# Patient Record
Sex: Female | Born: 1945 | Race: White | Hispanic: No | Marital: Married | Smoking: Never smoker
Health system: Southern US, Community
[De-identification: ages and names within clinical notes are randomized; demographics above are authoritative.]

## PROBLEM LIST (undated history)

## (undated) DIAGNOSIS — I509 Heart failure, unspecified: Secondary | ICD-10-CM

## (undated) DIAGNOSIS — F329 Major depressive disorder, single episode, unspecified: Secondary | ICD-10-CM

## (undated) DIAGNOSIS — E119 Type 2 diabetes mellitus without complications: Secondary | ICD-10-CM

## (undated) DIAGNOSIS — G4730 Sleep apnea, unspecified: Secondary | ICD-10-CM

## (undated) DIAGNOSIS — J449 Chronic obstructive pulmonary disease, unspecified: Secondary | ICD-10-CM

## (undated) DIAGNOSIS — I10 Essential (primary) hypertension: Secondary | ICD-10-CM

## (undated) DIAGNOSIS — F32A Depression, unspecified: Secondary | ICD-10-CM

## (undated) DIAGNOSIS — M1990 Unspecified osteoarthritis, unspecified site: Secondary | ICD-10-CM

## (undated) HISTORY — PX: LAMINECTOMY: SHX219

## (undated) HISTORY — PX: BACK SURGERY: SHX140

---

## 2005-06-09 ENCOUNTER — Inpatient Hospital Stay: Payer: Self-pay | Admitting: Internal Medicine

## 2005-06-15 ENCOUNTER — Other Ambulatory Visit: Payer: Self-pay

## 2005-07-07 ENCOUNTER — Ambulatory Visit: Payer: Self-pay | Admitting: Anesthesiology

## 2011-08-03 ENCOUNTER — Inpatient Hospital Stay: Payer: Self-pay | Admitting: Internal Medicine

## 2011-08-03 LAB — SEDIMENTATION RATE: Erythrocyte Sed Rate: 47 mm/hr — ABNORMAL HIGH (ref 0–30)

## 2011-08-03 LAB — CBC WITH DIFFERENTIAL/PLATELET
Basophil #: 0 10*3/uL (ref 0.0–0.1)
Eosinophil %: 0.5 %
HGB: 11.3 g/dL — ABNORMAL LOW (ref 12.0–16.0)
MCHC: 31.7 g/dL — ABNORMAL LOW (ref 32.0–36.0)
MCV: 89 fL (ref 80–100)
Monocyte #: 0.9 x10 3/mm (ref 0.2–0.9)
Neutrophil #: 15.9 10*3/uL — ABNORMAL HIGH (ref 1.4–6.5)
Neutrophil %: 90.8 %

## 2011-08-03 LAB — COMPREHENSIVE METABOLIC PANEL
Albumin: 2.4 g/dL — ABNORMAL LOW (ref 3.4–5.0)
Alkaline Phosphatase: 123 U/L (ref 50–136)
Anion Gap: 10 (ref 7–16)
BUN: 68 mg/dL — ABNORMAL HIGH (ref 7–18)
Bilirubin,Total: 0.7 mg/dL (ref 0.2–1.0)
Chloride: 104 mmol/L (ref 98–107)
EGFR (Non-African Amer.): 10 — ABNORMAL LOW
Glucose: 138 mg/dL — ABNORMAL HIGH (ref 65–99)
Osmolality: 299 (ref 275–301)
Potassium: 5 mmol/L (ref 3.5–5.1)
Sodium: 139 mmol/L (ref 136–145)

## 2011-08-04 LAB — CBC WITH DIFFERENTIAL/PLATELET
Eosinophil #: 0.2 10*3/uL (ref 0.0–0.7)
Eosinophil %: 1.4 %
HGB: 9.6 g/dL — ABNORMAL LOW (ref 12.0–16.0)
Lymphocyte #: 0.9 10*3/uL — ABNORMAL LOW (ref 1.0–3.6)
MCH: 28 pg (ref 26.0–34.0)
MCV: 88 fL (ref 80–100)
Neutrophil #: 12.1 10*3/uL — ABNORMAL HIGH (ref 1.4–6.5)
Neutrophil %: 86.7 %
Platelet: 103 10*3/uL — ABNORMAL LOW (ref 150–440)
RBC: 3.43 10*6/uL — ABNORMAL LOW (ref 3.80–5.20)
RDW: 16 % — ABNORMAL HIGH (ref 11.5–14.5)

## 2011-08-04 LAB — URINALYSIS, COMPLETE
Bilirubin,UR: NEGATIVE
Glucose,UR: NEGATIVE mg/dL (ref 0–75)
Nitrite: NEGATIVE
Ph: 5 (ref 4.5–8.0)
Squamous Epithelial: 7

## 2011-08-04 LAB — LIPID PANEL
Cholesterol: 69 mg/dL (ref 0–200)
HDL Cholesterol: 12 mg/dL — ABNORMAL LOW (ref 40–60)
VLDL Cholesterol, Calc: 20 mg/dL (ref 5–40)

## 2011-08-04 LAB — BASIC METABOLIC PANEL
BUN: 80 mg/dL — ABNORMAL HIGH (ref 7–18)
Calcium, Total: 8 mg/dL — ABNORMAL LOW (ref 8.5–10.1)
Co2: 26 mmol/L (ref 21–32)
EGFR (African American): 10 — ABNORMAL LOW
Osmolality: 302 (ref 275–301)
Potassium: 5 mmol/L (ref 3.5–5.1)

## 2011-08-05 LAB — CBC WITH DIFFERENTIAL/PLATELET
Basophil #: 0 10*3/uL (ref 0.0–0.1)
Basophil %: 0.1 %
HCT: 30.5 % — ABNORMAL LOW (ref 35.0–47.0)
Lymphocyte %: 6.7 %
MCHC: 32.1 g/dL (ref 32.0–36.0)
Monocyte %: 7.4 %
Neutrophil #: 12.6 10*3/uL — ABNORMAL HIGH (ref 1.4–6.5)
Platelet: 123 10*3/uL — ABNORMAL LOW (ref 150–440)
RBC: 3.51 10*6/uL — ABNORMAL LOW (ref 3.80–5.20)
RDW: 16.1 % — ABNORMAL HIGH (ref 11.5–14.5)
WBC: 14.8 10*3/uL — ABNORMAL HIGH (ref 3.6–11.0)

## 2011-08-05 LAB — BASIC METABOLIC PANEL
EGFR (Non-African Amer.): 11 — ABNORMAL LOW
Glucose: 109 mg/dL — ABNORMAL HIGH (ref 65–99)
Osmolality: 301 (ref 275–301)
Potassium: 5.1 mmol/L (ref 3.5–5.1)

## 2011-08-05 LAB — PROTEIN ELECTROPHORESIS(ARMC)

## 2011-08-06 LAB — BASIC METABOLIC PANEL
Anion Gap: 9 (ref 7–16)
BUN: 61 mg/dL — ABNORMAL HIGH (ref 7–18)
Creatinine: 2.32 mg/dL — ABNORMAL HIGH (ref 0.60–1.30)
EGFR (African American): 24 — ABNORMAL LOW
EGFR (Non-African Amer.): 21 — ABNORMAL LOW
Osmolality: 301 (ref 275–301)

## 2011-08-07 LAB — URINALYSIS, COMPLETE
Ketone: NEGATIVE
Nitrite: NEGATIVE
Ph: 5 (ref 4.5–8.0)
Protein: 30
Specific Gravity: 1.014 (ref 1.003–1.030)
Squamous Epithelial: 3

## 2011-08-07 LAB — CBC WITH DIFFERENTIAL/PLATELET
Basophil #: 0 10*3/uL (ref 0.0–0.1)
Basophil %: 0.2 %
Eosinophil #: 0.1 10*3/uL (ref 0.0–0.7)
Eosinophil %: 1.3 %
HCT: 31.4 % — ABNORMAL LOW (ref 35.0–47.0)
Lymphocyte %: 9.2 %
MCH: 28.1 pg (ref 26.0–34.0)
MCHC: 32.5 g/dL (ref 32.0–36.0)
Monocyte %: 9.1 %
Neutrophil #: 8.5 10*3/uL — ABNORMAL HIGH (ref 1.4–6.5)
Neutrophil %: 80.2 %
Platelet: 164 10*3/uL (ref 150–440)
RDW: 16.2 % — ABNORMAL HIGH (ref 11.5–14.5)

## 2011-08-07 LAB — BASIC METABOLIC PANEL
BUN: 40 mg/dL — ABNORMAL HIGH (ref 7–18)
Chloride: 109 mmol/L — ABNORMAL HIGH (ref 98–107)
Creatinine: 1.55 mg/dL — ABNORMAL HIGH (ref 0.60–1.30)
EGFR (African American): 40 — ABNORMAL LOW
EGFR (Non-African Amer.): 34 — ABNORMAL LOW
Sodium: 143 mmol/L (ref 136–145)

## 2011-08-08 LAB — BASIC METABOLIC PANEL
Anion Gap: 7 (ref 7–16)
BUN: 27 mg/dL — ABNORMAL HIGH (ref 7–18)
Chloride: 108 mmol/L — ABNORMAL HIGH (ref 98–107)
EGFR (Non-African Amer.): 42 — ABNORMAL LOW
Glucose: 122 mg/dL — ABNORMAL HIGH (ref 65–99)
Osmolality: 293 (ref 275–301)
Potassium: 4.9 mmol/L (ref 3.5–5.1)

## 2011-08-09 LAB — URINE CULTURE

## 2011-08-10 LAB — BASIC METABOLIC PANEL
Anion Gap: 5 — ABNORMAL LOW (ref 7–16)
Calcium, Total: 7.9 mg/dL — ABNORMAL LOW (ref 8.5–10.1)
Chloride: 104 mmol/L (ref 98–107)
Creatinine: 1.02 mg/dL (ref 0.60–1.30)
EGFR (African American): >60
EGFR (Non-African Amer.): 57 — ABNORMAL LOW
Potassium: 4.6 mmol/L (ref 3.5–5.1)
Sodium: 141 mmol/L (ref 136–145)

## 2011-08-11 LAB — CBC WITH DIFFERENTIAL/PLATELET
Basophil #: 0.1 10*3/uL (ref 0.0–0.1)
Eosinophil %: 1.5 %
HCT: 30.9 % — ABNORMAL LOW (ref 35.0–47.0)
HGB: 10 g/dL — ABNORMAL LOW (ref 12.0–16.0)
Lymphocyte #: 1.2 10*3/uL (ref 1.0–3.6)
Lymphocyte %: 13.9 %
MCHC: 32.3 g/dL (ref 32.0–36.0)
MCV: 87 fL (ref 80–100)
Monocyte #: 0.6 x10 3/mm (ref 0.2–0.9)
Monocyte %: 6.8 %
Platelet: 143 10*3/uL — ABNORMAL LOW (ref 150–440)
RDW: 15.4 % — ABNORMAL HIGH (ref 11.5–14.5)
WBC: 8.5 10*3/uL (ref 3.6–11.0)

## 2011-08-28 ENCOUNTER — Encounter: Payer: Self-pay | Admitting: Cardiothoracic Surgery

## 2011-09-07 ENCOUNTER — Encounter: Payer: Self-pay | Admitting: Nurse Practitioner

## 2011-09-07 ENCOUNTER — Encounter: Payer: Self-pay | Admitting: Cardiothoracic Surgery

## 2013-10-05 ENCOUNTER — Inpatient Hospital Stay: Payer: Self-pay | Admitting: Internal Medicine

## 2013-10-05 LAB — CBC WITH DIFFERENTIAL/PLATELET
Basophil #: 0 10*3/uL (ref 0.0–0.1)
Basophil %: 0.2 %
Eosinophil #: 0.2 10*3/uL (ref 0.0–0.7)
Eosinophil %: 0.9 %
HCT: 32.9 % — ABNORMAL LOW (ref 35.0–47.0)
HGB: 10.4 g/dL — ABNORMAL LOW (ref 12.0–16.0)
LYMPHS ABS: 0.7 10*3/uL — AB (ref 1.0–3.6)
Lymphocyte %: 3.9 %
MCH: 28 pg (ref 26.0–34.0)
MCHC: 31.8 g/dL — ABNORMAL LOW (ref 32.0–36.0)
MCV: 88 fL (ref 80–100)
MONO ABS: 0.7 x10 3/mm (ref 0.2–0.9)
Monocyte %: 3.8 %
NEUTROS PCT: 91.2 %
Neutrophil #: 16.4 10*3/uL — ABNORMAL HIGH (ref 1.4–6.5)
PLATELETS: 109 10*3/uL — AB (ref 150–440)
RBC: 3.73 10*6/uL — ABNORMAL LOW (ref 3.80–5.20)
RDW: 17.2 % — AB (ref 11.5–14.5)
WBC: 18 10*3/uL — ABNORMAL HIGH (ref 3.6–11.0)

## 2013-10-05 LAB — URINALYSIS, COMPLETE
Bilirubin,UR: NEGATIVE
Glucose,UR: NEGATIVE mg/dL (ref 0–75)
Ketone: NEGATIVE
NITRITE: NEGATIVE
PH: 5 (ref 4.5–8.0)
Protein: 30
Specific Gravity: 1.023 (ref 1.003–1.030)
Squamous Epithelial: 59
WBC UR: 24 /HPF (ref 0–5)

## 2013-10-05 LAB — PROTIME-INR
INR: 1
Prothrombin Time: 13.4 secs (ref 11.5–14.7)

## 2013-10-05 LAB — COMPREHENSIVE METABOLIC PANEL
ALBUMIN: 1.8 g/dL — AB (ref 3.4–5.0)
ANION GAP: 9 (ref 7–16)
AST: 57 U/L — AB (ref 15–37)
Alkaline Phosphatase: 86 U/L
BUN: 63 mg/dL — ABNORMAL HIGH (ref 7–18)
Bilirubin,Total: 0.3 mg/dL (ref 0.2–1.0)
CALCIUM: 7.6 mg/dL — AB (ref 8.5–10.1)
Chloride: 103 mmol/L (ref 98–107)
Co2: 26 mmol/L (ref 21–32)
Creatinine: 5.65 mg/dL — ABNORMAL HIGH (ref 0.60–1.30)
EGFR (African American): 8 — ABNORMAL LOW
GFR CALC NON AF AMER: 7 — AB
GLUCOSE: 137 mg/dL — AB (ref 65–99)
Osmolality: 296 (ref 275–301)
Potassium: 5 mmol/L (ref 3.5–5.1)
SGPT (ALT): 31 U/L
Sodium: 138 mmol/L (ref 136–145)
Total Protein: 6.2 g/dL — ABNORMAL LOW (ref 6.4–8.2)

## 2013-10-05 LAB — TROPONIN I

## 2013-10-06 LAB — COMPREHENSIVE METABOLIC PANEL
ANION GAP: 8 (ref 7–16)
Albumin: 1.7 g/dL — ABNORMAL LOW (ref 3.4–5.0)
Alkaline Phosphatase: 92 U/L
BUN: 66 mg/dL — ABNORMAL HIGH (ref 7–18)
Bilirubin,Total: 0.3 mg/dL (ref 0.2–1.0)
Calcium, Total: 7.3 mg/dL — ABNORMAL LOW (ref 8.5–10.1)
Chloride: 101 mmol/L (ref 98–107)
Co2: 25 mmol/L (ref 21–32)
Creatinine: 5.36 mg/dL — ABNORMAL HIGH (ref 0.60–1.30)
EGFR (African American): 9 — ABNORMAL LOW
GFR CALC NON AF AMER: 7 — AB
GLUCOSE: 179 mg/dL — AB (ref 65–99)
OSMOLALITY: 292 (ref 275–301)
POTASSIUM: 4.6 mmol/L (ref 3.5–5.1)
SGOT(AST): 45 U/L — ABNORMAL HIGH (ref 15–37)
SGPT (ALT): 30 U/L
Sodium: 134 mmol/L — ABNORMAL LOW (ref 136–145)
TOTAL PROTEIN: 6.1 g/dL — AB (ref 6.4–8.2)

## 2013-10-06 LAB — CBC WITH DIFFERENTIAL/PLATELET
Basophil #: 0.1 10*3/uL (ref 0.0–0.1)
Basophil %: 0.3 %
EOS ABS: 0.4 10*3/uL (ref 0.0–0.7)
Eosinophil %: 1.8 %
HCT: 32.3 % — AB (ref 35.0–47.0)
HGB: 10.3 g/dL — AB (ref 12.0–16.0)
Lymphocyte #: 1.1 10*3/uL (ref 1.0–3.6)
Lymphocyte %: 5.7 %
MCH: 28.1 pg (ref 26.0–34.0)
MCHC: 31.8 g/dL — ABNORMAL LOW (ref 32.0–36.0)
MCV: 88 fL (ref 80–100)
MONO ABS: 0.8 x10 3/mm (ref 0.2–0.9)
Monocyte %: 3.9 %
Neutrophil #: 17.7 10*3/uL — ABNORMAL HIGH (ref 1.4–6.5)
Neutrophil %: 88.3 %
PLATELETS: 119 10*3/uL — AB (ref 150–440)
RBC: 3.67 10*6/uL — ABNORMAL LOW (ref 3.80–5.20)
RDW: 17.4 % — ABNORMAL HIGH (ref 11.5–14.5)
WBC: 20 10*3/uL — ABNORMAL HIGH (ref 3.6–11.0)

## 2013-10-06 LAB — HEMOGLOBIN A1C: HEMOGLOBIN A1C: 5.9 % (ref 4.2–6.3)

## 2013-10-07 ENCOUNTER — Ambulatory Visit: Payer: Self-pay | Admitting: Internal Medicine

## 2013-10-07 ENCOUNTER — Ambulatory Visit
Admit: 2013-10-07 | Disposition: A | Discharge status: Home / Self Care | Payer: Self-pay | Attending: Nurse Practitioner | Admitting: Nurse Practitioner

## 2013-10-07 LAB — CBC WITH DIFFERENTIAL/PLATELET
Basophil #: 0.1 10*3/uL (ref 0.0–0.1)
Basophil %: 0.3 %
EOS ABS: 0.1 10*3/uL (ref 0.0–0.7)
EOS PCT: 0.4 %
HCT: 35.8 % (ref 35.0–47.0)
HGB: 11 g/dL — ABNORMAL LOW (ref 12.0–16.0)
LYMPHS PCT: 5.2 %
Lymphocyte #: 0.8 10*3/uL — ABNORMAL LOW (ref 1.0–3.6)
MCH: 27.6 pg (ref 26.0–34.0)
MCHC: 30.8 g/dL — ABNORMAL LOW (ref 32.0–36.0)
MCV: 90 fL (ref 80–100)
MONOS PCT: 4.4 %
Monocyte #: 0.7 x10 3/mm (ref 0.2–0.9)
Neutrophil #: 14.6 10*3/uL — ABNORMAL HIGH (ref 1.4–6.5)
Neutrophil %: 89.7 %
PLATELETS: 108 10*3/uL — AB (ref 150–440)
RBC: 3.99 10*6/uL (ref 3.80–5.20)
RDW: 17.2 % — ABNORMAL HIGH (ref 11.5–14.5)
WBC: 16.3 10*3/uL — AB (ref 3.6–11.0)

## 2013-10-07 LAB — BASIC METABOLIC PANEL
Anion Gap: 8 (ref 7–16)
BUN: 69 mg/dL — AB (ref 7–18)
CHLORIDE: 100 mmol/L (ref 98–107)
CO2: 24 mmol/L (ref 21–32)
Calcium, Total: 7.2 mg/dL — ABNORMAL LOW (ref 8.5–10.1)
Creatinine: 5.46 mg/dL — ABNORMAL HIGH (ref 0.60–1.30)
EGFR (Non-African Amer.): 7 — ABNORMAL LOW
GFR CALC AF AMER: 8 — AB
Glucose: 145 mg/dL — ABNORMAL HIGH (ref 65–99)
Osmolality: 287 (ref 275–301)
Potassium: 4.9 mmol/L (ref 3.5–5.1)
Sodium: 132 mmol/L — ABNORMAL LOW (ref 136–145)

## 2013-10-07 LAB — APTT: ACTIVATED PTT: 28.1 s (ref 23.6–35.9)

## 2013-10-07 LAB — PROTIME-INR
INR: 1
PROTHROMBIN TIME: 13.3 s (ref 11.5–14.7)

## 2013-10-07 LAB — URINE CULTURE

## 2013-10-07 LAB — HEPARIN LEVEL (UNFRACTIONATED): Anti-Xa(Unfractionated): 0.22 IU/mL — ABNORMAL LOW (ref 0.30–0.70)

## 2013-10-07 LAB — TSH: THYROID STIMULATING HORM: 1.93 u[IU]/mL

## 2013-10-08 LAB — BASIC METABOLIC PANEL
ANION GAP: 12 (ref 7–16)
BUN: 70 mg/dL — ABNORMAL HIGH (ref 7–18)
CALCIUM: 6.9 mg/dL — AB (ref 8.5–10.1)
CHLORIDE: 99 mmol/L (ref 98–107)
CO2: 19 mmol/L — AB (ref 21–32)
CREATININE: 5.35 mg/dL — AB (ref 0.60–1.30)
EGFR (Non-African Amer.): 8 — ABNORMAL LOW
GFR CALC AF AMER: 9 — AB
GLUCOSE: 176 mg/dL — AB (ref 65–99)
OSMOLALITY: 286 (ref 275–301)
Potassium: 5 mmol/L (ref 3.5–5.1)
Sodium: 130 mmol/L — ABNORMAL LOW (ref 136–145)

## 2013-10-08 LAB — CBC WITH DIFFERENTIAL/PLATELET
BASOS ABS: 0 10*3/uL (ref 0.0–0.1)
Basophil %: 0.5 %
EOS ABS: 0.1 10*3/uL (ref 0.0–0.7)
Eosinophil %: 0.8 %
HCT: 31.8 % — ABNORMAL LOW (ref 35.0–47.0)
HGB: 10.4 g/dL — ABNORMAL LOW (ref 12.0–16.0)
LYMPHS ABS: 0.8 10*3/uL — AB (ref 1.0–3.6)
Lymphocyte %: 7.8 %
MCH: 28.5 pg (ref 26.0–34.0)
MCHC: 32.9 g/dL (ref 32.0–36.0)
MCV: 87 fL (ref 80–100)
MONO ABS: 0.4 x10 3/mm (ref 0.2–0.9)
MONOS PCT: 4.4 %
NEUTROS ABS: 8.6 10*3/uL — AB (ref 1.4–6.5)
Neutrophil %: 86.5 %
PLATELETS: 110 10*3/uL — AB (ref 150–440)
RBC: 3.66 10*6/uL — ABNORMAL LOW (ref 3.80–5.20)
RDW: 16.9 % — AB (ref 11.5–14.5)
WBC: 9.9 10*3/uL (ref 3.6–11.0)

## 2013-10-08 LAB — MAGNESIUM
MAGNESIUM: 1.6 mg/dL — AB
Magnesium: 2 mg/dL

## 2013-10-08 LAB — HEPARIN LEVEL (UNFRACTIONATED)
Anti-Xa(Unfractionated): <0.1 IU/mL — ABNORMAL LOW (ref 0.30–0.70)
Anti-Xa(Unfractionated): 0.16 IU/mL — ABNORMAL LOW (ref 0.30–0.70)

## 2013-10-09 LAB — CBC WITH DIFFERENTIAL/PLATELET
BASOS PCT: 0.3 %
Basophil #: 0 10*3/uL (ref 0.0–0.1)
Eosinophil #: 0.1 10*3/uL (ref 0.0–0.7)
Eosinophil %: 1.4 %
HCT: 30.5 % — AB (ref 35.0–47.0)
HGB: 10.2 g/dL — ABNORMAL LOW (ref 12.0–16.0)
LYMPHS ABS: 0.8 10*3/uL — AB (ref 1.0–3.6)
LYMPHS PCT: 10.3 %
MCH: 28.4 pg (ref 26.0–34.0)
MCHC: 33.3 g/dL (ref 32.0–36.0)
MCV: 85 fL (ref 80–100)
MONO ABS: 0.3 x10 3/mm (ref 0.2–0.9)
Monocyte %: 3.8 %
Neutrophil #: 6.8 10*3/uL — ABNORMAL HIGH (ref 1.4–6.5)
Neutrophil %: 84.2 %
Platelet: 118 10*3/uL — ABNORMAL LOW (ref 150–440)
RBC: 3.58 10*6/uL — AB (ref 3.80–5.20)
RDW: 16.6 % — ABNORMAL HIGH (ref 11.5–14.5)
WBC: 8 10*3/uL (ref 3.6–11.0)

## 2013-10-09 LAB — BASIC METABOLIC PANEL
ANION GAP: 12 (ref 7–16)
BUN: 68 mg/dL — AB (ref 7–18)
CREATININE: 4.01 mg/dL — AB (ref 0.60–1.30)
Calcium, Total: 7 mg/dL — CL (ref 8.5–10.1)
Chloride: 99 mmol/L (ref 98–107)
Co2: 17 mmol/L — ABNORMAL LOW (ref 21–32)
GFR CALC AF AMER: 12 — AB
GFR CALC NON AF AMER: 11 — AB
Glucose: 154 mg/dL — ABNORMAL HIGH (ref 65–99)
Osmolality: 280 (ref 275–301)
POTASSIUM: 4.4 mmol/L (ref 3.5–5.1)
Sodium: 128 mmol/L — ABNORMAL LOW (ref 136–145)

## 2013-10-09 LAB — PHOSPHORUS: Phosphorus: 4.2 mg/dL (ref 2.5–4.9)

## 2013-10-09 LAB — HEPARIN LEVEL (UNFRACTIONATED)
Anti-Xa(Unfractionated): 2.08 IU/mL — ABNORMAL HIGH (ref 0.30–0.70)
Anti-Xa(Unfractionated): <0.1 IU/mL — ABNORMAL LOW (ref 0.30–0.70)

## 2013-10-10 LAB — CBC WITH DIFFERENTIAL/PLATELET
BASOS PCT: 0.3 %
Basophil #: 0 10*3/uL (ref 0.0–0.1)
EOS PCT: 1 %
Eosinophil #: 0.1 10*3/uL (ref 0.0–0.7)
HCT: 29.1 % — AB (ref 35.0–47.0)
HGB: 9.6 g/dL — AB (ref 12.0–16.0)
LYMPHS PCT: 6.4 %
Lymphocyte #: 0.6 10*3/uL — ABNORMAL LOW (ref 1.0–3.6)
MCH: 28 pg (ref 26.0–34.0)
MCHC: 32.9 g/dL (ref 32.0–36.0)
MCV: 85 fL (ref 80–100)
MONO ABS: 0.4 x10 3/mm (ref 0.2–0.9)
MONOS PCT: 4.3 %
Neutrophil #: 7.6 10*3/uL — ABNORMAL HIGH (ref 1.4–6.5)
Neutrophil %: 88 %
Platelet: 126 10*3/uL — ABNORMAL LOW (ref 150–440)
RBC: 3.41 10*6/uL — ABNORMAL LOW (ref 3.80–5.20)
RDW: 16.3 % — AB (ref 11.5–14.5)
WBC: 8.7 10*3/uL (ref 3.6–11.0)

## 2013-10-10 LAB — BASIC METABOLIC PANEL
Anion Gap: 11 (ref 7–16)
BUN: 59 mg/dL — ABNORMAL HIGH (ref 7–18)
CHLORIDE: 97 mmol/L — AB (ref 98–107)
CREATININE: 2.24 mg/dL — AB (ref 0.60–1.30)
Calcium, Total: 6.7 mg/dL — CL (ref 8.5–10.1)
Co2: 20 mmol/L — ABNORMAL LOW (ref 21–32)
EGFR (African American): 25 — ABNORMAL LOW
EGFR (Non-African Amer.): 22 — ABNORMAL LOW
Glucose: 223 mg/dL — ABNORMAL HIGH (ref 65–99)
Osmolality: 281 (ref 275–301)
POTASSIUM: 4.8 mmol/L (ref 3.5–5.1)
SODIUM: 128 mmol/L — AB (ref 136–145)

## 2013-10-10 LAB — CULTURE, BLOOD (SINGLE)

## 2013-10-10 LAB — PHOSPHORUS: Phosphorus: 3.6 mg/dL (ref 2.5–4.9)

## 2013-10-10 LAB — HEPARIN LEVEL (UNFRACTIONATED)
ANTI-XA(UNFRACTIONATED): 0.27 [IU]/mL — AB (ref 0.30–0.70)
Anti-Xa(Unfractionated): 0.18 IU/mL — ABNORMAL LOW (ref 0.30–0.70)
Anti-Xa(Unfractionated): >3.6 IU/mL — ABNORMAL HIGH (ref 0.30–0.70)

## 2013-10-10 LAB — MAGNESIUM: Magnesium: 1.5 mg/dL — ABNORMAL LOW

## 2013-10-11 LAB — COMPREHENSIVE METABOLIC PANEL
ANION GAP: 7 (ref 7–16)
Albumin: 1.5 g/dL — ABNORMAL LOW (ref 3.4–5.0)
Alkaline Phosphatase: 72 U/L
BUN: 44 mg/dL — AB (ref 7–18)
Bilirubin,Total: 0.5 mg/dL (ref 0.2–1.0)
CALCIUM: 7.3 mg/dL — AB (ref 8.5–10.1)
CO2: 28 mmol/L (ref 21–32)
Chloride: 105 mmol/L (ref 98–107)
Creatinine: 1.73 mg/dL — ABNORMAL HIGH (ref 0.60–1.30)
EGFR (African American): 34 — ABNORMAL LOW
EGFR (Non-African Amer.): 29 — ABNORMAL LOW
GLUCOSE: 95 mg/dL (ref 65–99)
OSMOLALITY: 290 (ref 275–301)
Potassium: 4.3 mmol/L (ref 3.5–5.1)
SGOT(AST): 18 U/L (ref 15–37)
SGPT (ALT): 15 U/L
Sodium: 140 mmol/L (ref 136–145)
Total Protein: 6 g/dL — ABNORMAL LOW (ref 6.4–8.2)

## 2013-10-11 LAB — CBC WITH DIFFERENTIAL/PLATELET
BASOS PCT: 0.3 %
Basophil #: 0 10*3/uL (ref 0.0–0.1)
Eosinophil #: 0.1 10*3/uL (ref 0.0–0.7)
Eosinophil %: 1.4 %
HCT: 30.8 % — ABNORMAL LOW (ref 35.0–47.0)
HGB: 10 g/dL — ABNORMAL LOW (ref 12.0–16.0)
LYMPHS ABS: 0.9 10*3/uL — AB (ref 1.0–3.6)
Lymphocyte %: 9.8 %
MCH: 28 pg (ref 26.0–34.0)
MCHC: 32.5 g/dL (ref 32.0–36.0)
MCV: 86 fL (ref 80–100)
Monocyte #: 0.7 x10 3/mm (ref 0.2–0.9)
Monocyte %: 7.9 %
NEUTROS PCT: 80.6 %
Neutrophil #: 7.2 10*3/uL — ABNORMAL HIGH (ref 1.4–6.5)
PLATELETS: 140 10*3/uL — AB (ref 150–440)
RBC: 3.56 10*6/uL — AB (ref 3.80–5.20)
RDW: 16.4 % — AB (ref 11.5–14.5)
WBC: 8.6 10*3/uL (ref 3.6–11.0)

## 2013-10-11 LAB — HEPARIN LEVEL (UNFRACTIONATED): Anti-Xa(Unfractionated): 0.43 IU/mL (ref 0.30–0.70)

## 2013-10-12 LAB — BASIC METABOLIC PANEL
Anion Gap: 6 — ABNORMAL LOW (ref 7–16)
BUN: 36 mg/dL — ABNORMAL HIGH (ref 7–18)
Calcium, Total: 7.9 mg/dL — ABNORMAL LOW (ref 8.5–10.1)
Chloride: 107 mmol/L (ref 98–107)
Co2: 29 mmol/L (ref 21–32)
Creatinine: 1.26 mg/dL (ref 0.60–1.30)
EGFR (African American): 50 — ABNORMAL LOW
EGFR (Non-African Amer.): 43 — ABNORMAL LOW
Glucose: 109 mg/dL — ABNORMAL HIGH (ref 65–99)
Osmolality: 292 (ref 275–301)
Potassium: 4.8 mmol/L (ref 3.5–5.1)
Sodium: 142 mmol/L (ref 136–145)

## 2013-10-12 LAB — HEPARIN LEVEL (UNFRACTIONATED): Anti-Xa(Unfractionated): 0.64 IU/mL (ref 0.30–0.70)

## 2013-10-13 LAB — CBC WITH DIFFERENTIAL/PLATELET
Basophil #: 0 10*3/uL (ref 0.0–0.1)
Basophil %: 0.5 %
EOS PCT: 1.4 %
Eosinophil #: 0.1 10*3/uL (ref 0.0–0.7)
HCT: 32 % — ABNORMAL LOW (ref 35.0–47.0)
HGB: 10.3 g/dL — AB (ref 12.0–16.0)
LYMPHS PCT: 9.7 %
Lymphocyte #: 0.8 10*3/uL — ABNORMAL LOW (ref 1.0–3.6)
MCH: 28.3 pg (ref 26.0–34.0)
MCHC: 32.3 g/dL (ref 32.0–36.0)
MCV: 88 fL (ref 80–100)
Monocyte #: 0.7 x10 3/mm (ref 0.2–0.9)
Monocyte %: 8.6 %
NEUTROS PCT: 79.8 %
Neutrophil #: 6.9 10*3/uL — ABNORMAL HIGH (ref 1.4–6.5)
Platelet: 145 10*3/uL — ABNORMAL LOW (ref 150–440)
RBC: 3.66 10*6/uL — AB (ref 3.80–5.20)
RDW: 16.6 % — ABNORMAL HIGH (ref 11.5–14.5)
WBC: 8.7 10*3/uL (ref 3.6–11.0)

## 2013-10-13 LAB — CLOSTRIDIUM DIFFICILE(ARMC)

## 2013-10-15 LAB — BASIC METABOLIC PANEL
ANION GAP: 7 (ref 7–16)
BUN: 35 mg/dL — ABNORMAL HIGH (ref 7–18)
CHLORIDE: 103 mmol/L (ref 98–107)
Calcium, Total: 8.1 mg/dL — ABNORMAL LOW (ref 8.5–10.1)
Co2: 34 mmol/L — ABNORMAL HIGH (ref 21–32)
Creatinine: 1.11 mg/dL (ref 0.60–1.30)
GFR CALC AF AMER: 58 — AB
GFR CALC NON AF AMER: 50 — AB
Glucose: 185 mg/dL — ABNORMAL HIGH (ref 65–99)
OSMOLALITY: 300 (ref 275–301)
Potassium: 5.8 mmol/L — ABNORMAL HIGH (ref 3.5–5.1)
Sodium: 144 mmol/L (ref 136–145)

## 2013-10-15 LAB — CBC WITH DIFFERENTIAL/PLATELET
BASOS PCT: 0.3 %
Basophil #: 0 10*3/uL (ref 0.0–0.1)
Eosinophil #: 0 10*3/uL (ref 0.0–0.7)
Eosinophil %: 0 %
HCT: 32 % — ABNORMAL LOW (ref 35.0–47.0)
HGB: 10.3 g/dL — ABNORMAL LOW (ref 12.0–16.0)
Lymphocyte #: 0.5 10*3/uL — ABNORMAL LOW (ref 1.0–3.6)
Lymphocyte %: 6.2 %
MCH: 28.1 pg (ref 26.0–34.0)
MCHC: 32 g/dL (ref 32.0–36.0)
MCV: 88 fL (ref 80–100)
MONO ABS: 0.1 x10 3/mm — AB (ref 0.2–0.9)
MONOS PCT: 1.8 %
NEUTROS ABS: 7.2 10*3/uL — AB (ref 1.4–6.5)
Neutrophil %: 91.7 %
Platelet: 92 10*3/uL — ABNORMAL LOW (ref 150–440)
RBC: 3.65 10*6/uL — AB (ref 3.80–5.20)
RDW: 16.3 % — ABNORMAL HIGH (ref 11.5–14.5)
WBC: 7.8 10*3/uL (ref 3.6–11.0)

## 2013-10-16 LAB — BASIC METABOLIC PANEL
Anion Gap: 6 — ABNORMAL LOW (ref 7–16)
BUN: 42 mg/dL — AB (ref 7–18)
CO2: 36 mmol/L — AB (ref 21–32)
CREATININE: 1.11 mg/dL (ref 0.60–1.30)
Calcium, Total: 8.1 mg/dL — ABNORMAL LOW (ref 8.5–10.1)
Chloride: 104 mmol/L (ref 98–107)
GFR CALC AF AMER: 58 — AB
GFR CALC NON AF AMER: 50 — AB
Glucose: 98 mg/dL (ref 65–99)
OSMOLALITY: 301 (ref 275–301)
POTASSIUM: 4.7 mmol/L (ref 3.5–5.1)
Sodium: 146 mmol/L — ABNORMAL HIGH (ref 136–145)

## 2013-10-18 LAB — BASIC METABOLIC PANEL
ANION GAP: 3 — AB (ref 7–16)
BUN: 34 mg/dL — ABNORMAL HIGH (ref 7–18)
CALCIUM: 7.8 mg/dL — AB (ref 8.5–10.1)
CHLORIDE: 101 mmol/L (ref 98–107)
Co2: 37 mmol/L — ABNORMAL HIGH (ref 21–32)
Creatinine: 1.13 mg/dL (ref 0.60–1.30)
EGFR (African American): 57 — ABNORMAL LOW
GFR CALC NON AF AMER: 49 — AB
Glucose: 96 mg/dL (ref 65–99)
Osmolality: 289 (ref 275–301)
Potassium: 5.4 mmol/L — ABNORMAL HIGH (ref 3.5–5.1)
Sodium: 141 mmol/L (ref 136–145)

## 2013-10-18 LAB — HEMOGLOBIN: HGB: 9.9 g/dL — AB (ref 12.0–16.0)

## 2013-11-07 ENCOUNTER — Ambulatory Visit: Payer: Self-pay | Admitting: Internal Medicine

## 2013-11-07 ENCOUNTER — Ambulatory Visit
Admit: 2013-11-07 | Disposition: A | Discharge status: Home / Self Care | Payer: Self-pay | Attending: Nurse Practitioner | Admitting: Nurse Practitioner

## 2014-06-30 NOTE — H&P (Signed)
PATIENT NAME:  Eileen [name], Eileen [name] MR#:  [id number] DATE OF BIRTH:  02/16/1946  DATE OF ADMISSION:  10/05/2013  PRIMARY CARE PHYSICIAN:  Dr. Jeananne Rama.   CHIEF COMPLAINT: Accidental fall and  "I thought I broke my ribs."   This is a 69 year old morbidly obese female with a history of OSA, not compliant with CPAP; chronic lower extremity edema, congestive heart failure, chronic kidney disease, who presents with the above complaint. The patient had 2 falls since yesterday. Both times, she accidentally tripped over her walker and she fell. EMS was called, as she was unable to get up. She thought she broke her ribs, so she came here for further evaluation. In the ER, she actually was noted to be significantly hypotensive with systolic blood pressures in the 70s. She is alert, oriented, but her CO2 was 60, so the ER physician had put her on a BiPAP machine as well.  She was started on Levaquin by the ER physician for right lower extremity cellulitis. She is also complaining of 2 weeks of fever, chills and weakness. She has had decreased urine output over the past 2 days.   REVIEW OF SYSTEMS: CONSTITUTIONAL: Positive fever, fatigue and weakness.  EYES:  There is no blurred  or double vision.  Positive cataracts. EAR, NOSE, THROAT: No ear pain. Positive snoring. No postnasal drip or allergies.  RESPIRATORY: No cough, wheezing, hemoptysis. No shortness of breath. Positive history of OSA.    CARDIOVASCULAR:  No chest pain, orthopnea. Positive lower extremity edema, which is chronic. No new changes. No palpitations, syncope.  GASTROINTESTINAL: Some nausea this morning. No vomiting, diarrhea, abdominal pain, melena or ulcers.  GENITOURINARY:  She has had decreased urine output. No dysuria or hematuria.  ENDOCRINE:  No polyuria or polydipsia.  HEMATOLOGIC AND LYMPHATICS:  Positive easy bleeding.  SKIN:  She had got a right lower extremity cellulitis. She has got some redness underneath her breasts as well and  scaly skin on her lower extremities.  NEUROLOGIC:  No history of CVA, TIA or seizures.  PSYCHIATRIC: No history of anxiety or depression.   PAST MEDICAL HISTORY: 1.  Morbid obesity.  2.  Hypertension  3.  GERD. 4.  History of COPD.  5.  History of CHF.  We are still obtaining the medication list.    ALLERGIES: PENICILLIN, DARVON, BETADINE AND ULTRAM.   FAMILY HISTORY: Positive for CAD. Her father died at age 70, mother of CAD at 57.   SOCIAL HISTORY: No tobacco, alcohol or drug use.   PAST SURGICAL HISTORY:  1.  Back surgery.  2.  Cataract surgery.    PHYSICAL EXAMINATION: VITAL SIGNS: Temperature is 99.8, pulse 73, respirations 20, blood pressure is 80/47 after 2 liters of normal saline, 100% on BiPAP.  GENERAL: The patient is alert, oriented, wearing a BiPAP machine, but does not appear to be in any acute distress, speaking in full sentences.  HEENT:  Head is atraumatic. Pupils are round and reactive. Sclerae anicteric. Mucous membranes were not inspected as the patient has a BiPAP machine on.  NECK: Very short. Hard to appreciate any kind of enlarged thyroid or JVD.   CARDIOVASCULAR:  Very distant heart sounds. No audible murmurs, gallops or rubs. Regular rate and rhythm.  ABDOMEN: Morbidly obese. Hard to palpate hepatosplenomegaly due to body habitus. No rebound or guarding. Hypoactive bowel sounds.  LUNGS: I only was able to hear anteriorly. They were clear to auscultation without crackles, rales, rhonchi or wheezing. Normal chest expansion.  EXTREMITIES: She  has 5+ edema bilaterally. She does not have any clubbing or cyanosis.  SKIN:  She has got a right lower extremity cellulitis, which starts at the toes and goes all the way up to her groin. It is very tender and hot. Other skin changes: She has got some redness under her breasts as well, and her left thigh is thick and scaly.     NEUROLOGIC: Cranial nerves II through XII are grossly intact. No focal deficits.    LABORATORIES: White blood cells 18, hemoglobin 10.4, hematocrit 33, platelets are 109. Sodium 138, potassium 5.0, chloride 102, bicarb 26, BUN 63, creatinine 5.65. Glucose is 137. Alk phos 86, ALT 31, AST 57, total protein 6.2. Albumin is 1.8. Troponin less than 0.02. INR is 1.0. Urinalysis shows 3+ bacteria with trace LCE and 24 white blood cells. PH is 7.27. PCO2 is 60, pO2 of 59. Lactic acid is 1.40.   Chest x-ray shows low lung volumes, nothing acute.   EKG is sinus rhythm without any ST elevations or depressions.   ASSESSMENT AND PLAN: This is a 69 year old morbidly obese female with a history of obstructive sleep apnea, not compliant with CPAP; chronic lower extremity edema, history of congestive heart failure, likely diastolic in nature, who presents after a mechanical fall, subsequently noted to have septic shock in the ER.  1.  Septic shock; likely source is her right lower extremity. She also has mild urinary tract infection. She has had similar hospitalization in 2013. Due to her renal failure, I am going to avoid vancomycin, but I will use linezolid to cover for MRSA and Invanz. SHE DOES HAVE A PENICILLIN ALLERGY. Blood cultures and urine culture have been ordered in the ER. She remains hypotensive. I have spoken with the ER physician about the need for a central line and to start Levophed. The ER physician has spoken with surgery, who will likely place the central line, which is needed for her septic shock. Continue aggressive management as indicated above.  2.  Acute renal failure, likely acute tubular necrosis from septic shock. We are obtaining her medication list, as she may be also on nephrotoxic agents. She was saying that she was on naproxen 1500 mg, but apparently this was reduced recently. In any case, I suspect that her creatinine may increase tomorrow and it will subsequently peak and then go down. We are going to monitor her I's and O's, daily weights. I will consult nephrology,  who has seen the patient in the past, and she is a patient of Dr. Juleen China. Hold any nephrotoxic agents, provide IV fluids,  monitor creatinine closely. No kidney  ultrasound yet, but if the creatinine continues to increase, or if she has signs of decreasing urine output, we will obtain a kidney  ultrasound.   3.  Leukocytosis secondary to sepsis from her right lower extremity edema. We will monitor.  4. Right lower extremity cellulitis. The patient has a raging cellulitis of her right lower extremity. She has chronic changes underneath her cellulitis, but it is quite obvious this is cellulitis. She is on broad-spectrum antibiotics as mentioned in problem #1. We will follow up closely. I also ordered Doppler to make sure she does not have a DVT. 5.  Obstructive sleep apnea.  The patient and is not compliant with her CPAP. We will try CPAP while she is here. She was placed on BiPAP here for a pCO2 of 60. I suspect she chronically runs high pCO2. She is alert, oriented, not complaining any  respiratory issues at this time. I do not feel the need to use a CPAP machine.  6.  Congestive heart failure, likely diastolic. I do not find any echocardiograms in the computer, but in any case, her congestive heart failure seems to be stable.   7.  History of chronic obstructive pulmonary disease; this also seems to be stable at this time.   The patient is FULL CODE STATUS.   The patient is critically ill. This was discussed with the patient due to her multiorgan failure.   CRITICAL CARE TIME SPENT: Approximately 50 minutes    ____________________________ Bradlee Heitman P. Benjie Karvonen, MD spm:dmm D: 10/05/2013 18:33:00 ET T: 10/05/2013 19:28:04 ET JOB#: [id number]  cc: Guadalupe Maple, MD Dalyla Chui P. Benjie Karvonen, MD, <Dictator> Donell Beers Emojean Gertz MD ELECTRONICALLY SIGNED 10/06/2013 10:29

## 2014-06-30 NOTE — Discharge Summary (Signed)
Dates of Admission and Diagnosis:  Date of Admission 05-Oct-2013   Date of Discharge 19-Oct-2013   Admitting Diagnosis cellulitis, sepsis   Final Diagnosis Ac on ch respi failure due to Diastolic CHF Ac on Ch diastolic CHF Sepsis due to Lower Extrimity cellulitis and Uti Morbid obasity Ac renal failure Hyponatremia new onset A fib.    Chief Complaint/History of Present Illness a 69 year old morbidly obese female with a history of OSA, not compliant with CPAP; chronic lower extremity edema, congestive heart failure, chronic kidney disease, who presents with the above complaint. The patient had 2 falls since yesterday. Both times, she accidentally tripped over her walker and she fell. EMS was called, as she was unable to get up. She thought she broke her ribs, so she came here for further evaluation. In the ER, she actually was noted to be significantly hypotensive with systolic blood pressures in the 70s. She is alert, oriented, but her CO2 was 60, so the ER physician had put her on a BiPAP machine as well.  She was started on Levaquin by the ER physician for right lower extremity cellulitis. She is also complaining of 2 weeks of fever, chills and weakness. She has had decreased urine output over the past 2 days.   Allergies:  Ultram: Other  Penicillin: Unknown  Darvon: Unknown  Betadine: Unknown  Hepatic:  30-Jul-15 17:11   Bilirubin, Total 0.3  Alkaline Phosphatase 86 (46-116 NOTE: New Reference Range 09/26/13)  SGPT (ALT) 31 (14-63 NOTE: New Reference Range 09/26/13)  SGOT (AST)  57  Total Protein, Serum  6.2  Albumin, Serum  1.8  05-Aug-15 02:52   Bilirubin, Total 0.5  Alkaline Phosphatase 72 (46-116 NOTE: New Reference Range 09/26/13)  SGPT (ALT) 15 (14-63 NOTE: New Reference Range 09/26/13)  SGOT (AST) 18  Total Protein, Serum  6.0  Albumin, Serum  1.5  Routine Micro:  30-Jul-15 16:40   Micro Text Report BLOOD CULTURE   COMMENT                   NO GROWTH  AEROBICALLY/ANAEROBICALLY IN 5 DAYS   ANTIBIOTIC                       Culture Comment NO GROWTH AEROBICALLY/ANAEROBICALLY IN 5 DAYS  Result(s) reported on 10 Oct 2013 at 05:00PM.    17:10   Micro Text Report URINE CULTURE   ORGANISM 1                >100,000 CFU/ML ESCHERICHIA COLI   COMMENT                   WITH MIXED-BACTERIAL-ORGANISMS   ANTIBIOTIC                    ORG#1     AMPICILLIN                    R         CEFAZOLIN S         CEFOXITIN                     S         CEFTRIAXONE                   S         CIPROFLOXACIN  R         ERTAPENEM                     S         GENTAMICIN                    S         IMIPENEM                      S      LEVOFLOXACIN                  R         NITROFURANTOIN                S         TRIMETHOPRIM/SULFAMETHOXAZOLE S  Culture Comment WITH MIXED-BACTERIAL-ORGANISMS  Result(s) reported on 07 Oct 2013 at 07:56AM.  Organism Name ESCHERICHIA COLI  Organism Quantity >100,000 CFU/ML  Nitrofurantoin Sensitivity S  Cefazolin Sensitivity S  Ampicillin Sensitivity R  Ceftriaxone Sensitivity S  Ciprofloxacin Sensitivity R  Gentamicin Sensitivity S  Imipenem Sensitivity S  Levofloxacin Sensitivity R  Trimethoprim/Sulfamethoxazole Sensitivty S  Ertapenem Sensitivity S  Cefoxitin Sensitivity S  Specimen Source INDWELLING CATHETER  Organism 1 >100,000 CFU/ML ESCHERICHIA COLI    17:11   Micro Text Report BLOOD CULTURE   COMMENT                   NO GROWTH AEROBICALLY/ANAEROBICALLY IN 5 DAYS   ANTIBIOTIC                       Culture Comment NO GROWTH AEROBICALLY/ANAEROBICALLY IN 5 DAYS  Result(s) reported on 10 Oct 2013 at 05:00PM.  Lab:  30-Jul-15 17:10   pH (ABG)  7.27 (7.35-7.45 NOTE: New Reference Range 09/30/13)  PCO2  60 (32-48 NOTE: New Reference Range 09/30/13)  PO2  59 (83-108 NOTE: New Reference Range 09/30/13)  FiO2 24  Base Excess -0.6 (-2.0-3.0 NOTE: New Reference Range 09/30/13)  HCO3 27.6  (21-28 NOTE: New Reference Range 09/30/13)  Specimen Site (ABG) LT RADIAL  Specimen Type (ABG) ARTERIAL (Result(s) reported on 05 Oct 2013 at 05:21PM.)  Lactic Acid, Cardiopulmonary  1.40 (0.30-0.80 NOTE: New Reference Range 09/30/13)  01-Aug-15 15:50   pH (ABG)  7.09 (7.35-7.45 NOTE: New Reference Range 09/30/13)  PCO2  69 (32-48 NOTE: New Reference Range 09/30/13)  PO2  66 (83-108 NOTE: New Reference Range 09/30/13)  FiO2 36  Base Excess  -10.0 (-2.0-3.0 NOTE: New Reference Range 09/30/13)  HCO3  20.9 (21-28 NOTE: New Reference Range 09/30/13)  O2 Saturation 91.7  Specimen Site (ABG) LT RADIAL  O2 Device CANNULA  Specimen Type (ABG) ARTERIAL  Patient Temp (ABG) 37.0  Cardiology:  01-Aug-15 09:35   Echo Doppler REASON FOR EXAM:     COMMENTS:     PROCEDURE: [facility] - ECHO DOPPLER COMPLETE(TRANSTHOR)  - Oct 07 2013  9:35AM   RESULT: Echocardiogram Report  Patient Name:   Eileen [name] Date of Exam: 10/07/2013 Medical Rec #:  [id number]            Custom1: Date of Birth:  Dec 04, 1945         Height:       59.0 in Patient Age:    67 years          Weight:       396.0 lb  Patient Gender: F                 BSA:          2.47 m??  Indications: CVA Sonographer:    LTM Referring Phys: MODY, SITAL, P  Sonographer Comments: Patient is morbidly obese. Poor acoustic  parasternal and subcostal windows. An apical window could not be obtained.  Summary:  1. Left ventricular ejection fraction, by visual estimation, is 50 to  55%.  2. Normal global left ventricular systolic function.  3. Mildly elevated pulmonary artery systolic pressure. 2D AND M-MODE MEASUREMENTS (normal ranges within parentheses): Left Ventricle:          Normal   AoV Cusp Separation: 2.00 cm (1.5-2.6) IVSd (2D):      1.15 cm (0.7-1.1)                                   Aorta/LA:                  Normal                                   Aortic Root (2D): 3.40 cm (2.4-3.7)                                   AoV Cusp Exc:      2.00 cm (1.5-2.6)     Left Atrium (2D): 4.00 cm (1.9-4.0)                                    Right Ventricle:                                   RVd (2D): SPECTRAL DOPPLER ANALYSIS (where applicable): LVOT Vmax:  LVOT VTI:  LVOT Diameter: 2.10 cm Tricuspid Valve and PA/RV Systolic Pressure: TR Max Velocity: 3.08 m/s RA  Pressure: 10 mmHg RVSP/PASP: 47.9 mmHg Pulmonic Valve: PV Max Velocity: 1.15 m/s PV Max PG: 5.3 mmHg PV Mean PG:  PHYSICIAN INTERPRETATION: Left Ventricle: Global LV systolic function was normal. Left ventricular  ejection fraction, by visual estimation, is 50 to 55%. Pericardium: There is no evidence of pericardial effusion. Tricuspid Valve: The tricuspid regurgitant velocity is 3.08 m/s, and with  an assumed right atrial pressure of 10 mmHg, the estimated right  ventricular systolic pressure is mildly elevated at 47.9 mmHg.  [id number] Serafina Royals MD Electronically signed by [id number] Serafina Royals MD Signature Date/Time: 10/08/2013/8:34:20 AM  *** Final ***  IMPRESSION: .    Verified By: Corey Skains  ([facility]), M.D., MD  Routine Chem:  30-Jul-15 17:11   Glucose, Serum  137  BUN  63  Creatinine (comp)  5.65  Sodium, Serum 138  Potassium, Serum 5.0  Chloride, Serum 103  CO2, Serum 26  Calcium (Total), Serum  7.6  Anion Gap 9  Osmolality (calc) 296  eGFR (African American)  8  eGFR (Non-African American)  7 (eGFR values <21mL/min/1.73 m2 may be an indication of chronic kidney disease (CKD). Calculated eGFR is useful in patients with stable renal function. The eGFR calculation will not be reliable in acutely  ill patients when serum creatinine is changing rapidly. It is not useful in  patients on dialysis. The eGFR calculation may not be applicable to patients at the low and high extremes of body sizes, pregnant women, and vegetarians.)  31-Jul-15 05:40   Creatinine (comp)  5.36  01-Aug-15 05:09   Creatinine (comp)  5.46    15:50   Result  Comment - NOTIFIED OF CRITICAL VALUE  - READ-BACK PROCESS PERFORMED.  - Dr.Chen at 1556 on 10/07/13  - by gkm  Result(s) reported on 07 Oct 2013 at 04:00PM.  02-Aug-15 03:40   Glucose, Serum  176  BUN  70  Creatinine (comp)  5.35  Sodium, Serum  130  Potassium, Serum 5.0  Chloride, Serum 99  CO2, Serum  19  Calcium (Total), Serum  6.9  Anion Gap 12  Osmolality (calc) 286  eGFR (African American)  9  eGFR (Non-African American)  8 (eGFR values <2mL/min/1.73 m2 may be an indication of chronic kidney disease (CKD). Calculated eGFR is useful in patients with stable renal function. The eGFR calculation will not be reliable in acutely ill patients when serum creatinine is changing rapidly. It is not useful in  patients on dialysis. The eGFR calculation may not be applicable to patients at the low and high extremes of body sizes, pregnant women, and vegetarians.)  Result Comment LABS - This specimen was collected through an   - indwelling catheter or arterial line.  - A minimum of 40mls of blood was wasted prior    - to collecting the sample.  Interpret  - results with caution. CBC - SMEAR SCANNED  Result(s) reported on02 Aug 2015 at 04:26AM.  Magnesium, Serum  1.6 (1.8-2.4 THERAPEUTIC RANGE: 4-7 mg/dL TOXIC: > 10 mg/dL  -----------------------)  03-Aug-15 03:57   Creatinine (comp)  4.01  04-Aug-15 05:42   Creatinine (comp)  2.24  05-Aug-15 02:52   Glucose, Serum 95  BUN  44  Creatinine (comp)  1.73  Sodium, Serum 140  Potassium, Serum 4.3  Chloride, Serum 105  CO2, Serum 28  Calcium (Total), Serum  7.3  Anion Gap 7  Osmolality (calc) 290  eGFR (African American)  34  eGFR (Non-African American)  29 (eGFR values <34mL/min/1.73 m2 may be an indication of chronic kidney disease (CKD). Calculated eGFR is useful in patients with stable renal function. The eGFR calculation will not be reliable in acutely ill patients when serum creatinine is changing rapidly. It is not  useful in  patients on dialysis. The eGFR calculation may not be applicable to patients at the low and high extremes of body sizes, pregnant women, and vegetarians.)  06-Aug-15 04:16   Creatinine (comp) 1.26  09-Aug-15 03:48   Creatinine (comp) 1.11  10-Aug-15 03:59   Creatinine (comp) 1.11  12-Aug-15 04:25   Glucose, Serum 96  BUN  34  Creatinine (comp) 1.13  Sodium, Serum 141  Potassium, Serum  5.4  Chloride, Serum 101  CO2, Serum  37  Calcium (Total), Serum  7.8  Anion Gap  3  Osmolality (calc) 289  eGFR (African American)  57  eGFR (Non-African American)  49 (eGFR values <32mL/min/1.73 m2 may be an indication of chronic kidney disease (CKD). Calculated eGFR is useful in patients with stable renal function. The eGFR calculation will not be reliable in acutely ill patients when serum creatinine is changing rapidly. It is not useful in  patients on dialysis. The eGFR calculation may not be applicable to patients at the low and high extremes  of body sizes, pregnant women, and vegetarians.)  Cardiac:  30-Jul-15 17:11   Troponin I < 0.02 (0.00-0.05 0.05 ng/mL or less: NEGATIVE  Repeat testing in 3-6 hrs  if clinically indicated. >0.05 ng/mL: POTENTIAL  MYOCARDIAL INJURY. Repeat  testing in 3-6 hrs if  clinically indicated. NOTE: An increase or decrease  of 30% or more on serial  testing suggests a  clinically important change)  Routine UA:  30-Jul-15 17:10   Color (UA) Yellow  Clarity (UA) Turbid  Glucose (UA) Negative  Bilirubin (UA) Negative  Ketones (UA) Negative  Specific Gravity (UA) 1.023  Blood (UA) 1+  pH (UA) 5.0  Protein (UA) 30 mg/dL  Nitrite (UA) Negative  Leukocyte Esterase (UA) Trace (Result(s) reported on 05 Oct 2013 at 06:17PM.)  RBC (UA) 13 /HPF  WBC (UA) 24 /HPF  Bacteria (UA) 3+  Epithelial Cells (UA) 59 /HPF  Amorphous Crystal (UA) PRESENT (Result(s) reported on 05 Oct 2013 at 06:17PM.)  Routine Coag:  30-Jul-15 17:11   Prothrombin  13.4  INR 1.0 (INR reference interval applies to patients on anticoagulant therapy. A single INR therapeutic range for coumarins is not optimal for all indications; however, the suggested range for most indications is 2.0 - 3.0. Exceptions to the INR Reference Range may include: Prosthetic heart valves, acute myocardial infarction, prevention of myocardial infarction, and combinations of aspirin and anticoagulant. The need for a higher or lower target INR must be assessed individually. Reference: The Pharmacology and Management of the Vitamin K  antagonists: the seventh ACCP Conference on Antithrombotic and Thrombolytic Therapy. FEOFH.2197 Sept:126 (3suppl): [id number]. A HCT value >55% may artifactually increase the PT.  In one study,  the increase was an average of 25%. Reference:  "Effect on Routine and Special Coagulation Testing Values of Citrate Anticoagulant Adjustment in Patients with High HCT Values." American Journal of Clinical Pathology 2006;126:400-405.)  Routine Hem:  30-Jul-15 17:11   Hemoglobin (CBC)  10.4  WBC (CBC)  18.0  RBC (CBC)  3.73  Hematocrit (CBC)  32.9  Platelet Count (CBC)  109  MCV 88  MCH 28.0  MCHC  31.8  RDW  17.2  Neutrophil % 91.2  Lymphocyte % 3.9  Monocyte % 3.8  Eosinophil % 0.9  Basophil % 0.2  Neutrophil #  16.4  Lymphocyte #  0.7  Monocyte # 0.7  Eosinophil # 0.2  Basophil # 0.0 (Result(s) reported on 05 Oct 2013 at 05:27PM.)  02-Aug-15 03:40   Hemoglobin (CBC)  10.4  WBC (CBC) 9.9  RBC (CBC)  3.66  Hematocrit (CBC)  31.8  Platelet Count (CBC)  110  MCV 87  MCH 28.5  MCHC 32.9  RDW  16.9  Neutrophil % 86.5  Lymphocyte % 7.8  Monocyte % 4.4  Eosinophil % 0.8  Basophil % 0.5  Neutrophil #  8.6  Lymphocyte #  0.8  Monocyte # 0.4  Eosinophil # 0.1  Basophil # 0.0  05-Aug-15 02:52   Hemoglobin (CBC)  10.0  WBC (CBC) 8.6  RBC (CBC)  3.56  Hematocrit (CBC)  30.8  Platelet Count (CBC)  140  MCV 86  MCH 28.0  MCHC 32.5   RDW  16.4  Neutrophil % 80.6  Lymphocyte % 9.8  Monocyte % 7.9  Eosinophil % 1.4  Basophil % 0.3  Neutrophil #  7.2  Lymphocyte #  0.9  Monocyte # 0.7  Eosinophil # 0.1  Basophil # 0.0 (Result(s) reported on 11 Oct 2013 at 04:19AM.)  12-Aug-15 04:25   Hemoglobin (  CBC)  9.9 (Result(s) reported on 18 Oct 2013 at 05:14AM.)   PERTINENT RADIOLOGY STUDIES: XRay:    01-Aug-15 18:36, Chest Portable Single View  Chest Portable Single View   REASON FOR EXAM:    intubation  COMMENTS:       PROCEDURE: DXR - DXR PORTABLE CHEST SINGLE VIEW  - Oct 07 2013  6:36PM     CLINICAL DATA:  Intubation.    EXAM:  PORTABLE CHEST - 1 VIEW 1828 hr:    COMPARISON:  Portable chest x-rays earlier same date 1700 hr.    FINDINGS:  Endotracheal tube tip somewhat low, projecting approximately 1-2 cm  above the carina. It could be withdrawn approximately 3-4 cm.  Left jugular central venous catheter tip projects over the  innominate vein. Nasogastric tube courses below the diaphragm into  the stomach. External pacing pads.    Interval development of interstitial and airspace pulmonary edema  since the earlier examination. Suboptimal inspiration due to body  habitus accounts for atelectasis in the bases.     IMPRESSION:  1. Endotracheal tube tip somewhat low, projecting approximately 1-2  cm above the carina. This could be withdrawn approximately 3-4 cm.  2. Remaining support apparatus satisfactory.  3. Interval development of interstitial and airspace pulmonary edema  since the examination earlier today.  I called these results by telephone at the time of interpretation on  10/07/2013 at 7:09 pm to [facility], the nurse caring for the patient in the  CCU, who verbally acknowledged these results.      Electronically Signed    By: Evangeline Dakin M.D.    On: 10/07/2013 19:11         Verified By: Deniece Portela, M.D.,    12-Aug-15 06:27, Chest Portable Single View  Chest Portable Single View    REASON FOR EXAM:    pneumonia/ chf- compare.  COMMENTS:       PROCEDURE: DXR - DXR PORTABLE CHEST SINGLE VIEW  - Oct 18 2013  6:27AM     CLINICAL DATA:  Pneumonia and CHF    EXAM:  PORTABLE CHEST - 1 VIEW    COMPARISON:  10/12/2013    FINDINGS:  There is elevation of the right hemidiaphragm. There is prominence  of the central pulmonary vasculature. There is no focal parenchymal  opacity, pleural effusion, or pneumothorax. The heart and  mediastinum are stable.    The osseous structures are unremarkable.     IMPRESSION:  No active disease.      Electronically Signed    By: Kathreen Devoid    On: 10/18/2013 08:13         Verified By: Jennette Banker, M.D., MD  LabUnknown:    30-Jul-15 17:57, Chest Portable Single View  PACS Image     30-Jul-15 19:09, [place] Color Flow Doppler Lower Extrem Right (Leg)  PACS Image     30-Jul-15 23:21, Chest Portable Single View  PACS Image    Pertinent Past History:  Pertinent Past History 1.  Morbid obesity.  2.  Hypertension  3.  GERD. 4.  History of COPD.  5.  History of CHF.   Hospital Course:  Hospital Course 69 year old morbidly obese female with a history of obstructive sleep apnea, not compliant with CPAP; chronic lower extremity edema, history of congestive heart failure, likely diastolic in nature, who presents after a mechanical fall, subsequently noted to have septic shock in the ER.   * Acute on chronic respiratory failure due to pulmonary  edema and acute on chronic diastolic CHF Extubated. was on HFNC for few days- weaned to nasal canula 5 ltr/min on 10/18/13. Maintain sats to 88-92%. Refuses CPAP for sleep apnea. Counselled  feels better- try to taper oxygen today.   will d/c to rehab today due to generalized weakness..  * Aucte on  Chronic diastolic Congestive heart failure- Improving Decompensated likely from sepsis. On lasix $Remove'40mg'cEoaZUc$  IV daily. Switched to PO daily Good UOP, renal function stable. lost > 20 Lb in  hospital so far.  * New AFib: rate controlled.  echo: EF 50-55%. Dr. Nehemiah Massed following pt. on eliquis.  * Septic shock due to LE cellulitis and E coli uti  was On linezolid and Invanz. switched to Bactrim/ Levaquin Bcx Negative. WBC normal now. Afebrile. stopped oral ABx. continue local applications.  * Acute renal failure, due to acute tubular necrosis from septic shock. Resolved. better UOP. Stopped bicarb drip  * Hyponatremia: Resolved with hydration.  * Obstructive sleep apnea: Likley making thisgs worse for her. refused to use c pap.  * Hyperglycemia: improved. on Sliding Scale Insulin, A1C: 5.9.  * Anemia of Chronic Disease : stable.  * Mild thrombocytopenia due to sepsis   Condition on Discharge Stable   Code Status:  Code Status No Code/Do Not Resuscitate   DISCHARGE INSTRUCTIONS HOME MEDS:  Medication Reconciliation: Patient's Home Medications at Discharge:     Medication Instructions  aspirin 81 mg oral tablet  1 tab(s) orally once a day   lasix 40 mg oral tablet  1 tab(s) orally once a day   ramipril 10 mg oral capsule  0.5 cap(s) orally once a day   apixaban 5 mg oral tablet  1 tab(s) orally 2 times a day   metoprolol tartrate 25 mg oral tablet  1 tab(s) orally 2 times a day   albuterol-ipratropium 2.5 mg-0.5 mg/3 ml inhalation solution  3 milliliter(s) inhaled 3 times a day   bacitracin topical  Apply topically to affected area ( legs) 2 times a day x 7 days    nystatin 100,000 units/g topical ointment  Apply topically to affected area every 12 hours ( skin creases)    famotidine 20 mg oral tablet  1 tab(s) orally every 24 hours   senna  1 tab(s) orally 2 times a day, As Needed, constipation , As needed, constipation   acetaminophen-oxycodone 325 mg-5 mg oral tablet  1 tab(s) orally every 6 hours, As Needed, moderate pain (4-6/10) , As needed, moderate pain (4-6/10)    STOP TAKING THE FOLLOWING MEDICATION(S):    coreg 6.25 mg oral tablet: 1 tab(s)  orally once a day  Physician's Instructions:  Treatments Foley to leg bag  routine foley care, remove foley in next 2-3 days- once pt is able to move around more.   Home Oxygen? Yes   Oxygen delivery at home: 4L  Nasal Cannula   Diet Low Sodium  Low Fat, Low Cholesterol  Carbohydrate Controlled (ADA) Diet   Activity Limitations As tolerated   Return to Work Not Applicable   Time frame for Follow Up Appointment 1-2 weeks  pmd     Golden Pop A(Family Physician): [facility], [address] [place], [place] [address], [place] [phone]  Electronic Signatures: Vaughan Basta (MD)  (Signed 13-Aug-15 13:37)  Authored: ADMISSION DATE AND DIAGNOSIS, CHIEF COMPLAINT/HPI, Allergies, PERTINENT LABS, PERTINENT RADIOLOGY STUDIES, PERTINENT PAST HISTORY, HOSPITAL COURSE, DISCHARGE INSTRUCTIONS HOME MEDS, PATIENT INSTRUCTIONS, Follow Up Physician   Last Updated: 13-Aug-15 13:37 by Anselm Jungling,  Rosalio Macadamia (MD)

## 2014-06-30 NOTE — Consult Note (Signed)
   Comments   Follow up visit made. Pt now on 5L 02 [place] and tolerating. I met first with pt's husband and then with pt and husband. Explained that pt will be ready for d/c tomorrow if she continues to do well. Discussed the importance of pt getting to SNF to start rehab. Both seem hesitant for pt to leave hospital but accept that she will likely d/c tomorrow.     Electronic Signatures: Aleana Fifita, Izora Gala (MD)  (Signed 12-Aug-15 17:09)  Authored: Palliative Care   Last Updated: 12-Aug-15 17:09 by Edith Groleau, Izora Gala (MD)

## 2014-06-30 NOTE — Consult Note (Signed)
PATIENT NAME:  Eileen MorrowHOPKINS, Eileen Eileen [name] MR#:  [id number] DATE OF BIRTH:  10/24/1945  DATE OF CONSULTATION:  10/10/2013  REFERRING PHYSICIAN:   CONSULTING PHYSICIAN:  Lamar BlinksBruce J. Kowalski, MD   The patient is still intubated, with appropriate oxygenation, with atrial fibrillation  controlled.   Physical examination is unchanged from before .   ASSESSMENT: The patient is Eileen [name] 69 year old female with diastolic dysfunction and heart failure with atrial fibrillation, sepsis, and hypotension now resolving at this time without evidence of myocardial infarction. Would continue at this time to have heparin anticoagulation, and consider extubation.    ____________________________ Lamar BlinksBruce J. Kowalski, MD bjk:ms D: 10/11/2013 08:23:14 ET T: 10/11/2013 09:21:11 ET JOB#: [id number]  cc: Lamar BlinksBruce J. Kowalski, MD, <Dictator> Lamar BlinksBRUCE J KOWALSKI MD ELECTRONICALLY SIGNED 10/18/2013 10:16

## 2014-06-30 NOTE — Consult Note (Signed)
Brief Consult Note: Diagnosis: lymphedema with cellulitis, rt LE.   Patient was seen by consultant.   Recommend further assessment or treatment.   Comments: No acute surgical needs or possibilities. Rx mild cellulits with IV abx and elevation. Her massive morbid obesity (BMI 77) has caused severe lymphedema. There is no surgical management for this and any surgical procedure would result in a nonhealing wound with terrible risk for life threatoning infection/ will sign off.  Electronic Signatures: Lattie Hawooper, Birgit Nowling E (MD)  (Signed 06-Aug-15 15:35)  Authored: Brief Consult Note   Last Updated: 06-Aug-15 15:35 by Lattie Hawooper, Nahomy Limburg E (MD)

## 2014-06-30 NOTE — Consult Note (Signed)
PATIENT NAME:  Eileen Eileen [name], Eileen Eileen [name] MR#:  [id number] DATE OF BIRTH:  [birth year]  DATE OF CONSULTATION:  10/08/2013  REFERRING PHYSICIAN:  Cletis Athensavid K. Hower, MD. CONSULTING PHYSICIAN:  Lamar BlinksBruce J. Cordero Surette, MD.  REASON FOR CONSULTATION: Atrial fibrillation with rapid ventricular rate.   CHIEF COMPLAINT: The patient is intubated.   HISTORY OF PRESENT ILLNESS: This is Eileen [name] 69 year old female with significant sepsis and other infection with hypoxia, now having acute atrial fibrillation with rapid ventricular rate, multifactorial in nature, including sepsis and infection. The patient does have also COPD on sleep apnea treatment, now intubated, with chronic lower extremity edema, history of congestive heart failure, diastolic in nature, who presented to the hospital several days prior with Eileen [name] fall and septic shock. The patient has had better blood pressure control but does have also acute renal failure with tubular necrosis likely contributing to the atrial fibrillation. She had not received any treatment, although further adjustments of other treatment for hypoxia have improved her atrial fibrillation. She was placed on Eileen [name] heparin drip for further risk reduction and stroke risk, which is appropriate at this time without any bleeding complications.  Also her vasopressin drip has been decreased. The patient now has atrial fibrillation with controlled ventricular rate. The remainder of her review of systems cannot be assessed due to intubation and obtundation.   PAST MEDICAL HISTORY:  Gastroesophageal reflux, diastolic dysfunction, congestive heart failure, chronic obstructive pulmonary disease and hypertension.   FAMILY HISTORY: Positive for coronary artery disease in her father and mother.   SOCIAL HISTORY: The patient has previously not had any alcohol or tobacco use.   ALLERGIES: AS LISTED.   MEDICATIONS: As listed.   PHYSICAL EXAMINATION:  VITAL SIGNS: Blood pressure is 80/40, heart rate is 70 and irregular.   GENERAL: She is large female, intubated and not very responsive.  HEENT: No icterus, thyromegaly, ulcers, hemorrhage, or xanthelasma.  CARDIOVASCULAR: Irregularly irregular with distant heart sounds. No apparent murmur, gallop, or rub. PMI is diffuse. Diffuse carotid upstrokes, normal without bruit. Jugular venous pressure is not seen.  LUNGS: Have diffuse expiratory wheezes.  ABDOMEN: Soft. I cannot assess hepatosplenomegaly or masses due to increased abdominal girth.  EXTREMITIES: 2+ radial, no apparent dorsal pedal pulses. There are significant infectious lesions on her right leg and 2+ edema.  NEUROLOGICAL: The patient is obtunded and intubated.   ASSESSMENT: This is Eileen [name] 69 year old female with hypotension due to sepsis and cellulitis with lower extremity edema and diastolic-dysfunction congestive heart failure, now with atrial fibrillation with rapid ventricular rate, more controlled now, likely secondary to vasopressin use,  hypoxia and sepsis now more stable.   RECOMMENDATIONS:  1.  No additional medications for heart rate control, which is stable and concerns with the possibility of hypotension.  2.  Continue treatment of sepsis.  3.  Decrease vasopressin use if able as blood pressure improves.  4.  Continue heparin for further risk reduction of deep venous thrombosis as well as stroke with atrial fibrillation.  5.  No further cardiac diagnosis testing at this time.    ____________________________ Lamar BlinksBruce J. Carlyon Nolasco, MD bjk:lt D: 10/08/2013 08:08:38 ET T: 10/08/2013 09:14:48 ET JOB#: [id number]  cc: Lamar BlinksBruce J. Praveen Coia, MD, <Dictator> Lamar BlinksBRUCE J Saydie Gerdts MD ELECTRONICALLY SIGNED 10/18/2013 10:16

## 2014-07-01 NOTE — Discharge Summary (Signed)
PATIENT NAME:  Eileen [name], Eileen [name] MR#:  [id number] DATE OF BIRTH:  1945-11-03  DATE OF ADMISSION:  08/03/2011 DATE OF DISCHARGE:  08/11/2011  PRIMARY CARE PHYSICIAN: Vonita MossMark Crissman, MD  FINAL DIAGNOSES:  1. Systemic inflammatory response syndrome with right lower extremity cellulitis and possible urinary tract infection.  2. Acute renal failure, improved.  3. Hypotension.  4. Obesity and sleep apnea, on chronic oxygen.  5. Constipation.  6. Chronic joint pain.  7. Fungal infection of the groin.  8. History of congestive heart failure. No signs of congestive heart failure on this hospital stay.   DISCHARGE MEDICATIONS: 1. Doxycycline 100 mg p.o. twice [name] day for seven days then stop.  2. Heparin subcutaneous injection 5000 units twice [name] day until more ambulatory.  3. Aspirin 81 mg p.o. daily.  4. Protonix 40 mg p.o. daily.  5. Norco 5/325 mg 1/2 tablet every six hours as needed for pain. 6. Tylenol 650 mg every six hours as needed for pain. 7. Colace 100 mg p.o. twice [name] day. 8. Lactulose 30 mL twice [name] day as needed for constipation.  9. Nystatin powder apply under skin folds twice [name] day. Do not apply to open wounds on the right lower extremity.  10. Prednisone 2.5 mg p.o. daily, this is for joint pain. Once the kidney function continues to be stable can switch back to her Naprosyn that she was taking at home. 11. Ramipril 2.5 mg p.o. daily.  12. Coreg 3.125 mg p.o. twice [name] day. 13. Lasix 20 mg p.o. daily.  14. Oxygen 4 liters.   DIET: Low sodium diet.   ACTIVITY: As tolerated with physical therapy.  DISCHARGE FOLLOWUP: Followup [facility] in one week and nephrology, Dr. Lamont DowdySarath Kolluru, in 1 to 2 days, and doctor at rehab. Recommend checking [name] CBC and BMP on Monday.   REASON FOR ADMISSION: The patient was admitted 08/03/2011 for right leg pain and erythema for one month worsening over the last five days. The patient was admitted with systemic inflammatory response  syndrome, right leg cellulitis, leukocytosis, and acute renal failure. She was given IV fluid hydration. Medications that affected kidney function were held. She was started on IV clindamycin.   LABS/RADIOLOGICAL DATA: Sedimentation rate 47, glucose 138, BUN 68, creatinine 4.4, sodium 139, potassium 5.0, chloride 104, CO2 25, and calcium 8.5. Liver function tests normal range. White blood cell count 17.5, hemoglobin and hematocrit 11.3 and 35.6, and platelet count 110.   Ultrasound of the left lower extremity: No evidence of deep vein thrombosis.   Blood cultures negative.   Ultrasound of the kidney: No hydronephrosis.   Hepatitis [name], B, and C panel: Hepatitis B surface antibody 0.26. Other hepatitis profiles negative. ANCA negative. ASO titer slightly high at 203. C4 slightly high at 39. C3 normal range, 133. ANA negative. Protein electrophoresis: Alpha I globulin high at 0.5. Albumin low at 2.4. Total protein low at 5.3. No M spike.   Urine culture was negative even though urinalysis was positive.  Hemoglobin A1c is 5.9. LDL 37, HDL 12, and triglycerides [id number].   Echocardiogram showed left ventricular function normal size, ejection fraction greater than 55%.   Urine protein electrophoresis, total protein urine high at 47.8. Eosinophils in urine negative. Creatinine trended down during the hospital course. Creatinine upon discharge 1.02. GFR 57. Platelet count upon discharge 143 and white count upon discharge 8.5.   HOSPITAL COURSE PER PROBLEM LIST:  1. For the systemic inflammatory response syndrome with right lower  extremity cellulitis and possible urinary tract infection, the patient was initially started on IV clindamycin and then was switched over to IV Levaquin and IV Zyvox. Wounds continued to drain, erythema did improve. Consult by Dr. Leavy Cella was done on 08/10/2011 after the patient had been here [name] week. He switched the patient over to oral doxycycline for another week. Probably  underlying venous stasis also. He recommended continuing doxycycline for another week and following up at the [facility]. We will have the wound care nurse to evaluate for dressing prior to discharge.  2. For the patient's acute renal failure, this had improved with IV fluid hydration and holding all medications that can affect the kidney function including Naprosyn, Lasix, and Ramipril. Ramipril was restarted and low dose Lasix restarted upon discharge since creatinine normalized, Naprosyn still held.  3. For the patient's hypotension, this had improved. Blood pressure upon discharge was 135/66. Medications for blood pressure was restarted upon discharge.  4. Obesity and sleep apnea, on chronic oxygen 4 liters. Respiratory status has been stable during the hospital stay. The patient's BMI is 61.8. Weight loss is needed.  5. Constipation. Lactulose and Colace were given.  6. For her chronic joint pain, I did put the patient on [name] low-dose prednisone since I cannot give Naprosyn at this time with the acute renal failure. Once the kidney function stays stabilized can consider switching back to the Naprosyn.  7. Fungal infection of the groin. We will continue nystatin powder.  8. History of congestive heart failure. The patient states that she does not have congestive heart failure just edema. There were no signs of congestive heart failure on this hospital stay. The patient actually received IV fluids with the hypotension and acute renal failure. I will restart the patient's cardiac medications upon discharge, low dose ramipril 2.5 mg p.o. daily, Coreg 3.125 mg p.o. twice [name] day, and low dose Lasix.  TIME SPENT ON DISCHARGE: 40 minutes.  ____________________________ Herschell Dimes. Renae Gloss, MD rjw:slb D: 08/11/2011 09:04:22 ET     T: 08/11/2011 09:35:24 ET        JOB#: [id number] cc: Herschell Dimes. Renae Gloss, MD, <Dictator> Steele Sizer, MD Salley Scarlet MD ELECTRONICALLY SIGNED 08/13/2011 17:26

## 2014-07-01 NOTE — Consult Note (Signed)
PATIENT NAME:  Eileen [name], Eileen [name] MR#:  [id number] DATE OF BIRTH:  1946-01-15  DATE OF CONSULTATION:  08/10/2011  REFERRING PHYSICIAN:  Alford Highland, MD   CONSULTING PHYSICIAN:  Rosalyn Gess. Toua Stites, MD  REASON FOR CONSULTATION: Right lower extremity cellulitis.   HISTORY OF PRESENT ILLNESS: The patient is a 69 year old white female with a past history significant for congestive heart failure who was admitted on 08/03/2011 with a several-day history of increasing right leg pain and swelling and blistering with a one-day history of rigors. The patient states that she had been having some swelling and redness in the right lower extremity. She developed clear blisters that became larger and caused her more pain. The History and Physical indicates that she had been having symptoms for a month, but she states that she has only been having the symptoms for the last several days. She presented to the Emergency Room and was subsequently admitted. She was initially started on clindamycin, and she was also given a dose of vancomycin initially. Her antibiotics were then changed to Zyvox. Levofloxacin was added for unclear reasons at the beginning of the weekend. Her white count was elevated on admission at 17.5 and had come down to normal by May 31st. Blood cultures were obtained on admission that have been negative. She continues to have redness and ulceration over the right lower extremity. The left lower extremity is unaffected. She denies any further fevers, chills, or sweats while in house.   ALLERGIES: Betadine, Darvon, Ultram and penicillin, the latter of which causes severe oral ulcers.   PAST MEDICAL HISTORY:  1. Congestive heart failure.  2. Obesity.  3. Osteoarthritis.  4. Childhood injury to her left leg with a subsequent severe infection requiring surgery at [age] weeks old. 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   5. Herniated disk at age 63.   SOCIAL HISTORY: The patient lives at home. She does not smoke. She does not drink. No injecting drug use.  FAMILY HISTORY: Positive for hypertension and diabetes.    REVIEW OF SYSTEMS: GENERAL: Positive for fevers, rigors, and malaise on admission. HEENT: No headaches or sinus congestion. No sore throat. NECK: No stiffness. No swollen glands. RESPIRATORY: No cough, no shortness of breath. No sputum production. CARDIAC: No chest pains or palpitations. GI: No nausea. No vomiting, no abdominal pain. No change in her bowels. This morning, however, she did have some nausea for the first time. GENITOURINARY: No change in her urine. She was noted to have acute renal failure on admission and this has improved. MUSCULOSKELETAL: She has pain and swelling in the right lower extremity but no focal joint complaints. SKIN: She has the blisters and the ulcerations in the right lower extremity but no other rashes. NEUROLOGIC: No focal weakness. PSYCHIATRIC: No complaints. All other systems are negative.   PHYSICAL EXAMINATION:  VITAL SIGNS: T-max 99.3, T-current 98.4, pulse 82, blood pressure 119/66, 95% on 4 liters.   GENERAL: A 69 year old obese white female in no acute distress.   HEENT: Normocephalic, atraumatic. Pupils are equal and reactive to light. Extraocular motion is intact. Sclerae, conjunctivae, and lids are without evidence for emboli or petechiae. Oropharynx shows no erythema or exudate. Gums are in fair condition.   NECK: Supple. Full range of motion. Midline trachea.  No lymphadenopathy. No thyromegaly.   CHEST: Clear to auscultation bilaterally with good air movement. No focal consolidation.   HEART: Regular rate and rhythm without murmur, rub, or gallop.   ABDOMEN: Soft, nontender, and nondistended. No hepatosplenomegaly. No hernia is noted.    EXTREMITIES: Both legs are somewhat shortened compared to the expected torso size. The left lower extremity had a compression stocking on and did not appear to have significant edema. The right lower extremity was edematous. There were shallow ulcerations on the medial and lateral aspects with some whitish slough-like material. There was some surrounding erythema but not significant. There was not significant warmth, and there was mild tenderness. There was edema and on the right side compared to the left.   NEUROLOGIC: The patient was awake and interactive, moving all four extremities.   PSYCHIATRIC: Mood and affect appeared normal.   LABORATORY, DIAGNOSTIC AND RADIOLOGICAL DATA:  BUN 20, creatinine 1.02. Creatinine on admission was 4.40.  LFTs on admission were unremarkable except for an AST of 46.  White count from May 31st  was 10.6, with a hemoglobin of 10.2, and a platelet count of 164, ANC of 8.5. White count on admission was 17.5.  Sedimentation rate on admission was 47.  Blood cultures from admission show no growth.  A urinalysis from admission had 3+ blood, negative nitrites, 2+ leukocyte esterase, 309 red cells, 72 white cells.  Urine culture had no growth.  A repeat urinalysis from May 31st had negative nitrites, 2+ leukocyte esterase, 384 red cells, 45 white cells, and mixed bacterial flora on culture.  A venous Doppler of the right showed no evidence for deep vein thrombosis.  A renal ultrasound showed no hydronephrosis or other acute changes.   IMPRESSION: A 69 year old white female with a past history significant for congestive heart failure who was admitted with rigors and possible cellulitis.   RECOMMENDATIONS:  1. It is difficult to assess whether she had cellulitis or venostasis with inflammation as she is eight days into her hospitalization. The History and Physical indicates that she had symptoms for a month prior to admission, but she states it was only a few days. She  does describe rigors, and she had leukocytosis which would be more consistent with cellulitis.  2. Her white count has come down over the past week.  3. I suspect that she has some venostasis, and it may take quite a while for her leg to heal. I would recommend a Wound Center evaluation.  4. She has a penicillin allergy which causes severe mouth sores. She does not know if she has ever taken Keflex. We will change her to oral doxycycline. I would treat for another week.  5. She would benefit from compression but states that she cannot tolerate compression stockings on the right. We will see if Wound Care can wrap her successfully.   This is a moderately complex Infectious Disease case. Thank you very much for involving me in Ms. Umstead' care.  ____________________________ Rosalyn GessMichael E. Yann Biehn, MD meb:cbb D: 08/10/2011 16:51:18 ET T: 08/11/2011 09:34:40 ET JOB#: [id number] Rosalva Neary E Makenize Messman MD ELECTRONICALLY SIGNED 08/18/2011 8:43

## 2014-07-01 NOTE — H&P (Signed)
PATIENT NAME:  Eileen [name], Eileen [name] MR#:  [id number] DATE OF BIRTH:  [birth year]  DATE OF ADMISSION:  08/03/2011  PRIMARY CARE PHYSICIAN: Dr. Golden Pop  REFERRING PHYSICIAN: Dr. Renee Ramus  CHIEF COMPLAINT: Right leg pain and erythema for one month, worsening for five days.   HISTORY OF PRESENT ILLNESS: 69 year old Caucasian female with a history of congestive heart failure, arthritis, chronic right leg infection, and obesity who presented to the ED with the above chief complaint. The patient is alert, awake, oriented, in no acute distress. She said she has had a right leg infection for about one month but worsening for the past five days. She has fever, chills, leg pain, and erythema, but she denies any headache or dizziness. No chest pain, palpitations, orthopnea, or nocturnal dyspnea. She said she has had congestive heart failure for more than 10 years and has been on Lasix. The Lasix dose was increased from 40 mg to 60 mg two weeks ago. She said she never had a kidney problem before and never had diabetes before. She was found to have right leg cellulitis and was treated with Levaquin in the ED. In addition, her creatinine is 4.4 so she will be admitted for leg cellulitis and acute renal failure.   PAST MEDICAL HISTORY:  1. Congestive heart failure.  2. Arthritis.  3. Chronic leg infection.  4. Obesity.   SOCIAL HISTORY: Denies any smoking or drinking or illicit drugs.   SURGICAL HISTORY:  Cholecystectomy.   FAMILY HISTORY: Hypertension, diabetes.   ALLERGIES: Betadine, Darvon, penicillin, Ultram.   HOME MEDICATIONS:  1. Arthrotec 75 mg twice daily.  2. Lasix 40 mg daily, now is 60 mg p.o. daily.  3. Lotensin 25/25 daily.  4. Nexium 40 mg p.o. daily.   REVIEW OF SYSTEMS: CONSTITUTIONAL: The patient has fever, chills, weakness, but no weight loss or weight gain. EYES: No double vision or blurred vision. ENT: No epistaxis, postnasal drip, or hearing loss. No dysphagia or slurred speech.  RESPIRATORY: No cough, sputum, shortness of breath, or hemoptysis.  CARDIOVASCULAR: No chest pain, palpitations, orthopnea, or nocturnal dyspnea. GI: No abdominal pain, nausea, vomiting, or diarrhea. No melena or bloody stool. GU: No dysuria, hematuria, or incontinence. ENDOCRINE: No polyuria, polydipsia, heat or cold intolerance. HEMATOLOGY: No easy bruising or bleeding.  NEUROLOGY: No syncope, loss of consciousness, or seizure. EXTREMITIES: Positive for right leg pain and redness and the buttocks sore, also mild swelling of the right leg.     PHYSICAL EXAMINATION:  VITALS: Temperature 99.3. Blood pressure 120/62, pulse 71, oxygen saturation 99% on room air, respirations 22.   GENERAL: The patient is alert, awake, and oriented, in no acute distress. Morbid obesity.   HEENT: Pupils are round, equal, reactive to light and accommodation. Moist oral mucosa. Clear oropharynx.   NECK: Supple. No JVD or carotid bruit. No lymphadenopathy. No thyromegaly.   CARDIOVASCULAR: S1, S2, regular rate and rhythm. No murmurs or gallops.   PULMONARY: Bilateral air entry. No wheezing or rales.   ABDOMEN: Soft, obese. Bowel sounds present. It is very difficult to estimate whether the patient has organomegaly due to obesity.   EXTREMITIES: There is tenderness, erythema, and mild swelling in the lower part of the right lower extremities with some blisters.  The patient is morbidly obese and it is difficult for her to move her legs.    NEUROLOGIC: Alert and oriented times three. No focal deficits.   LABORATORY, DIAGNOSTIC, AND RADIOLOGICAL DATA: WBC 17.5, hemoglobin 11.3, platelets 110, glucose 138, BUN  68, creatinine 4.4, sodium 139, potassium 5, chloride 104, bicarbonate 25. GFR 10. ESR 47. Ultrasound venous duplex of lower extremities: Only limited evaluation of popliteal but with no definite changes suspicious for occlusion or thrombus identified.   IMPRESSION:  1. Right leg cellulitis.  2. Leukocytosis.   3. Acute renal failure.  4. Anemia and thrombocytopenia.  5. History of congestive heart failure.  6. Morbid obesity.   PLAN OF TREATMENT:   1. The patient will be admitted to the telemetry floor. We will start clindamycin and follow up CBC for cellulitis.  2. For acute renal failure, we will hold Lasix, Lotensin, and NSAIDs.  We will give IV fluid, normal saline at 100 mL per hour and follow up basic metabolic panel and kidney and bladder ultrasound.  3. We will get nephrology consult.  4. For congestive heart failure, we will get an echocardiogram to evaluate the patient's cardiac function. We may increase IV fluid rate depending on patient's cardiac function, but now congestive heart failure is stable.  5. We will get a lipid panel, hemoglobin A1c, and TSH.   Discussed the patient's situation and plan of treatment with the patient and the patient's husband.   TIME SPENT: About 65 minutes.   ____________________________ Demetrios Loll, MD qc:bjt D: 08/03/2011 16:13:47 ET T: 08/03/2011 16:47:24 ET JOB#: [id number]  cc: Demetrios Loll, MD, <Dictator> Guadalupe Maple, MD Demetrios Loll MD ELECTRONICALLY SIGNED 08/05/2011 15:43

## 2014-07-01 NOTE — Consult Note (Signed)
Impression: 69yo WF w/ h/o CHF admitted with rigors and possible cellulitis.  It is difficult to assess whether she had cellulitis or venous stasis with inflammation as she is 8 days into her hospitalization. The H&P indicates that she had symptoms for a month prior to admission, but she states it was only a few days.  She does describe rigors and she had leukocytosis, which would be more consistent with cellulitis. Her WBC has come down over the past week.   I suspect that she has some venous stasis and it may take quite a while for her leg to heal.  Would recommend wound center evaluation. She has a PCN allergy which causes severe mouth sores.  She does not know if she has ever taken keflex.  Will change her to oral doxycycline.  Would treat for another week. She would likely benefit from compression, but she states that she cannot tolerate compression stockings on the right.  Will see if wound care can wrap her successfully.  Electronic Signatures: Emelin Dascenzo, Rosalyn GessMichael E (MD)  (Signed on 03-Jun-13 16:42)  Authored  Last Updated: 03-Jun-13 16:42 by Melisha Eggleton, Rosalyn GessMichael E (MD)

## 2014-09-14 ENCOUNTER — Emergency Department: Payer: PPO

## 2014-09-14 ENCOUNTER — Encounter: Payer: Self-pay | Admitting: Emergency Medicine

## 2014-09-14 ENCOUNTER — Inpatient Hospital Stay
Admission: EM | Admit: 2014-09-14 | Discharge: 2014-10-03 | DRG: 177 | Disposition: A | Discharge status: Skilled Nursing Facility | Payer: PPO | Attending: Internal Medicine | Admitting: Internal Medicine

## 2014-09-14 DIAGNOSIS — D696 Thrombocytopenia, unspecified: Secondary | ICD-10-CM | POA: Diagnosis present

## 2014-09-14 DIAGNOSIS — J441 Chronic obstructive pulmonary disease with (acute) exacerbation: Secondary | ICD-10-CM | POA: Diagnosis present

## 2014-09-14 DIAGNOSIS — Z9981 Dependence on supplemental oxygen: Secondary | ICD-10-CM

## 2014-09-14 DIAGNOSIS — I10 Essential (primary) hypertension: Secondary | ICD-10-CM | POA: Diagnosis present

## 2014-09-14 DIAGNOSIS — I2510 Atherosclerotic heart disease of native coronary artery without angina pectoris: Secondary | ICD-10-CM | POA: Diagnosis present

## 2014-09-14 DIAGNOSIS — I4891 Unspecified atrial fibrillation: Secondary | ICD-10-CM | POA: Diagnosis not present

## 2014-09-14 DIAGNOSIS — K5900 Constipation, unspecified: Secondary | ICD-10-CM | POA: Diagnosis present

## 2014-09-14 DIAGNOSIS — R410 Disorientation, unspecified: Secondary | ICD-10-CM | POA: Diagnosis not present

## 2014-09-14 DIAGNOSIS — R45851 Suicidal ideations: Secondary | ICD-10-CM | POA: Diagnosis present

## 2014-09-14 DIAGNOSIS — D638 Anemia in other chronic diseases classified elsewhere: Secondary | ICD-10-CM | POA: Diagnosis present

## 2014-09-14 DIAGNOSIS — Z833 Family history of diabetes mellitus: Secondary | ICD-10-CM | POA: Diagnosis not present

## 2014-09-14 DIAGNOSIS — E662 Morbid (severe) obesity with alveolar hypoventilation: Secondary | ICD-10-CM | POA: Diagnosis present

## 2014-09-14 DIAGNOSIS — Z6841 Body Mass Index (BMI) 40.0 and over, adult: Secondary | ICD-10-CM | POA: Diagnosis not present

## 2014-09-14 DIAGNOSIS — E873 Alkalosis: Secondary | ICD-10-CM | POA: Diagnosis present

## 2014-09-14 DIAGNOSIS — L8990 Pressure ulcer of unspecified site, unspecified stage: Secondary | ICD-10-CM | POA: Insufficient documentation

## 2014-09-14 DIAGNOSIS — J9622 Acute and chronic respiratory failure with hypercapnia: Secondary | ICD-10-CM | POA: Diagnosis present

## 2014-09-14 DIAGNOSIS — I509 Heart failure, unspecified: Secondary | ICD-10-CM | POA: Diagnosis not present

## 2014-09-14 DIAGNOSIS — E875 Hyperkalemia: Secondary | ICD-10-CM | POA: Diagnosis present

## 2014-09-14 DIAGNOSIS — J15212 Pneumonia due to Methicillin resistant Staphylococcus aureus: Secondary | ICD-10-CM | POA: Diagnosis present

## 2014-09-14 DIAGNOSIS — E1165 Type 2 diabetes mellitus with hyperglycemia: Secondary | ICD-10-CM | POA: Diagnosis present

## 2014-09-14 DIAGNOSIS — Z794 Long term (current) use of insulin: Secondary | ICD-10-CM | POA: Diagnosis not present

## 2014-09-14 DIAGNOSIS — J449 Chronic obstructive pulmonary disease, unspecified: Secondary | ICD-10-CM | POA: Diagnosis not present

## 2014-09-14 DIAGNOSIS — Z8249 Family history of ischemic heart disease and other diseases of the circulatory system: Secondary | ICD-10-CM

## 2014-09-14 DIAGNOSIS — Z888 Allergy status to other drugs, medicaments and biological substances status: Secondary | ICD-10-CM

## 2014-09-14 DIAGNOSIS — J9621 Acute and chronic respiratory failure with hypoxia: Secondary | ICD-10-CM | POA: Diagnosis present

## 2014-09-14 DIAGNOSIS — F329 Major depressive disorder, single episode, unspecified: Secondary | ICD-10-CM | POA: Diagnosis present

## 2014-09-14 DIAGNOSIS — R0902 Hypoxemia: Secondary | ICD-10-CM

## 2014-09-14 DIAGNOSIS — M1990 Unspecified osteoarthritis, unspecified site: Secondary | ICD-10-CM | POA: Diagnosis present

## 2014-09-14 DIAGNOSIS — E8809 Other disorders of plasma-protein metabolism, not elsewhere classified: Secondary | ICD-10-CM | POA: Diagnosis present

## 2014-09-14 DIAGNOSIS — Z515 Encounter for palliative care: Secondary | ICD-10-CM | POA: Diagnosis not present

## 2014-09-14 DIAGNOSIS — I472 Ventricular tachycardia: Secondary | ICD-10-CM | POA: Diagnosis present

## 2014-09-14 DIAGNOSIS — I482 Chronic atrial fibrillation: Secondary | ICD-10-CM | POA: Diagnosis present

## 2014-09-14 DIAGNOSIS — J9690 Respiratory failure, unspecified, unspecified whether with hypoxia or hypercapnia: Secondary | ICD-10-CM

## 2014-09-14 DIAGNOSIS — R0689 Other abnormalities of breathing: Secondary | ICD-10-CM | POA: Diagnosis not present

## 2014-09-14 DIAGNOSIS — Z66 Do not resuscitate: Secondary | ICD-10-CM | POA: Diagnosis not present

## 2014-09-14 DIAGNOSIS — E874 Mixed disorder of acid-base balance: Secondary | ICD-10-CM | POA: Diagnosis not present

## 2014-09-14 DIAGNOSIS — Z880 Allergy status to penicillin: Secondary | ICD-10-CM

## 2014-09-14 DIAGNOSIS — J810 Acute pulmonary edema: Secondary | ICD-10-CM | POA: Diagnosis not present

## 2014-09-14 HISTORY — DX: Unspecified osteoarthritis, unspecified site: M19.90

## 2014-09-14 HISTORY — DX: Chronic obstructive pulmonary disease, unspecified: J44.9

## 2014-09-14 LAB — CBC WITH DIFFERENTIAL/PLATELET
BASOS PCT: 0 %
Basophils Absolute: 0 10*3/uL (ref 0–0.1)
EOS ABS: 0.1 10*3/uL (ref 0–0.7)
Eosinophils Relative: 2 %
HCT: 30.6 % — ABNORMAL LOW (ref 35.0–47.0)
Hemoglobin: 9.6 g/dL — ABNORMAL LOW (ref 12.0–16.0)
LYMPHS ABS: 0.9 10*3/uL — AB (ref 1.0–3.6)
LYMPHS PCT: 13 %
MCH: 29.4 pg (ref 26.0–34.0)
MCHC: 31.3 g/dL — ABNORMAL LOW (ref 32.0–36.0)
MCV: 93.8 fL (ref 80.0–100.0)
Monocytes Absolute: 0.6 10*3/uL (ref 0.2–0.9)
Monocytes Relative: 8 %
NEUTROS PCT: 77 %
Neutro Abs: 5.8 10*3/uL (ref 1.4–6.5)
PLATELETS: 108 10*3/uL — AB (ref 150–440)
RBC: 3.26 MIL/uL — ABNORMAL LOW (ref 3.80–5.20)
RDW: 15.5 % — AB (ref 11.5–14.5)
WBC: 7.5 10*3/uL (ref 3.6–11.0)

## 2014-09-14 LAB — CBC
HEMATOCRIT: 31.7 % — AB (ref 35.0–47.0)
Hemoglobin: 10.2 g/dL — ABNORMAL LOW (ref 12.0–16.0)
MCH: 30.1 pg (ref 26.0–34.0)
MCHC: 32.1 g/dL (ref 32.0–36.0)
MCV: 93.8 fL (ref 80.0–100.0)
Platelets: 108 10*3/uL — ABNORMAL LOW (ref 150–440)
RBC: 3.38 MIL/uL — AB (ref 3.80–5.20)
RDW: 15 % — ABNORMAL HIGH (ref 11.5–14.5)
WBC: 7.2 10*3/uL (ref 3.6–11.0)

## 2014-09-14 LAB — BASIC METABOLIC PANEL
Anion gap: 6 (ref 5–15)
BUN: 28 mg/dL — AB (ref 6–20)
CO2: 41 mmol/L — ABNORMAL HIGH (ref 22–32)
Calcium: 8.6 mg/dL — ABNORMAL LOW (ref 8.9–10.3)
Chloride: 96 mmol/L — ABNORMAL LOW (ref 101–111)
Creatinine, Ser: 0.69 mg/dL (ref 0.44–1.00)
GFR calc Af Amer: >60 mL/min (ref >60)
GLUCOSE: 135 mg/dL — AB (ref 65–99)
POTASSIUM: 4 mmol/L (ref 3.5–5.1)
Sodium: 143 mmol/L (ref 135–145)

## 2014-09-14 LAB — BLOOD GAS, ARTERIAL
ALLENS TEST (PASS/FAIL): POSITIVE — AB
Acid-Base Excess: 14.5 mmol/L — ABNORMAL HIGH (ref 0.0–3.0)
Bicarbonate: 46 mEq/L — ABNORMAL HIGH (ref 21.0–28.0)
FIO2: 100 %
O2 Saturation: 98 %
PH ART: 7.28 — AB (ref 7.350–7.450)
PO2 ART: 115 mmHg — AB (ref 83.0–108.0)
Patient temperature: 37
pCO2 arterial: 98 mmHg (ref 32.0–48.0)

## 2014-09-14 LAB — VITAMIN B12: Vitamin B-12: 230 pg/mL (ref 180–914)

## 2014-09-14 LAB — TROPONIN I

## 2014-09-14 LAB — GLUCOSE, CAPILLARY: Glucose-Capillary: 187 mg/dL — ABNORMAL HIGH (ref 65–99)

## 2014-09-14 LAB — FERRITIN: FERRITIN: 77 ng/mL (ref 11–307)

## 2014-09-14 LAB — IRON AND TIBC
Iron: 21 ug/dL — ABNORMAL LOW (ref 28–170)
Saturation Ratios: 8 % — ABNORMAL LOW (ref 10.4–31.8)
TIBC: 279 ug/dL (ref 250–450)
UIBC: 258 ug/dL

## 2014-09-14 MED ORDER — TRAZODONE HCL 100 MG PO TABS
100.0000 mg | ORAL_TABLET | Freq: Every day | ORAL | Status: DC
  Administered 2014-09-14 – 2014-10-02 (×17): 100 mg via ORAL
  Filled 2014-09-14 (×3): qty 2
  Filled 2014-09-14: qty 1
  Filled 2014-09-14: qty 2
  Filled 2014-09-14: qty 1
  Filled 2014-09-14 (×5): qty 2
  Filled 2014-09-14 (×6): qty 1

## 2014-09-14 MED ORDER — IPRATROPIUM-ALBUTEROL 0.5-2.5 (3) MG/3ML IN SOLN
3.0000 mL | Freq: Once | RESPIRATORY_TRACT | Status: AC
  Administered 2014-09-14: 3 mL via RESPIRATORY_TRACT
  Filled 2014-09-14: qty 3

## 2014-09-14 MED ORDER — METHYLPREDNISOLONE SODIUM SUCC 125 MG IJ SOLR
60.0000 mg | Freq: Four times a day (QID) | INTRAMUSCULAR | Status: DC
  Administered 2014-09-14 – 2014-09-17 (×13): 60 mg via INTRAVENOUS
  Filled 2014-09-14 (×13): qty 2

## 2014-09-14 MED ORDER — HALOPERIDOL LACTATE 5 MG/ML IJ SOLN
2.5000 mg | INTRAMUSCULAR | Status: DC | PRN
  Administered 2014-09-14 (×2): 2.5 mg via INTRAVENOUS
  Filled 2014-09-14 (×3): qty 1

## 2014-09-14 MED ORDER — SENNOSIDES-DOCUSATE SODIUM 8.6-50 MG PO TABS: 1.0000 | ORAL_TABLET | Freq: Every evening | ORAL | Status: DC | PRN

## 2014-09-14 MED ORDER — TIOTROPIUM BROMIDE MONOHYDRATE 18 MCG IN CAPS
18.0000 ug | ORAL_CAPSULE | Freq: Every day | RESPIRATORY_TRACT | Status: DC
  Administered 2014-09-15 – 2014-10-03 (×19): 18 ug via RESPIRATORY_TRACT
  Filled 2014-09-14 (×4): qty 5

## 2014-09-14 MED ORDER — MORPHINE SULFATE 2 MG/ML IJ SOLN
INTRAMUSCULAR | Status: AC
  Administered 2014-09-14: 2 mg via INTRAVENOUS
  Filled 2014-09-14: qty 1

## 2014-09-14 MED ORDER — SERTRALINE HCL 50 MG PO TABS
100.0000 mg | ORAL_TABLET | Freq: Every day | ORAL | Status: DC
  Administered 2014-09-14 – 2014-10-03 (×19): 100 mg via ORAL
  Filled 2014-09-14 (×20): qty 2

## 2014-09-14 MED ORDER — DEXTROSE 5 % IV SOLN
500.0000 mg | Freq: Once | INTRAVENOUS | Status: AC
  Administered 2014-09-14: 500 mg via INTRAVENOUS
  Filled 2014-09-14: qty 500

## 2014-09-14 MED ORDER — LORAZEPAM 1 MG PO TABS: 1.0000 mg | ORAL_TABLET | Freq: Three times a day (TID) | ORAL | Status: DC | PRN

## 2014-09-14 MED ORDER — DEXTROSE 5 % IV SOLN
500.0000 mg | INTRAVENOUS | Status: DC
  Filled 2014-09-14: qty 500

## 2014-09-14 MED ORDER — APIXABAN 5 MG PO TABS
5.0000 mg | ORAL_TABLET | Freq: Two times a day (BID) | ORAL | Status: DC
  Administered 2014-09-14 – 2014-10-03 (×36): 5 mg via ORAL
  Filled 2014-09-14 (×37): qty 1

## 2014-09-14 MED ORDER — ENOXAPARIN SODIUM 40 MG/0.4ML ~~LOC~~ SOLN
40.0000 mg | Freq: Two times a day (BID) | SUBCUTANEOUS | Status: DC
Start: 2014-09-14 — End: 2014-09-14
  Administered 2014-09-14: 40 mg via SUBCUTANEOUS
  Filled 2014-09-14: qty 0.4

## 2014-09-14 MED ORDER — ONDANSETRON HCL 4 MG PO TABS
4.0000 mg | ORAL_TABLET | Freq: Four times a day (QID) | ORAL | Status: DC | PRN
  Administered 2014-09-20 – 2014-09-25 (×2): 4 mg via ORAL
  Filled 2014-09-14 (×2): qty 1

## 2014-09-14 MED ORDER — MORPHINE SULFATE 2 MG/ML IJ SOLN
2.0000 mg | INTRAMUSCULAR | Status: DC | PRN
  Administered 2014-09-14 – 2014-09-15 (×3): 2 mg via INTRAVENOUS
  Filled 2014-09-14 (×2): qty 1

## 2014-09-14 MED ORDER — METHYLPREDNISOLONE SODIUM SUCC 125 MG IJ SOLR
125.0000 mg | Freq: Once | INTRAMUSCULAR | Status: AC
  Administered 2014-09-14: 125 mg via INTRAVENOUS
  Filled 2014-09-14: qty 2

## 2014-09-14 MED ORDER — IPRATROPIUM-ALBUTEROL 0.5-2.5 (3) MG/3ML IN SOLN
3.0000 mL | Freq: Four times a day (QID) | RESPIRATORY_TRACT | Status: DC
  Administered 2014-09-14 – 2014-09-17 (×13): 3 mL via RESPIRATORY_TRACT
  Filled 2014-09-14 (×14): qty 3

## 2014-09-14 MED ORDER — ACETAMINOPHEN 325 MG PO TABS
650.0000 mg | ORAL_TABLET | Freq: Four times a day (QID) | ORAL | Status: DC | PRN
  Administered 2014-09-19 – 2014-09-25 (×4): 650 mg via ORAL
  Filled 2014-09-14 (×4): qty 2

## 2014-09-14 MED ORDER — SODIUM CHLORIDE 0.9 % IV SOLN
INTRAVENOUS | Status: DC
  Administered 2014-09-14 – 2014-09-15 (×3): via INTRAVENOUS

## 2014-09-14 MED ORDER — DOCUSATE SODIUM 100 MG PO CAPS
200.0000 mg | ORAL_CAPSULE | Freq: Two times a day (BID) | ORAL | Status: DC
  Administered 2014-09-14 – 2014-09-25 (×15): 200 mg via ORAL
  Filled 2014-09-14 (×18): qty 2

## 2014-09-14 MED ORDER — ACETAMINOPHEN 650 MG RE SUPP: 650.0000 mg | Freq: Four times a day (QID) | RECTAL | Status: DC | PRN

## 2014-09-14 MED ORDER — ONDANSETRON HCL 4 MG/2ML IJ SOLN
4.0000 mg | Freq: Four times a day (QID) | INTRAMUSCULAR | Status: DC | PRN
  Administered 2014-09-15 – 2014-09-19 (×2): 4 mg via INTRAVENOUS
  Filled 2014-09-14 (×2): qty 2

## 2014-09-14 NOTE — H&P (Signed)
[facility] [place] at [facility]   PATIENT NAME: Eileen [name]    MR#:  [id number]  DATE OF BIRTH:  09-29-1945  DATE OF ADMISSION:  09/14/2014  PRIMARY CARE PHYSICIAN: Pcp Not In System Dr. Selinda FlavinMetrona  REQUESTING/REFERRING PHYSICIAN: Dr. Gwendolyn GrantGrady Goodman  CHIEF COMPLAINT:   Chief Complaint  Patient presents with  . Shortness of Breath   cough with expectoration  HISTORY OF PRESENT ILLNESS:  Eileen PeaShirley Bier  is a 69 y.o. female with a known history of COPD on home oxygen 4 L/m, degenerative joint disease was brought into the emergency room with EMS with the complaints of ongoing shortness of breath with wheezing and hypoxia with O2 saturations in the upper 70s to 80s. Patient states that she has been having progressively worsening shortness of breath for the past few days which further worsened in the past 2 days. Also gives history of ongoing cough with expectoration of brown sputum for the past 2 days. Denies any fever or chills. No chest pain. No nausea, vomiting, diarrhea, abdominal pain. Patient was placed on nonrebreather mask with 15 L of oxygen, was given DuoNeb's. EMS and brought to the emergency room for further evaluation. In the emergency room patient received further doses of DuoNeb's, was given IV Solu-Medrol and Zithromax, continued on O2 supplementation through nonrebreather mask. Hospitalist service was consulted for further management.  PAST MEDICAL HISTORY:   Past Medical History  Diagnosis Date  . COPD (chronic obstructive pulmonary disease)   . Arthritis     PAST SURGICAL HISTORY:   Past Surgical History  Procedure Laterality Date  . Laminectomy    . Back surgery      SOCIAL HISTORY:   History  Substance Use Topics  . Smoking status: Never Smoker   . Smokeless tobacco: Never Used  . Alcohol Use: No    FAMILY HISTORY:   Family History  Problem Relation Age of Onset  . CAD Mother   . Diabetes type II Mother   . Hypertension  Father   . CAD Father     DRUG ALLERGIES:   Allergies  Allergen Reactions  . Other Other (See Comments)    darvocet--pt reports blistering with this medication  . Penicillins Nausea And Vomiting  . Darvon [Propoxyphene] Other (See Comments)    Blisters    REVIEW OF SYSTEMS:   Review of Systems  Constitutional: Negative for fever, chills and malaise/fatigue.  HENT: Negative for ear pain, hearing loss, nosebleeds, sore throat and tinnitus.   Eyes: Negative for blurred vision, double vision, pain, discharge and redness.  Respiratory: Positive for cough, sputum production, shortness of breath and wheezing. Negative for hemoptysis.   Cardiovascular: Negative for chest pain, palpitations, orthopnea and leg swelling.  Gastrointestinal: Negative for nausea, vomiting, abdominal pain, diarrhea, constipation, blood in stool and melena.  Genitourinary: Negative for dysuria, urgency, frequency and hematuria.  Musculoskeletal: Positive for back pain. Negative for joint pain and neck pain.  Skin: Negative for itching and rash.  Neurological: Negative for dizziness, tingling, sensory change, focal weakness and seizures.  Endo/Heme/Allergies: Does not bruise/bleed easily.  Psychiatric/Behavioral: Negative for depression. The patient is not nervous/anxious.     MEDICATIONS AT HOME:   Prior to Admission medications   Not on File      VITAL SIGNS:  Blood pressure 107/85, pulse 65, temperature 98.9 F (37.2 C), temperature source Axillary, resp. rate 21, height 4' 11.75" (1.518 m), weight 169.1 kg (372 lb 12.8 oz), SpO2 80 %.  PHYSICAL  EXAMINATION:  Physical Exam  Constitutional: She is oriented to person, place, and time. She appears well-nourished. No distress.  Obese +  HENT:  Head: Normocephalic and atraumatic.  Right Ear: External ear normal.  Left Ear: External ear normal.  Nose: Nose normal.  Mouth/Throat: Oropharynx is clear and moist. No oropharyngeal exudate.  Eyes: EOM are  normal. Pupils are equal, round, and reactive to light. No scleral icterus.  Neck: Normal range of motion. Neck supple. No JVD present. No thyromegaly present.  Cardiovascular: Normal rate, regular rhythm, normal heart sounds and intact distal pulses.  Exam reveals no friction rub.   No murmur heard. Respiratory: Effort normal. No respiratory distress. She has wheezes. She has no rales. She exhibits no tenderness.  Diminished air entry bilaterally  GI: Soft. Bowel sounds are normal. She exhibits no distension and no mass. There is no tenderness. There is no rebound and no guarding.  Musculoskeletal: Normal range of motion. She exhibits no edema.  Lymphadenopathy:    She has no cervical adenopathy.  Neurological: She is alert and oriented to person, place, and time. She has normal reflexes. She displays normal reflexes. No cranial nerve deficit. She exhibits normal muscle tone.  Skin: Skin is warm. No rash noted. No erythema.  Psychiatric: She has a normal mood and affect. Her behavior is normal. Thought content normal.   LABORATORY PANEL:   CBC  Recent Labs Lab 09/14/14 0101  WBC 7.5  HGB 9.6*  HCT 30.6*  PLT 108*   ------------------------------------------------------------------------------------------------------------------  Chemistries   Recent Labs Lab 09/14/14 0101  NA 143  K 4.0  CL 96*  CO2 41*  GLUCOSE 135*  BUN 28*  CREATININE 0.69  CALCIUM 8.6*   ------------------------------------------------------------------------------------------------------------------  Cardiac Enzymes  Recent Labs Lab 09/14/14 0101  TROPONINI <0.03   ------------------------------------------------------------------------------------------------------------------  RADIOLOGY:  Dg Chest Portable 1 View  09/14/2014   CLINICAL DATA:  Shortness of breath  EXAM: PORTABLE CHEST - 1 VIEW  COMPARISON:  10/18/2013  FINDINGS: Marked hypoventilation with bilateral perihilar opacity which  is indeterminate. There is pulmonary venous congestion and cardiomegaly. No evidence of effusion or pneumothorax.  IMPRESSION: 1. Marked hypoventilation. Perihilar opacities could reflect atelectasis or pneumonia. 2. Cardiomegaly and pulmonary venous congestion.   Electronically Signed   By: Marnee Spring M.D.   On: 09/14/2014 01:28    EKG:   Orders placed or performed during the hospital encounter of 09/14/14  . EKG 12-Lead sinus bradycardia with ventricular rate of 55 bpm.   . EKG 12-Lead    IMPRESSION AND PLAN:   1. Acute on chronic respiratory failure with hypoxia secondary to COPD exacerbation. 2. COPD exacerbation. 3. Acute bronchitis versus pneumonia, community-acquired. 4. History of DJD, stable. 5. Anemia,? Chronic.   Plan: Admit to MedSurg, continue O2 supplementation, monitor O2 sats, vigorous DuoNeb's, IV Solu-Medrol, continue IV Zithromax, follow-up sputum culture, continue Spiriva. -Monitor CBC, check iron studies, consider further workup for anemia accordingly.    All the records are reviewed and case discussed with ED provider. Management plans discussed with the patient, family and they are in agreement.  CODE STATUS: Full code  TOTAL TIME TAKING CARE OF THIS PATIENT: 50 minutes.    Jonnie Kind N M.D on 09/14/2014 at 2:41 AM  Between 7am to 6pm - Pager - [phone]  After 6pm go to [email or web address] - password [email or web address]  [facility]  Office  [phone]  CC: Primary care physician; Pcp Not In System

## 2014-09-14 NOTE — ED Notes (Signed)
Pt brought to ER by [facility] for Shortness of breath. Per EMS they were dispatched out to pt's residence earlier in the evening, pt was given a duoneb treatment, pt improved and refused to come to hospital at that time. EMS called out again to find pt on home O2 of 4L that pt normally wears, with a saturation in the high 70's to low 80's. Pt was placed on Non-rebreather at 15L and saturation improved to 98%. EMS attempted to put pt onto nasal cannula during transport but pt's O2 sat plummeted. Pt placed back on to Non-rebreather at 15L.  VS for EMS 128/64, HR 60 98% on Non-rebreather at 15L. Pt reports history of COPD.

## 2014-09-14 NOTE — Progress Notes (Signed)
[facility] [place] [facility]   PATIENT NAME: Eileen PeaShirley [name]    MR#:  [id number]  DATE OF BIRTH:  10-09-1945  SUBJECTIVE:  CHIEF COMPLAINT:   Chief Complaint  Patient presents with  . Shortness of Breath   Patient admitted due to COPD exacerbation. This a.m. patient was quite agitated. ABG showing evidence of hypercarbic respiratory failure. Patient also admits to suicidal ideations.  REVIEW OF SYSTEMS:    Review of Systems  Constitutional: Negative for fever and chills.  HENT: Negative for congestion and tinnitus.   Eyes: Negative for blurred vision and double vision.  Respiratory: Positive for shortness of breath and wheezing. Negative for cough.   Cardiovascular: Negative for chest pain, orthopnea and PND.  Gastrointestinal: Negative for nausea, vomiting, abdominal pain and diarrhea.  Genitourinary: Negative for dysuria and hematuria.  Neurological: Negative for dizziness, sensory change and focal weakness.  Psychiatric/Behavioral: Positive for depression and suicidal ideas.  All other systems reviewed and are negative.   Nutrition: Heart Healthy Tolerating Diet: Yes Tolerating PT: Await Eval  DRUG ALLERGIES:   Allergies  Allergen Reactions  . Other Other (See Comments)    darvocet--pt reports blistering with this medication  . Penicillins Nausea And Vomiting  . Darvon [Propoxyphene] Other (See Comments)    Blisters    VITALS:  Blood pressure 127/44, pulse 61, temperature 98.1 F (36.7 C), temperature source Oral, resp. rate 22, height 4' 11.75" (1.518 m), weight 168.647 kg (371 lb 12.8 oz), SpO2 95 %.  PHYSICAL EXAMINATION:   Physical Exam  GENERAL:  69 y.o.-year-old obese patient lying in the bed with no acute distress.  EYES: Pupils equal, round, reactive to light and accommodation. No scleral icterus. Extraocular muscles intact.  HEENT: Head atraumatic, normocephalic. Oropharynx and nasopharynx clear.  NECK:  Supple, no  jugular venous distention. No thyroid enlargement, no tenderness.  LUNGS: Prolonged inspiratory and expiratory effort. Positive diffuse wheezing. No dullness to percussion. No use of accessory muscles of respiration.  CARDIOVASCULAR: S1, S2 RRR. No murmurs, rubs, or gallops.  ABDOMEN: Soft, nontender, nondistended. Bowel sounds present. No organomegaly or mass.  EXTREMITIES: No cyanosis, clubbing or edema b/l.    NEUROLOGIC: Cranial nerves II through XII are intact. No focal Motor or sensory deficits b/l.   PSYCHIATRIC: The patient is alert and oriented x 3. Good Affect.   SKIN: No obvious rash, lesion, or ulcer.    LABORATORY PANEL:   CBC  Recent Labs Lab 09/14/14 0510  WBC 7.2  HGB 10.2*  HCT 31.7*  PLT 108*   ------------------------------------------------------------------------------------------------------------------  Chemistries   Recent Labs Lab 09/14/14 0101  NA 143  K 4.0  CL 96*  CO2 41*  GLUCOSE 135*  BUN 28*  CREATININE 0.69  CALCIUM 8.6*   ------------------------------------------------------------------------------------------------------------------  Cardiac Enzymes  Recent Labs Lab 09/14/14 0101  TROPONINI <0.03   ------------------------------------------------------------------------------------------------------------------  RADIOLOGY:  Dg Chest Portable 1 View  09/14/2014   CLINICAL DATA:  Shortness of breath  EXAM: PORTABLE CHEST - 1 VIEW  COMPARISON:  10/18/2013  FINDINGS: Marked hypoventilation with bilateral perihilar opacity which is indeterminate. There is pulmonary venous congestion and cardiomegaly. No evidence of effusion or pneumothorax.  IMPRESSION: 1. Marked hypoventilation. Perihilar opacities could reflect atelectasis or pneumonia. 2. Cardiomegaly and pulmonary venous congestion.   Electronically Signed   By: Marnee SpringJonathon  Watts M.D.   On: 09/14/2014 01:28     ASSESSMENT AND PLAN:   69 year old female with past medical history  of hypertension, COPD with ongoing  tobacco abuse, osteoarthritis who presented to the hospital due to shortness of breath cough or wheezing and noted to be in COPD exacerbation.  #1 acute on chronic hypercarbic hypoxic respiratory failure-this is likely secondary to patient's COPD with exacerbation. -Patient was quite agitated earlier but has calmed down with some Haldol. We'll attempt to place BiPAP and follow clinically. -Continue treatment for underlying COPD exacerbation.  #2 COPD exacerbation-likely due to ongoing tobacco abuse. -Continue IV steroids, DuoNeb nebs around-the-clock, Spiriva. -Assessed for home O2 prior to discharge. Continue Zithromax for possible underlying bronchitis  #3 agitation/depression-patient developed an episode of agitation and confusion this morning and pulled her IV out. Patient received some Haldol and has improved. -Upon further questioning the patient she shares suicidal ideations. I will place a one-to-one sitter, get a psychiatric consult. -Continue when necessary Haldol.  #4 OSA - cont. Bipap as tolerated.   - follow ABG's.     All the records are reviewed and case discussed with Care Management/Social Workerr. Management plans discussed with the patient, family and they are in agreement.  CODE STATUS: Full  DVT Prophylaxis: Lovenox  TOTAL CRITICAL CARE TIME TAKING CARE OF THIS PATIENT: 35 minutes.   POSSIBLE D/C IN 2-3 DAYS, DEPENDING ON CLINICAL CONDITION.   Houston Siren M.D on 09/14/2014 at 10:07 AM  Between 7am to 6pm - Pager - [phone]  After 6pm go to [email or web address] - password [email or web address]  [facility]  Office  [phone]  CC: Primary care physician; Pcp Not In System

## 2014-09-14 NOTE — Progress Notes (Addendum)
Report called to Thayer Ohmhris RN in ICU, pt transferred to ICU per MD orders  Husband notified of pts transfer to ICU
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
## 2014-09-14 NOTE — ED Provider Notes (Signed)
[facility] Emergency Department Provider Note    ____________________________________________  Time seen: On EMS arrival  I have reviewed the triage vital signs and the nursing notes.   HISTORY  Chief Complaint Shortness of Breath   History limited by: Not Limited   HPI Eileen [name] is a 69 y.o. female who presents to the emergency department for shortness of breath. She states that this is been getting worse for the past 6 weeks. Has been worse the past couple of days. She does state that she has been out of her nebulizer medications for the past 3 weeks. She has had some associated cough and this brought up some mucus. No chest pain. Denies any fevers. EMS did go to her house earlier today and given a DuoNeb treatment. Patient then refused transport after the DuoNeb.     No past medical history on file.  There are no active problems to display for this patient.   No past surgical history on file.  No current outpatient prescriptions on file.  Allergies Review of patient's allergies indicates not on file.  No family history on file.  Social History History  Substance Use Topics  . Smoking status: Not on file  . Smokeless tobacco: Not on file  . Alcohol Use: Not on file    Review of Systems  Constitutional: Negative for fever. Cardiovascular: Negative for chest pain. Respiratory: Positive for shortness of breath. Gastrointestinal: Negative for abdominal pain, vomiting and diarrhea. Genitourinary: Negative for dysuria. Musculoskeletal: Negative for back pain. Skin: Negative for rash. Neurological: Negative for headaches, focal weakness or numbness.   10-point ROS otherwise negative.  ____________________________________________   PHYSICAL EXAM:  VITAL SIGNS:  98.9 F (37.2 C)  63  19  91/66 mmHg  100 %  15 L/min    Constitutional: Alert and oriented. In mild respiratory distress Eyes: Conjunctivae are normal. PERRL.  Normal extraocular movements. ENT   Head: Normocephalic and atraumatic.   Nose: No congestion/rhinnorhea.   Mouth/Throat: Mucous membranes are moist.   Neck: No stridor. Hematological/Lymphatic/Immunilogical: No cervical lymphadenopathy. Cardiovascular: Normal rate, regular rhythm.  No murmurs, rubs, or gallops. Respiratory: Increased respiratory effort, in mild respiratory distress. Breath sounds are very diminished bilaterally, exam is  limited by body habitus. Gastrointestinal: Soft and nontender. No distention.  Genitourinary: Deferred Musculoskeletal: Normal range of motion in all extremities.  Neurologic:  Normal speech and language. No gross focal neurologic deficits are appreciated. Speech is normal.  Skin:  Skin is warm, dry and intact. No rash noted. Psychiatric: Mood and affect are normal. Speech and behavior are normal. Patient exhibits appropriate insight and judgment.  ____________________________________________    LABS (pertinent positives/negatives)  Labs Reviewed  CBC WITH DIFFERENTIAL/PLATELET - Abnormal; Notable for the following:    RBC 3.26 (*)    Hemoglobin 9.6 (*)    HCT 30.6 (*)    MCHC 31.3 (*)    RDW 15.5 (*)    Platelets 108 (*)    Lymphs Abs 0.9 (*)    All other components within normal limits  BASIC METABOLIC PANEL - Abnormal; Notable for the following:    Chloride 96 (*)    CO2 41 (*)    Glucose, Bld 135 (*)    BUN 28 (*)    Calcium 8.6 (*)    All other components within normal limits  TROPONIN I     ____________________________________________   EKG  I, Phineas Semen, attending physician, personally viewed and interpreted this EKG  EKG Time:  [id number] Rate: 55 Rhythm: Sinus bradycardia Axis: Left axis deviation Intervals: qtc 394 QRS: Narrow ST changes: No ST elevation    ____________________________________________    RADIOLOGY  Chest x-ray IMPRESSION: 1. Marked hypoventilation. Perihilar opacities could  reflect atelectasis or pneumonia. 2. Cardiomegaly and pulmonary venous congestion. ____________________________________________   PROCEDURES  Procedure(s) performed: None  Critical Care performed: Yes, see critical care note(s)  CRITICAL CARE Performed by: Phineas SemenGOODMAN, Sandralee Tarkington   Total critical care time: 30  Critical care time was exclusive of separately billable procedures and treating other patients.  Critical care was necessary to treat or prevent imminent or life-threatening deterioration.  Critical care was time spent personally by me on the following activities: development of treatment plan with patient and/or surrogate as well as nursing, discussions with consultants, evaluation of patient's response to treatment, examination of patient, obtaining history from patient or surrogate, ordering and performing treatments and interventions, ordering and review of laboratory studies, ordering and review of radiographic studies, pulse oximetry and re-evaluation of patient's condition.  ____________________________________________   INITIAL IMPRESSION / ASSESSMENT AND PLAN / ED COURSE  Pertinent labs & imaging results that were available during my care of the patient were reviewed by me and considered in my medical decision making (see chart for details).  Patient presents to the emergency department today with shortness of breath. The patient had initial O2 sats in the 70s for first responders on home oxygen at 4 L. Here patient had very diminished breath sounds diffusely. Was given 3 DuoNeb times without significant relief. Additionally was given steroids and azithromycin. Just has a red with some opacities. At this point I highly doubt they represent pneumonia given lack of fever or significantly productive cough or chest pain or leukocytosis. Thus blood cultures were ordered from the emergency department. Given persistent respiratory distress I will admit to the hospital for further  management and workup.  ____________________________________________   FINAL CLINICAL IMPRESSION(S) / ED DIAGNOSES  Final diagnoses:  COPD exacerbation     Phineas SemenGraydon Alyse Kathan, MD 09/14/14 [id number]

## 2014-09-14 NOTE — Progress Notes (Signed)
Had a discussion with patient and patient's husband about goals of care.    Pt. Has agreed and she is a DNR.  She is o.k. With Bipap if needed.    Order for DNR placed in computer.  Palliative care consult pending.    Time spent: 15 min.

## 2014-09-14 NOTE — Care Management (Signed)
Admitted to 1C with the diagnosis of acute/chronic respiratory failure. Lives with husband, Gerlene BurdockRichard, [phone]) Sees Dr. Selinda FlavinMetrona. On 100% re-breather mask. Became agitated and restless. ABG drawn.  Transferred to ICU. Cyril LoosenWanda Scott representative for the Department of Social Serves here to talk to Ms. Torry. States that they have receives an Adult Protective Services referral. Gwenette GreetBrenda S Holland RN MSN Care Management [phone]

## 2014-09-14 NOTE — Progress Notes (Signed)
Pt acting out of the ordinary, different from her initial assessment as far as orientation. Currently, alert but disoriented to place, time and situation. Notified MD Dr Cherlynn KaiserSainani. Telephone order given for stat ABG. Pt on 100 % NRB with hx of COPD.Marland Kitchen.Geri Seminolerusilla A Jackson, RN
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
## 2014-09-14 NOTE — Progress Notes (Signed)
Pt currently resting.  Transfer from 1C today for declining respiratory status and mental status.  Pt was confused initially upon arrival to CCU.  After a couple hrs, pt mental status returned to normal after being on Hiflo.  Dr. Discussed code status with pt and husband at this time.  Changed to DNR.  Pt placed on bipap.  Pt fell asleep for several hrs then awoke and was completely disoriented and uncooperative.  Pt refused to continue with Bipap, and began pulling at all lines.  Given PRN meds to help with agitation and breathing.  Hiflo was able to be placed back on pt, but same remains agitated.  Sitter orders in place for same.

## 2014-09-14 NOTE — ED Notes (Signed)
Pt and husband do not know or have a list of the patients medication with them. Pt fills medications at [facility] [place]

## 2014-09-14 NOTE — Consult Note (Signed)
Palliative Medicine Inpatient Consult Note   Name: Eileen [name] Date: 09/14/2014 MRN: [id number]  DOB: 01-16-1946  Referring Physician: Houston Siren, MD  Palliative Care consult requested for this 69 y.o. female for goals of medical therapy in patient with severe chronic respiratory failure.    REVIEW OF SYSTEMS:  Patient is not able to provide ROS due to being delusional currently    SOCIAL HISTORY:  reports that she has never smoked. She has never used smokeless tobacco. She reports that she does not drink alcohol or use illicit drugs.  LEGAL DOCUMENTS:  None located  CODE STATUS: DNR  PAST MEDICAL HISTORY: Past Medical History  Diagnosis Date  . COPD (chronic obstructive pulmonary disease)   . Arthritis     PAST SURGICAL HISTORY:  Past Surgical History  Procedure Laterality Date  . Laminectomy    . Back surgery      ALLERGIES:  is allergic to other; penicillins; and darvon.  MEDICATIONS:  Current Facility-Administered Medications  Medication Dose Route Frequency Provider Last Rate Last Dose  . 0.9 %  sodium chloride infusion   Intravenous Continuous Crissie Figures, MD 50 mL/hr at 09/14/14 1900    . acetaminophen (TYLENOL) tablet 650 mg  650 mg Oral Q6H PRN Crissie Figures, MD       Or  . acetaminophen (TYLENOL) suppository 650 mg  650 mg Rectal Q6H PRN Crissie Figures, MD      . apixaban (ELIQUIS) tablet 5 mg  5 mg Oral BID Houston Siren, MD      . Melene Muller ON 09/15/2014] azithromycin (ZITHROMAX) 500 mg in dextrose 5 % 250 mL IVPB  500 mg Intravenous Q24H Crissie Figures, MD      . docusate sodium (COLACE) capsule 200 mg  200 mg Oral BID Houston Siren, MD   200 mg at 09/14/14 1103  . haloperidol lactate (HALDOL) injection 2.5 mg  2.5 mg Intravenous Q4H PRN Houston Siren, MD   2.5 mg at 09/14/14 1724  . ipratropium-albuterol (DUONEB) 0.5-2.5 (3) MG/3ML nebulizer solution 3 mL  3 mL Nebulization Q6H Crissie Figures, MD   3 mL at 09/14/14 2006  .  LORazepam (ATIVAN) tablet 1 mg  1 mg Oral Q8H PRN Houston Siren, MD      . methylPREDNISolone sodium succinate (SOLU-MEDROL) 125 mg/2 mL injection 60 mg  60 mg Intravenous Q6H Crissie Figures, MD   60 mg at 09/14/14 1928  . morphine 2 MG/ML injection 2 mg  2 mg Intravenous Q4H PRN Erin Fulling, MD   2 mg at 09/14/14 1721  . ondansetron (ZOFRAN) tablet 4 mg  4 mg Oral Q6H PRN Crissie Figures, MD       Or  . ondansetron [facility]) injection 4 mg  4 mg Intravenous Q6H PRN Crissie Figures, MD      . senna-docusate (Senokot-S) tablet 1 tablet  1 tablet Oral QHS PRN Crissie Figures, MD      . sertraline (ZOLOFT) tablet 100 mg  100 mg Oral Daily Houston Siren, MD   100 mg at 09/14/14 1248  . tiotropium (SPIRIVA) inhalation capsule 18 mcg  18 mcg Inhalation Daily Crissie Figures, MD   18 mcg at 09/14/14 [phone]  . traZODone (DESYREL) tablet 100 mg  100 mg Oral QHS Houston Siren, MD        Vital Signs: BP 147/66 mmHg  Pulse 64  Temp(Src) 98 F (36.7 C) (Axillary)  Resp 21  Ht 4' 11.75" (1.518 m)  Wt 168.647 kg (371 lb 12.8 oz)  BMI 73.19 kg/m2  SpO2 92% Filed Weights   09/14/14 0047 09/14/14 0351  Weight: 169.1 kg (372 lb 12.8 oz) 168.647 kg (371 lb 12.8 oz)    Estimated body mass index is 73.19 kg/(m^2) as calculated from the following:   Height as of this encounter: 4' 11.75" (1.518 m).   Weight as of this encounter: 168.647 kg (371 lb 12.8 oz).  PERFORMANCE STATUS (ECOG) : 3 - Symptomatic, >50% confined to bed  PHYSICAL EXAM: She is upset and talking a bit of paranoid nonsense --even paranoid of what I am trying to 'convince her of' Wide open staring eyes Nares patent  OP clear Neck w/o JVD or visible thyromegaly Heart rrr no mgr Lungs ronchi Abd obese (morbidly) with nl BS   LABS: CBC:    Component Value Date/Time   WBC 7.2 09/14/2014 0510   WBC 7.8 10/15/2013 0348   HGB 10.2* 09/14/2014 0510   HGB 9.9* 10/18/2013 0425   HCT 31.7* 09/14/2014 0510   HCT 32.0*  10/15/2013 0348   PLT 108* 09/14/2014 0510   PLT 92* 10/15/2013 0348   MCV 93.8 09/14/2014 0510   MCV 88 10/15/2013 0348   NEUTROABS 5.8 09/14/2014 0101   NEUTROABS 7.2* 10/15/2013 0348   LYMPHSABS 0.9* 09/14/2014 0101   LYMPHSABS 0.5* 10/15/2013 0348   MONOABS 0.6 09/14/2014 0101   MONOABS 0.1* 10/15/2013 0348   EOSABS 0.1 09/14/2014 0101   EOSABS 0.0 10/15/2013 0348   BASOSABS 0.0 09/14/2014 0101   BASOSABS 0.0 10/15/2013 0348   Comprehensive Metabolic Panel:    Component Value Date/Time   NA 143 09/14/2014 0101   NA 141 10/18/2013 0425   K 4.0 09/14/2014 0101   K 5.4* 10/18/2013 0425   CL 96* 09/14/2014 0101   CL 101 10/18/2013 0425   CO2 41* 09/14/2014 0101   CO2 37* 10/18/2013 0425   BUN 28* 09/14/2014 0101   BUN 34* 10/18/2013 0425   CREATININE 0.69 09/14/2014 0101   CREATININE 1.13 10/18/2013 0425   GLUCOSE 135* 09/14/2014 0101   GLUCOSE 96 10/18/2013 0425   CALCIUM 8.6* 09/14/2014 0101   CALCIUM 7.8* 10/18/2013 0425   AST 18 10/11/2013 0252   ALT 15 10/11/2013 0252   ALKPHOS 72 10/11/2013 0252   PROT 6.0* 10/11/2013 0252   ALBUMIN 1.5* 10/11/2013 0252    IMPRESSION: Advanced COPD with acute on chronic respiratory failure with hypercapnia and hypoxemia 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   The patient is now delusional.   There is not any family currently here to talk with regarding goals of care or palliative care.   Also, there is no palliative coverage for the next couple of weeks.  So, at this point, I have little more to offer --today/ now.     We certainly can be reconsulted if she is still hospitalized when I return on June 25th, or if she is readmitted at some point in time. Should she deteriorate instead of improve --and should Hospice become  a consideration--please contact the case manager/social worker to help facilitate  a Hospice Consult if indicated.     More than 50% of the visit was spent in counseling/coordination of care: No:   Time Spent: 50 min

## 2014-09-14 NOTE — Consult Note (Signed)
  I was unable to see Eileen [name] today.  Dr. Guss Bundehalla will assess the patient tomorrow.

## 2014-09-15 LAB — MRSA PCR SCREENING: MRSA by PCR: NEGATIVE

## 2014-09-15 MED ORDER — AZITHROMYCIN 250 MG PO TABS
500.0000 mg | ORAL_TABLET | Freq: Every day | ORAL | Status: DC
  Administered 2014-09-15: 500 mg via ORAL
  Filled 2014-09-15: qty 2

## 2014-09-15 MED ORDER — HALOPERIDOL 0.5 MG PO TABS
1.0000 mg | ORAL_TABLET | Freq: Two times a day (BID) | ORAL | Status: DC
  Administered 2014-09-15: 1 mg via ORAL
  Filled 2014-09-15 (×3): qty 2

## 2014-09-15 NOTE — Progress Notes (Signed)
Patient ID: Eileen [name], female   DOB: 1945-04-15, 69 y.o.   MRN: [id number] [facility] Physicians PROGRESS NOTE  HPI/Subjective: Patient asking when she can go home. She states her breathing is fine. She states that she does not want to go on the BiPAP machine. She refused physical therapy and states that she does not walk at all.  Objective: Filed Vitals:   09/15/14 1000  BP: 97/47  Pulse: 57  Temp:   Resp: 25    Intake/Output Summary (Last 24 hours) at 09/15/14 1223 Last data filed at 09/15/14 1000  Gross per 24 hour  Intake   1440 ml  Output    100 ml  Net   1340 ml   Filed Weights   09/14/14 0047 09/14/14 0351  Weight: 169.1 kg (372 lb 12.8 oz) 168.647 kg (371 lb 12.8 oz)    ROS: Review of Systems  Constitutional: Negative for fever and chills.  Eyes: Negative for blurred vision.  Respiratory: Negative for cough and shortness of breath.   Cardiovascular: Negative for chest pain.  Gastrointestinal: Negative for nausea, vomiting, abdominal pain, diarrhea and constipation.  Genitourinary: Negative for dysuria.  Musculoskeletal: Negative for joint pain.  Neurological: Negative for dizziness and headaches.   Exam: Physical Exam  HENT:  Nose: No mucosal edema.  Mouth/Throat: No oropharyngeal exudate or posterior oropharyngeal edema.  Eyes: Conjunctivae, EOM and lids are normal. Pupils are equal, round, and reactive to light.  Neck: No JVD present. Carotid bruit is not present. No edema present. No thyroid mass and no thyromegaly present.  Cardiovascular: S1 normal and S2 normal.  Exam reveals no gallop.   Murmur heard.  Systolic murmur is present with a grade of 4/6  Pulses:      Dorsalis pedis pulses are 2+ on the right side, and 2+ on the left side.  Respiratory: No respiratory distress. She has decreased breath sounds in the right middle field, the right lower field, the left middle field and the left lower field. She has no wheezes. She has no rhonchi.  She has no rales.  GI: Soft. Bowel sounds are normal. There is no tenderness.  Musculoskeletal:       Right ankle: She exhibits swelling.       Left ankle: She exhibits swelling.  Lymphadenopathy:    She has no cervical adenopathy.  Neurological: She is alert. No cranial nerve deficit.  Skin: Skin is warm. No rash noted. Nails show no clubbing.  Psychiatric: She has a normal mood and affect.    Data Reviewed: Basic Metabolic Panel:  Recent Labs Lab 09/14/14 0101  NA 143  K 4.0  CL 96*  CO2 41*  GLUCOSE 135*  BUN 28*  CREATININE 0.69  CALCIUM 8.6*    CBC:  Recent Labs Lab 09/14/14 0101 09/14/14 0510  WBC 7.5 7.2  NEUTROABS 5.8  --   HGB 9.6* 10.2*  HCT 30.6* 31.7*  MCV 93.8 93.8  PLT 108* 108*     Recent Results (from the past 240 hour(s))  MRSA PCR Screening     Status: None   Collection Time: 09/14/14  7:50 PM  Result Value Ref Range Status   MRSA by PCR NEGATIVE NEGATIVE Final    Comment:        The GeneXpert MRSA Assay (FDA approved for NASAL specimens only), is one component of a comprehensive MRSA colonization surveillance program. It is not intended to diagnose MRSA infection nor to guide or monitor treatment for MRSA infections.  Studies: Dg Chest Portable 1 View  09/14/2014   CLINICAL DATA:  Shortness of breath  EXAM: PORTABLE CHEST - 1 VIEW  COMPARISON:  10/18/2013  FINDINGS: Marked hypoventilation with bilateral perihilar opacity which is indeterminate. There is pulmonary venous congestion and cardiomegaly. No evidence of effusion or pneumothorax.  IMPRESSION: 1. Marked hypoventilation. Perihilar opacities could reflect atelectasis or pneumonia. 2. Cardiomegaly and pulmonary venous congestion.   Electronically Signed   By: Marnee SpringJonathon  Watts M.D.   On: 09/14/2014 01:28    Scheduled Meds: . apixaban  5 mg Oral BID  . azithromycin  500 mg Intravenous Q24H  . docusate sodium  200 mg Oral BID  . ipratropium-albuterol  3 mL Nebulization Q6H   . methylPREDNISolone (SOLU-MEDROL) injection  60 mg Intravenous Q6H  . sertraline  100 mg Oral Daily  . tiotropium  18 mcg Inhalation Daily  . traZODone  100 mg Oral QHS   Continuous Infusions: . sodium chloride 50 mL/hr at 09/15/14 0133    Assessment/Plan:  1. Acute on chronic respiratory failure with hypoxia. COPD exacerbation.At home the patient wears 4 L nasal cannula. Here is on high flow nasal cannula 77% oxygen. I advised that I can't send the patient home on this much oxygen.continue Solu-Medrol high-dose, Spiriva, nebulizer treatments and Zithromax. 2. Patient is on full anticoagulation with Eliquis. High risk of blood clot with being on ambulatory. 3. Morbid obesity and likely sleep apnea-patient currently on high flow nasal cannula. 4. Depression- on Zoloft  Code Status:     Code Status Orders        Start     Ordered   09/14/14 1323  Do not attempt resuscitation (DNR)   Continuous    Question Answer Comment  In the event of cardiac or respiratory ARREST Do not call a "code blue"   In the event of cardiac or respiratory ARREST Do not perform Intubation, CPR, defibrillation or ACLS   In the event of cardiac or respiratory ARREST Use medication by any route, position, wound care, and other measures to relive pain and suffering. May use oxygen, suction and manual treatment of airway obstruction as needed for comfort.      09/14/14 1322    Advance Directive Documentation        Most Recent Value   Type of Advance Directive  Healthcare Power of Attorney, Living will   Pre-existing out of facility DNR order (yellow form or pink MOST form)     "MOST" Form in Place?       Family Communication: Husband at bedside Disposition Plan: Once off high flow nasal cannula and potentially send home.   Time spent: 25 minutes  Alford HighlandWIETING, Deondray Ospina  [facility]

## 2014-09-15 NOTE — Consult Note (Signed)
  Pt seen in ICU 11. S Pt is a 69 yr old white female, who has no past H/O MI and is in ICU for medical problems, Past Psych History  None. Pt was seen for eval of hallucinations. Alcohol and Drugs denied. M.s. Alert and ox3. Anxious.  Affect is depressed and feels low and down and upset. Feels h/h but denies feelign w/u. Denies s/h ideas or plans and wants to get help. Pt  Is irritable and is paranoid and thinks everybody is against her and irritable when questions are asked. I/J guarded. Imp MDD with Psychosis/. Recommend continue anti-depresent meds and add haldol one mg po bid.
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
## 2014-09-15 NOTE — Plan of Care (Signed)
Problem: Discharge Progression Outcomes Goal: Activity appropriate for discharge plan Outcome: Not Progressing Pt is chronically bedbound

## 2014-09-15 NOTE — Progress Notes (Signed)
  CONCERNING: Antibiotic IV to Oral Route Change Policy  RECOMMENDATION: This patient is receiving azithromycin by the intravenous route.  Based on criteria approved by the Pharmacy and Therapeutics Committee, the antibiotic(s) is/are being converted to the equivalent oral dose form(s).   DESCRIPTION: These criteria include:  Patient being treated for a respiratory tract infection, urinary tract infection, cellulitis or clostridium difficile associated diarrhea if on metronidazole  The patient is not neutropenic and does not exhibit a GI malabsorption state  The patient is eating (either orally or via tube) and/or has been taking other orally administered medications for a least 24 hours  The patient is improving clinically and has a Tmax < 100.5  If you have questions about this conversion, please contact the Pharmacy Department  []   [phone]( [phone] )  [facility] [x]   [phone]( [phone] )  [facility] []   [phone]( [phone] )  [facility] []   [phone]( [phone] )  [facility] []   [phone]( [phone] )  [facility]   Clarisa Schoolsrystal Maan Zarcone, PharmD Clinical Pharmacist 09/15/2014

## 2014-09-16 ENCOUNTER — Inpatient Hospital Stay: Payer: PPO

## 2014-09-16 MED ORDER — DILTIAZEM HCL 25 MG/5ML IV SOLN
INTRAVENOUS | Status: AC
  Administered 2014-09-16: 10 mg via INTRAVENOUS
  Filled 2014-09-16: qty 5

## 2014-09-16 MED ORDER — CHLORHEXIDINE GLUCONATE 0.12 % MT SOLN
15.0000 mL | Freq: Two times a day (BID) | OROMUCOSAL | Status: DC
  Administered 2014-09-16 – 2014-09-19 (×6): 15 mL via OROMUCOSAL

## 2014-09-16 MED ORDER — FUROSEMIDE 10 MG/ML IJ SOLN
80.0000 mg | Freq: Once | INTRAMUSCULAR | Status: AC
  Administered 2014-09-16: 80 mg via INTRAVENOUS
  Filled 2014-09-16: qty 8

## 2014-09-16 MED ORDER — MORPHINE SULFATE (CONCENTRATE) 10 MG/0.5ML PO SOLN
10.0000 mg | ORAL | Status: DC | PRN
  Administered 2014-09-19 – 2014-09-20 (×2): 10 mg via ORAL
  Filled 2014-09-16 (×2): qty 0.5

## 2014-09-16 MED ORDER — DILTIAZEM HCL 25 MG/5ML IV SOLN
10.0000 mg | Freq: Once | INTRAVENOUS | Status: AC
  Administered 2014-09-16: 10 mg via INTRAVENOUS

## 2014-09-16 MED ORDER — CETYLPYRIDINIUM CHLORIDE 0.05 % MT LIQD
7.0000 mL | Freq: Two times a day (BID) | OROMUCOSAL | Status: DC
  Administered 2014-09-17 – 2014-09-19 (×5): 7 mL via OROMUCOSAL

## 2014-09-16 MED ORDER — DILTIAZEM HCL 100 MG IV SOLR
5.0000 mg/h | INTRAVENOUS | Status: DC
  Administered 2014-09-16 – 2014-09-18 (×4): 5 mg/h via INTRAVENOUS
  Filled 2014-09-16 (×4): qty 100

## 2014-09-16 NOTE — Progress Notes (Signed)
Patient ID: Eileen [name], female   DOB: 1945-04-24, 69 y.o.   MRN: [id number] [facility] Physicians PROGRESS NOTE  HPI/Subjective: Patient states that she once the mask off. Patient is on 100% BiPAP. Patient is lethargic. Does not complain of any shortness of breath or cough.  Objective: Filed Vitals:   09/16/14 0700  BP: 122/53  Pulse: 90  Temp:   Resp: 21    Intake/Output Summary (Last 24 hours) at 09/16/14 1139 Last data filed at 09/16/14 0500  Gross per 24 hour  Intake    900 ml  Output      0 ml  Net    900 ml   Filed Weights   09/14/14 0047 09/14/14 0351  Weight: 169.1 kg (372 lb 12.8 oz) 168.647 kg (371 lb 12.8 oz)    ROS: Review of Systems  Constitutional: Negative for fever and chills.  Eyes: Negative for blurred vision.  Respiratory: Negative for cough and shortness of breath.   Cardiovascular: Negative for chest pain.  Gastrointestinal: Negative for nausea, vomiting, abdominal pain, diarrhea and constipation.  Genitourinary: Negative for dysuria.  Musculoskeletal: Negative for joint pain.  Neurological: Negative for dizziness and headaches.   Exam: Physical Exam  HENT:  Nose: No mucosal edema.  Mouth/Throat: No oropharyngeal exudate or posterior oropharyngeal edema.  Eyes: Conjunctivae, EOM and lids are normal. Pupils are equal, round, and reactive to light.  Neck: No JVD present. Carotid bruit is not present. No edema present. No thyroid mass and no thyromegaly present.  Cardiovascular: S1 normal and S2 normal.  Exam reveals no gallop.   Murmur heard.  Systolic murmur is present with a grade of 4/6  Pulses:      Dorsalis pedis pulses are 2+ on the right side, and 2+ on the left side.  Respiratory: No respiratory distress. She has decreased breath sounds in the right middle field, the right lower field, the left middle field and the left lower field. She has no wheezes. She has no rhonchi. She has no rales.  GI: Soft. Bowel sounds are normal.  There is no tenderness.  Musculoskeletal:       Right ankle: She exhibits swelling.       Left ankle: She exhibits swelling.  Lymphadenopathy:    She has no cervical adenopathy.  Neurological: She is alert. No cranial nerve deficit.  Skin: Skin is warm. No rash noted. Nails show no clubbing.  Psychiatric: She has a normal mood and affect.    Data Reviewed: Basic Metabolic Panel:  Recent Labs Lab 09/14/14 0101  NA 143  K 4.0  CL 96*  CO2 41*  GLUCOSE 135*  BUN 28*  CREATININE 0.69  CALCIUM 8.6*    CBC:  Recent Labs Lab 09/14/14 0101 09/14/14 0510  WBC 7.5 7.2  NEUTROABS 5.8  --   HGB 9.6* 10.2*  HCT 30.6* 31.7*  MCV 93.8 93.8  PLT 108* 108*     Scheduled Meds: . apixaban  5 mg Oral BID  . azithromycin  500 mg Oral Daily  . docusate sodium  200 mg Oral BID  . furosemide  80 mg Intravenous Once  . haloperidol  1 mg Oral BID  . ipratropium-albuterol  3 mL Nebulization Q6H  . methylPREDNISolone (SOLU-MEDROL) injection  60 mg Intravenous Q6H  . sertraline  100 mg Oral Daily  . tiotropium  18 mcg Inhalation Daily  . traZODone  100 mg Oral QHS   Continuous Infusions:    Assessment/Plan:  1. Acute on  chronic respiratory failure with hypoxia. COPD exacerbation. the patient has increasing oxygen requirements. Patient is now on 100% BiPAP. I will get a chest x-ray. Stop IV fluids and give a dose of 80 mg IV Lasix. Continue high-dose Solu-Medrol and Zithromax and nebulizer treatments. Patient is on full dose anticoagulation making pulmonary embolism less likely. Patient is a DO NOT RESUSCITATE. High risk for cardiopulmonary arrest. 2. History of blood clots- on Eliquis. 3. Morbid obesity and likely sleep apnea-patient currently on BiPAP. 4. Depression- on Zoloft  Code Status:     Code Status Orders        Start     Ordered   09/14/14 1323  Do not attempt resuscitation (DNR)   Continuous    Question Answer Comment  In the event of cardiac or respiratory  ARREST Do not call a "code blue"   In the event of cardiac or respiratory ARREST Do not perform Intubation, CPR, defibrillation or ACLS   In the event of cardiac or respiratory ARREST Use medication by any route, position, wound care, and other measures to relive pain and suffering. May use oxygen, suction and manual treatment of airway obstruction as needed for comfort.      09/14/14 1322    Advance Directive Documentation        Most Recent Value   Type of Advance Directive  Healthcare Power of Attorney, Living will   Pre-existing out of facility DNR order (yellow form or pink MOST form)     "MOST" Form in Place?       Family Communication: Husband yesterday. Disposition Plan: To be determined  Time spent: 20 minutes  Alford HighlandWIETING, Charrise Lardner  [facility]

## 2014-09-16 NOTE — Progress Notes (Addendum)
Pt in bed remained alert and calm this shift, pt has taken meds well. No hallucinations or pulling at lines. Pt had family visits this shift which were calm and they were  Communicating and supportive to patient. Pt was on HI flo [place] at 85% at start of shift. During shift pt sats started to drop to 40's and resp. Was called, pt remained lucid during but had a change in color nail beds and lips were becoming cynotic. Pt placed on bipap at 100% 14/6. And tolerated well mthe rest of shifty with sats 92%. Ophelia Charter.  Sitter remains at bedside

## 2014-09-16 NOTE — Progress Notes (Signed)
Patient rhythm changed to atrial fib, spoke with Dr. Hilton SinclairWeiting, MD order for Cardizem drip, initiate at 5mg  and titrate for HR 110. Order entered.

## 2014-09-16 NOTE — Progress Notes (Signed)
Patient with 100% Bipap, arouses, but mostly sleeping. When bipap removed, SATs immediately to 60-70%. Could not take off long enough to administer PO medications. Spoke to Dr. Forreston BingWieiting who went into patient room, and spoke with patient. Order for CXR, stop IVF, and Lasix. Continue to monitor.

## 2014-09-17 DIAGNOSIS — J9621 Acute and chronic respiratory failure with hypoxia: Secondary | ICD-10-CM

## 2014-09-17 DIAGNOSIS — R410 Disorientation, unspecified: Secondary | ICD-10-CM

## 2014-09-17 DIAGNOSIS — J441 Chronic obstructive pulmonary disease with (acute) exacerbation: Secondary | ICD-10-CM

## 2014-09-17 DIAGNOSIS — J449 Chronic obstructive pulmonary disease, unspecified: Secondary | ICD-10-CM

## 2014-09-17 DIAGNOSIS — R0689 Other abnormalities of breathing: Secondary | ICD-10-CM

## 2014-09-17 LAB — BASIC METABOLIC PANEL
Anion gap: 8 (ref 5–15)
BUN: 27 mg/dL — AB (ref 6–20)
CO2: 43 mmol/L — ABNORMAL HIGH (ref 22–32)
Calcium: 8.3 mg/dL — ABNORMAL LOW (ref 8.9–10.3)
Chloride: 94 mmol/L — ABNORMAL LOW (ref 101–111)
Creatinine, Ser: 0.84 mg/dL (ref 0.44–1.00)
GFR calc Af Amer: >60 mL/min (ref >60)
GFR calc non Af Amer: >60 mL/min (ref >60)
Glucose, Bld: 150 mg/dL — ABNORMAL HIGH (ref 65–99)
Potassium: 4.6 mmol/L (ref 3.5–5.1)
SODIUM: 145 mmol/L (ref 135–145)

## 2014-09-17 LAB — CBC
HCT: 33.7 % — ABNORMAL LOW (ref 35.0–47.0)
Hemoglobin: 10.4 g/dL — ABNORMAL LOW (ref 12.0–16.0)
MCH: 29.4 pg (ref 26.0–34.0)
MCHC: 30.8 g/dL — ABNORMAL LOW (ref 32.0–36.0)
MCV: 95.5 fL (ref 80.0–100.0)
PLATELETS: 104 10*3/uL — AB (ref 150–440)
RBC: 3.53 MIL/uL — ABNORMAL LOW (ref 3.80–5.20)
RDW: 15.5 % — AB (ref 11.5–14.5)
WBC: 5.7 10*3/uL (ref 3.6–11.0)

## 2014-09-17 MED ORDER — METHYLPREDNISOLONE SODIUM SUCC 125 MG IJ SOLR: 60.0000 mg | INTRAMUSCULAR | Status: DC

## 2014-09-17 MED ORDER — HALOPERIDOL LACTATE 5 MG/ML IJ SOLN
0.5000 mg | INTRAMUSCULAR | Status: DC | PRN
  Administered 2014-09-18: 0.5 mg via INTRAVENOUS
  Filled 2014-09-17: qty 1

## 2014-09-17 MED ORDER — LORAZEPAM 2 MG/ML IJ SOLN
0.5000 mg | Freq: Four times a day (QID) | INTRAMUSCULAR | Status: DC | PRN
  Administered 2014-09-17 – 2014-09-21 (×3): 0.5 mg via INTRAVENOUS
  Filled 2014-09-17 (×4): qty 1

## 2014-09-17 MED ORDER — BUDESONIDE 0.5 MG/2ML IN SUSP
0.5000 mg | Freq: Two times a day (BID) | RESPIRATORY_TRACT | Status: DC
  Administered 2014-09-17 – 2014-10-03 (×32): 0.5 mg via RESPIRATORY_TRACT
  Filled 2014-09-17 (×32): qty 2

## 2014-09-17 MED ORDER — DEXTROSE 5 % IV SOLN
500.0000 mg | INTRAVENOUS | Status: DC
  Administered 2014-09-17: 500 mg via INTRAVENOUS
  Filled 2014-09-17 (×3): qty 500

## 2014-09-17 MED ORDER — FUROSEMIDE 10 MG/ML IJ SOLN
80.0000 mg | Freq: Two times a day (BID) | INTRAMUSCULAR | Status: DC
  Administered 2014-09-17 – 2014-09-18 (×3): 80 mg via INTRAVENOUS
  Filled 2014-09-17 (×3): qty 8

## 2014-09-17 MED ORDER — METHYLPREDNISOLONE SODIUM SUCC 125 MG IJ SOLR
60.0000 mg | Freq: Four times a day (QID) | INTRAMUSCULAR | Status: DC
  Administered 2014-09-17 – 2014-09-18 (×4): 60 mg via INTRAVENOUS
  Filled 2014-09-17 (×4): qty 2

## 2014-09-17 MED ORDER — ALBUTEROL SULFATE (2.5 MG/3ML) 0.083% IN NEBU
2.5000 mg | INHALATION_SOLUTION | Freq: Four times a day (QID) | RESPIRATORY_TRACT | Status: DC
  Administered 2014-09-17 – 2014-09-21 (×17): 2.5 mg via RESPIRATORY_TRACT
  Filled 2014-09-17 (×17): qty 3

## 2014-09-17 NOTE — Care Management (Signed)
Important Message  Patient Details  Name: Eileen [name] MRN: [id number] Date of Birth: 1945/04/12   Medicare Important Message Given:  Yes-second notification given    Olegario MessierKathy A Allmond 09/17/2014, 11:26 AM

## 2014-09-17 NOTE — Progress Notes (Signed)
Called Dr. Renae GlossWieting to notify him of pt's spouse at bedside. Also, informed him of contact numbers now updated with correct information. Eileen [name],Eileen Mathieson, RN

## 2014-09-17 NOTE — Progress Notes (Signed)
Patient ID: Eileen MorrowShirley A [name], female   DOB: 1945/04/12, 69 y.o.   MRN: [id number]   ABG shows a high PCO2. I spoke with the respiratory therapist about trying to increase the BiPAP to blow off this PCO2. Patient is a DO NOT RESUSCITATE. I will get a pulmonary consultation.  Dr. Alford Highlandichard Jontae Adebayo

## 2014-09-17 NOTE — Consult Note (Signed)
[facility] Consult   Reason for Consult:  Consult for 69 year old woman currently in the hospital for respiratory failure. Concerns about hallucinations delusions and mood instability Referring Physician:  Bobbye Charleston Patient Identification: Eileen [name] MRN:  [id number] Principal Diagnosis: Acute on chronic respiratory failure with hypoxia Diagnosis:  Delirium Patient Active Problem List   Diagnosis Date Noted  . Acute delirium [R41.0] 09/17/2014  . Acute on chronic respiratory failure with hypoxia [J96.21] 09/14/2014  . COPD exacerbation [J44.1] 09/14/2014  . Pressure ulcer [L89.90] 09/14/2014    Total Time spent with patient: 45 minutes  Subjective:   Eileen [name] is a 69 y.o. female patient admitted with patient was admitted with respiratory failure. In the hospital she has exhibited symptoms of depression but also confusion and hallucinations with paranoid delirium as well. Patient is not currently able to give any history.Marland Kitchen  HPI:  Patient was noted to be paranoid and to be showing evidence of having hallucinations and depression. Dr. Dillard Cannon saw the patient over the weekend and ordered oral Haldol in addition to the current Zoloft. Nursing today tells me that the patient appeared to be oversedated from the Haldol. It was discontinued today. Patient is now in the critical care unit and is on a BiPAP mask and was unable to give me any direct history. Symptoms appeared to be coincident with hospitalization.  Past psychiatric history: History of some degree of depression but has far as I can tell no hospitalization or suicidality. Was taking Zoloft prior to admission.  Medical history: Currently with respiratory failure. Patient has obesity history of a pressure ulcer and COPD.  Social history: Married. Husband appears to be actively involved.  Family history: Unknown  Current medications include steroid-induced as well as medicines for blood pressure and Lasix.  Was getting when necessary Ativan today.  Substance abuse history: Noncontributory HPI Elements:   Quality:  Delirium. Severity:  Moderate to severe disrupting her treatment. Timing:  Appears to be coincident with hospitalization. Duration:  Ongoing. Context:  Severe illness.  Past Medical History:  Past Medical History  Diagnosis Date  . COPD (chronic obstructive pulmonary disease)   . Arthritis     Past Surgical History  Procedure Laterality Date  . Laminectomy    . Back surgery     Family History:  Family History  Problem Relation Age of Onset  . CAD Mother   . Diabetes type II Mother   . Hypertension Father   . CAD Father    Social History:  History  Alcohol Use No     History  Drug Use No    History   Social History  . Marital Status: Married    Spouse Name: N/A  . Number of Children: N/A  . Years of Education: N/A   Social History Main Topics  . Smoking status: Never Smoker   . Smokeless tobacco: Never Used  . Alcohol Use: No  . Drug Use: No  . Sexual Activity: Not on file   Other Topics Concern  . None   Social History Narrative  . None   Additional Social History:                          Allergies:   Allergies  Allergen Reactions  . Other Other (See Comments)    darvocet--pt reports blistering with this medication  . Penicillins Nausea And Vomiting  . Darvon [Propoxyphene] Other (See Comments)    Blisters  Labs:  Results for orders placed or performed during the hospital encounter of 09/14/14 (from the past 48 hour(s))  CBC     Status: Abnormal   Collection Time: 09/17/14  4:44 AM  Result Value Ref Range   WBC 5.7 3.6 - 11.0 K/uL   RBC 3.53 (L) 3.80 - 5.20 MIL/uL   Hemoglobin 10.4 (L) 12.0 - 16.0 g/dL   HCT 33.7 (L) 35.0 - 47.0 %   MCV 95.5 80.0 - 100.0 fL   MCH 29.4 26.0 - 34.0 pg   MCHC 30.8 (L) 32.0 - 36.0 g/dL   RDW 15.5 (H) 11.5 - 14.5 %   Platelets 104 (L) 150 - 440 K/uL  Basic metabolic panel     Status:  Abnormal   Collection Time: 09/17/14  4:44 AM  Result Value Ref Range   Sodium 145 135 - 145 mmol/L   Potassium 4.6 3.5 - 5.1 mmol/L   Chloride 94 (L) 101 - 111 mmol/L   CO2 43 (H) 22 - 32 mmol/L   Glucose, Bld 150 (H) 65 - 99 mg/dL   BUN 27 (H) 6 - 20 mg/dL   Creatinine, Ser 0.84 0.44 - 1.00 mg/dL   Calcium 8.3 (L) 8.9 - 10.3 mg/dL   GFR calc non Af Amer >60 >60 mL/min   GFR calc Af Amer >60 >60 mL/min    Comment: (NOTE) The eGFR has been calculated using the CKD EPI equation. This calculation has not been validated in all clinical situations. eGFR's persistently <60 mL/min signify possible Chronic Kidney Disease.    Anion gap 8 5 - 15  Blood gas, arterial     Status: Abnormal   Collection Time: 09/17/14 12:20 PM  Result Value Ref Range   FIO2 0.99 %   Delivery systems BILEVEL POSITIVE AIRWAY PRESSURE    Inspiratory PAP 14    Expiratory PAP 6    pH, Arterial 7.32 (L) 7.350 - 7.450   pCO2 arterial 115 (HH) 32.0 - 48.0 mmHg    Comment: CRITICAL RESULT CALLED TO, READ BACK BY AND VERIFIED WITH: DR. Earleen Newport [phone]    pO2, Arterial 81 (L) 83.0 - 108.0 mmHg   Bicarbonate 59.3 (H) 21.0 - 28.0 mEq/L   Acid-Base Excess 26.2 (H) 0.0 - 3.0 mmol/L   O2 Saturation 95.0 %   Patient temperature 37.0    Collection site RIGHT RADIAL    Sample type ARTERIAL DRAW    Allens test (pass/fail) POSITIVE (A) PASS    Vitals: Blood pressure 112/72, pulse 75, temperature 99.5 F (37.5 C), temperature source Axillary, resp. rate 15, height 4' 11.75" (1.518 m), weight 202 kg (445 lb 5.3 oz), SpO2 90 %.  Risk to Self: Is patient at risk for suicide?: No Risk to Others:   Prior Inpatient Therapy:   Prior Outpatient Therapy:    Current Facility-Administered Medications  Medication Dose Route Frequency Provider Last Rate Last Dose  . acetaminophen (TYLENOL) tablet 650 mg  650 mg Oral Q6H PRN Juluis Mire, MD       Or  . acetaminophen (TYLENOL) suppository 650 mg  650 mg Rectal Q6H PRN  Juluis Mire, MD      . albuterol (PROVENTIL) (2.5 MG/3ML) 0.083% nebulizer solution 2.5 mg  2.5 mg Nebulization Q6H Loletha Grayer, MD   2.5 mg at 09/17/14 1354  . antiseptic oral rinse (CPC / CETYLPYRIDINIUM CHLORIDE 0.05%) solution 7 mL  7 mL Mouth Rinse q12n4p Loletha Grayer, MD   7 mL at 09/17/14 1717  .  apixaban (ELIQUIS) tablet 5 mg  5 mg Oral BID Henreitta Leber, MD   5 mg at 09/17/14 1007  . azithromycin (ZITHROMAX) 500 mg in dextrose 5 % 250 mL IVPB  500 mg Intravenous Q24H Loletha Grayer, MD   500 mg at 09/17/14 1322  . budesonide (PULMICORT) nebulizer solution 0.5 mg  0.5 mg Nebulization BID Vishal Mungal, MD   0.5 mg at 09/17/14 1039  . chlorhexidine (PERIDEX) 0.12 % solution 15 mL  15 mL Mouth Rinse BID Loletha Grayer, MD   15 mL at 09/17/14 0805  . diltiazem (CARDIZEM) 100 mg in dextrose 5 % 100 mL (1 mg/mL) infusion  5-15 mg/hr Intravenous Titrated Loletha Grayer, MD 5 mL/hr at 09/17/14 0357 5 mg/hr at 09/17/14 0357  . docusate sodium (COLACE) capsule 200 mg  200 mg Oral BID Henreitta Leber, MD   200 mg at 09/17/14 1007  . furosemide (LASIX) injection 80 mg  80 mg Intravenous BID Loletha Grayer, MD   80 mg at 09/17/14 1724  . haloperidol lactate (HALDOL) injection 0.5 mg  0.5 mg Intravenous Q4H PRN Gonzella Lex, MD      . LORazepam (ATIVAN) injection 0.5 mg  0.5 mg Intravenous Q6H PRN Loletha Grayer, MD   0.5 mg at 09/17/14 1302  . methylPREDNISolone sodium succinate (SOLU-MEDROL) 125 mg/2 mL injection 60 mg  60 mg Intravenous Q6H Loletha Grayer, MD   60 mg at 09/17/14 1716  . morphine CONCENTRATE 10 MG/0.5ML oral solution 10 mg  10 mg Oral Q2H PRN Loletha Grayer, MD      . ondansetron [facility]) tablet 4 mg  4 mg Oral Q6H PRN Juluis Mire, MD       Or  . ondansetron [facility]) injection 4 mg  4 mg Intravenous Q6H PRN Juluis Mire, MD   4 mg at 09/15/14 2227  . senna-docusate (Senokot-S) tablet 1 tablet  1 tablet Oral QHS PRN Juluis Mire, MD      . sertraline  (ZOLOFT) tablet 100 mg  100 mg Oral Daily Henreitta Leber, MD   100 mg at 09/17/14 1007  . tiotropium (SPIRIVA) inhalation capsule 18 mcg  18 mcg Inhalation Daily Juluis Mire, MD   18 mcg at 09/17/14 0803  . traZODone (DESYREL) tablet 100 mg  100 mg Oral QHS Henreitta Leber, MD   100 mg at 09/15/14 2137    Musculoskeletal: Strength & Muscle Tone: decreased Gait & Station: unable to stand Patient leans: N/A  Psychiatric Specialty Exam: Physical Exam  Constitutional: She appears well-developed and well-nourished. She appears distressed. She is sedated and restrained. Face mask in place.  HENT:  Head: Normocephalic and atraumatic.  Eyes: Conjunctivae are normal. Pupils are equal, round, and reactive to light.  Neck: Normal range of motion.  Cardiovascular: Normal heart sounds.   Respiratory: Effort normal.  GI: Soft.  Musculoskeletal: Normal range of motion.  Neurological: She is alert.  Skin: Skin is warm and dry.  Psychiatric: Her affect is blunt. She is agitated. She is noncommunicative.    Review of Systems  Unable to perform ROS: acuity of condition    Blood pressure 112/72, pulse 75, temperature 99.5 F (37.5 C), temperature source Axillary, resp. rate 15, height 4' 11.75" (1.518 m), weight 202 kg (445 lb 5.3 oz), SpO2 90 %.Body mass index is 87.66 kg/(m^2).  General Appearance: Disheveled  Eye Contact::  Poor  Speech:  Slurred  Volume:  Decreased  Mood:  Anxious  Affect:  Restricted  Thought Process:  Disorganized  Orientation:  Negative  Thought Content:  NA  Suicidal Thoughts:  No  Homicidal Thoughts:  No  Memory:  Negative  Judgement:  Impaired  Insight:  Lacking  Psychomotor Activity:  Restlessness  Concentration:  Poor  Recall:  Poor  Fund of Knowledge:Poor  Language: Poor  Akathisia:  No  Handed:  Right  AIMS (if indicated):     Assets:  Social Support  ADL's:  Impaired  Cognition: Impaired,  Moderate  Sleep:      Medical Decision Making: New  problem, with additional work up planned, Review or order clinical lab tests (1), Discuss test with performing physician (1), Decision to obtain old records (1), Review of Medication Regimen & Side Effects (2) and Review of New Medication or Change in Dosage (2)  Treatment Plan Summary: Daily contact with patient to assess and evaluate symptoms and progress in treatment, Medication management and Plan Case discussed with nursing. Currently the patient is unable to have enough of an interview to give a reasonable history. She indicated not having suicidal ideation and has not behaved in a suicidal manner but continues to appear to be delirious picking at herself. She has required some soft restraints and mittens to prevent dangerous behavior. I recommend that we not restart the oral Haldol but instead try intravenous haloperidol 0.5 mg every 4 hours when necessary for agitation. I will continue the Ativan as ordered as well. No change to another medicine. I suspect this is largely a delirium related to illness and hospitalization and medication. I will continue to follow and try and get a better history once the agent is able to communicate  Plan:  Patient does not meet criteria for psychiatric inpatient admission. Discussed crisis plan, support from social network, calling 911, coming to the Emergency Department, and calling Suicide Hotline. Disposition: Patient  Alethia Berthold 09/17/2014 7:07 PM

## 2014-09-17 NOTE — Progress Notes (Signed)
PHARMACY - CRITICAL CARE PROGRESS NOTE  Pharmacy Consult for Electrolyte Management   Indication: ICU Status   Allergies  Allergen Reactions  . Other Other (See Comments)    darvocet--pt reports blistering with this medication  . Penicillins Nausea And Vomiting  . Darvon [Propoxyphene] Other (See Comments)    Blisters    Patient Measurements: Height: 4' 11.75" (151.8 cm) Weight: (!) 445 lb 5.3 oz (202 kg) IBW/kg (Calculated) : 44.93   Vital Signs: Temp: 98.1 F (36.7 C) (07/11 0735) Temp Source: Axillary (07/11 0735) BP: 122/62 mmHg (07/11 1000) Pulse Rate: 89 (07/11 1000) Intake/Output from previous day: 07/10 0701 - 07/11 0700 In: 104.1 [P.O.:10; I.V.:94.1] Out: -  Intake/Output from this shift: Total I/O In: 10 [I.V.:10] Out: -  Vent settings for last 24 hours: Vent Mode:  [-]  FiO2 (%):  [100 %] 100 %  Labs:  Recent Labs  09/17/14 0444  WBC 5.7  HGB 10.4*  HCT 33.7*  PLT 104*  CREATININE 0.84   Estimated Creatinine Clearance: 106 mL/min (by C-G formula based on Cr of 0.84).  No results for input(s): GLUCAP in the last 72 hours.  Microbiology: Recent Results (from the past 720 hour(s))  MRSA PCR Screening     Status: None   Collection Time: 09/14/14  7:50 PM  Result Value Ref Range Status   MRSA by PCR NEGATIVE NEGATIVE Final    Comment:        The GeneXpert MRSA Assay (FDA approved for NASAL specimens only), is one component of a comprehensive MRSA colonization surveillance program. It is not intended to diagnose MRSA infection nor to guide or monitor treatment for MRSA infections.     Medications:  Scheduled:  . albuterol  2.5 mg Nebulization Q6H  . antiseptic oral rinse  7 mL Mouth Rinse q12n4p  . apixaban  5 mg Oral BID  . azithromycin  500 mg Intravenous Q24H  . budesonide (PULMICORT) nebulizer solution  0.5 mg Nebulization BID  . chlorhexidine  15 mL Mouth Rinse BID  . docusate sodium  200 mg Oral BID  . furosemide  80 mg  Intravenous BID  . methylPREDNISolone (SOLU-MEDROL) injection  60 mg Intravenous Q6H  . sertraline  100 mg Oral Daily  . tiotropium  18 mcg Inhalation Daily  . traZODone  100 mg Oral QHS   Infusions:  . diltiazem (CARDIZEM) infusion 5 mg/hr (09/17/14 0357)    Assessment: 69 yo female ICU patient on BIPAP receiving Lasix 80mg  IV BID.   Plan:   Electrolytes: Electrolytes are WNL. Will obtain follow-up electrolytes with am labs.    Pharmacy will continue to monitor and adjust per consult.    Eileen [name] L 09/17/2014,1:06 PM

## 2014-09-17 NOTE — Progress Notes (Signed)
Called Dr. Renae GlossWieting in regards to pt's scheduled Haloperidol. Per Dr. Renae GlossWieting, ordered to discontinue medication due to pt's neuro status. Stephana Morell, Charity fundraiserN  .

## 2014-09-17 NOTE — Progress Notes (Signed)
Patient ID: Eileen MorrowShirley A [name], female   DOB: May 11, 1945, 69 y.o.   MRN: [id number] [facility]  HPI/Subjective: Patient awakened with sternal rub. I asked her if she was short of breath and she said no. Went back to sleep very easily. Stimulated again and answered only a few yes or no questions and that was it.  Objective: Filed Vitals:   09/17/14 0800  BP: 118/49  Pulse: 77  Temp:   Resp: 16    Intake/Output Summary (Last 24 hours) at 09/17/14 0840 Last data filed at 09/17/14 0600  Gross per 24 hour  Intake 104.08 ml  Output      0 ml  Net 104.08 ml   Filed Weights   09/14/14 0047 09/14/14 0351 09/17/14 0437  Weight: 169.1 kg (372 lb 12.8 oz) 168.647 kg (371 lb 12.8 oz) 202 kg (445 lb 5.3 oz)    ROS: Review of Systems  Unable to perform ROS  secondary to lethargy. Exam: Physical Exam  HENT:  Nose: No mucosal edema.  Mouth/Throat: No oropharyngeal exudate or posterior oropharyngeal edema.  Eyes: Conjunctivae, EOM and lids are normal. Pupils are equal, round, and reactive to light.  Neck: No JVD present. Carotid bruit is not present. No edema present. No thyroid mass and no thyromegaly present.  Cardiovascular: S1 normal and S2 normal.  Exam reveals no gallop.   Murmur heard.  Systolic murmur is present with a grade of 4/6  Pulses:      Dorsalis pedis pulses are 2+ on the right side, and 2+ on the left side.  Respiratory: No respiratory distress. She has decreased breath sounds in the right middle field, the right lower field, the left middle field and the left lower field. She has no wheezes. She has no rhonchi. She has no rales.  GI: Soft. Bowel sounds are normal. There is no tenderness.  Musculoskeletal:       Right ankle: She exhibits swelling.       Left ankle: She exhibits swelling.  Lymphadenopathy:    She has no cervical adenopathy.  Neurological: She is alert. No cranial nerve deficit.  Skin: Skin is warm. No rash noted. Nails show  no clubbing.  Psychiatric: She has a normal mood and affect.    Data Reviewed: Basic Metabolic Panel:  Recent Labs Lab 09/14/14 0101 09/17/14 0444  NA 143 145  K 4.0 4.6  CL 96* 94*  CO2 41* 43*  GLUCOSE 135* 150*  BUN 28* 27*  CREATININE 0.69 0.84  CALCIUM 8.6* 8.3*    CBC:  Recent Labs Lab 09/14/14 0101 09/14/14 0510 09/17/14 0444  WBC 7.5 7.2 5.7  NEUTROABS 5.8  --   --   HGB 9.6* 10.2* 10.4*  HCT 30.6* 31.7* 33.7*  MCV 93.8 93.8 95.5  PLT 108* 108* 104*     Scheduled Meds: . antiseptic oral rinse  7 mL Mouth Rinse q12n4p  . apixaban  5 mg Oral BID  . azithromycin  500 mg Oral Daily  . chlorhexidine  15 mL Mouth Rinse BID  . docusate sodium  200 mg Oral BID  . furosemide  80 mg Intravenous BID  . haloperidol  1 mg Oral BID  . ipratropium-albuterol  3 mL Nebulization Q6H  . methylPREDNISolone (SOLU-MEDROL) injection  60 mg Intravenous Q6H  . sertraline  100 mg Oral Daily  . tiotropium  18 mcg Inhalation Daily  . traZODone  100 mg Oral QHS   Continuous Infusions: . diltiazem (CARDIZEM) infusion  5 mg/hr (09/17/14 0357)    Assessment/Plan:  1. Acute on chronic respiratory failure with hypoxia. COPD exacerbation. the patient has increasing oxygen requirements. Patient is now on 100% BiPAP. I will get a chest x-ray. Continue 80 mg IV Lasix twice a day. Continue high-dose Solu-Medrol and Zithromax and nebulizer treatments.  Patient is a DO NOT RESUSCITATE. High risk for cardiopulmonary arrest. I was unable to contact the husband this a.m. for goals of care. I will have the nursing staff contact me once husband comes in. 2. History of blood clots- on Eliquis. 3. Morbid obesity and likely sleep apnea-patient currently on BiPAP. 4. Depression- on Zoloft  Code Status:     Code Status Orders        Start     Ordered   09/14/14 1323  Do not attempt resuscitation (DNR)   Continuous    Question Answer Comment  In the event of cardiac or respiratory ARREST  Do not call a "code blue"   In the event of cardiac or respiratory ARREST Do not perform Intubation, CPR, defibrillation or ACLS   In the event of cardiac or respiratory ARREST Use medication by any route, position, wound care, and other measures to relive pain and suffering. May use oxygen, suction and manual treatment of airway obstruction as needed for comfort.      09/14/14 1322    Advance Directive Documentation        Most Recent Value   Type of Advance Directive  Healthcare Power of Attorney, Living will   Pre-existing out of facility DNR order (yellow form or pink MOST form)     "MOST" Form in Place?       Family Communication: Husband yesterday. Disposition Plan: To be determined  Time spent: 20 minutes.  Alford Highland  [facility]

## 2014-09-17 NOTE — Progress Notes (Signed)
Called Dr. Renae GlossWieting with pt's increased irritability. Pt stating she wants mask off for Bipap and states she can breathe better without the mask on. Informed pt the need to have masked on. Pt told that the bipap is what is helping her to breathe. Informed pt that without the oxygen in the bipap then she will not be able to breathe. Pt continuously pulling at mask and mittens and saying she wants them all off.  Dr. Renae GlossWieting ordered ativan 0.5mg  Q6 hr PRN for agiation.  Derald MacleodKillingsworth,Liseth Wann, RN

## 2014-09-17 NOTE — Progress Notes (Signed)
Patient ID: Eileen MorrowShirley A [name], female   DOB: [birth year], 69 y.o.   MRN: [id number]  Nursing staff updated correct phone number for her husband in the chart.  Husband came to the hospital and I came back to talk to him to give an update on his wife's condition. The patient is critically ill and high risk for cardiopulmonary arrest. She is on 100% BiPAP with altered mental status. She is a DO NOT RESUSCITATE. He would like to have us do whatever we can.  I will get an ABG. She is on high-dose Solu-Medrol. Switch Zithromax to IV. She is on high-dose Lasix 80 mg IV twice a day. Continue to monitor on a daily basis.  Overall prognosis poor. Husband is just hoping that she improves.

## 2014-09-17 NOTE — Care Management Note (Addendum)
Case Management Note  Patient Details  Name: Modena MorrowShirley A Hosman MRN: [id number] Date of Birth: [birth year]  Subjective/Objective: Acute on chronic respiratory failure secondary to COPD exacerbation. On 100% BIPAP support. IV antibiotics, IV lasix, IV steroids, Nebs. Pulmonary consult pending. Noted CSW note. Following.                    Action/Plan:   Expected Discharge Date:                  Expected Discharge Plan:     In-House Referral:     Discharge planning Services     Post Acute Care Choice:    Choice offered to:     DME Arranged:    DME Agency:     HH Arranged:    HH Agency:     Status of Service:     Medicare Important Message Given:  Yes-second notification given Date Medicare IM Given:    Medicare IM give by:    Date Additional Medicare IM Given:    Additional Medicare Important Message give by:     If discussed at Long Length of Stay Meetings, dates discussed:    Additional Comments:  Marily MemosLisa M Halen Mossbarger, RN 09/17/2014, 3:38 PM

## 2014-09-17 NOTE — Consult Note (Signed)
PULMONARY / CRITICAL CARE MEDICINE   Name: Eileen [name] MRN: [id number] DOB: [birth year]    ADMISSION DATE:  09/14/2014 CONSULTATION DATE:  09/17/14  REFERRING MD :  Dr. Hilton Sinclair   CHIEF COMPLAINT:     Short of breath   HISTORY OF PRESENT ILLNESS  Patient noted to have BiPAP on, is moderately confused, but alert unable to obtain a accurate history and physical. History and physical per chart review   69 y.o. female with a known history of COPD on home oxygen 4 L/m, degenerative joint disease was brought into the emergency room with EMS with the complaints of ongoing shortness of breath with wheezing and hypoxia with O2 saturations in the upper 70s to 80s on 09/14/2014. Per chart review she has been having progressively worsening shortness of breath for the past few days which further worsened in the past 2 days. Also gives history of ongoing cough with expectoration of brown sputum for the past 2 days. Denies any fever or chills. No chest pain. No nausea, vomiting, diarrhea, abdominal pain. Patient was placed on nonrebreather mask with 15 L of oxygen, was given DuoNeb's. EMS and brought to the emergency room for further evaluation. In the emergency room patient received further doses of DuoNeb's, was given IV Solu-Medrol and Zithromax, continued on O2 supplementation through nonrebreather mask. Since 09/14/2014 she's been having continued use of the BiPAP mask with 100% FiO2, today she had an ABG drawn that showed a PCO2 greater than 100 even though she is alert and talkative, but is moderately confused.    PAST MEDICAL HISTORY    :  Past Medical History  Diagnosis Date  . COPD (chronic obstructive pulmonary disease)   . Arthritis    Past Surgical History  Procedure Laterality Date  . Laminectomy    . Back surgery     Prior to Admission medications   Medication Sig Start Date End Date Taking? Authorizing Provider  apixaban (ELIQUIS) 5 MG TABS tablet Take 5 mg by mouth 2 (two)  times daily.   Yes Historical Provider, MD  docusate sodium (COLACE) 100 MG capsule Take 200 mg by mouth 2 (two) times daily.   Yes Historical Provider, MD  furosemide (LASIX) 40 MG tablet Take 40 mg by mouth daily.   Yes Historical Provider, MD  LORazepam (ATIVAN) 1 MG tablet Take 1 mg by mouth every 8 (eight) hours as needed for anxiety.   Yes Historical Provider, MD  sertraline (ZOLOFT) 50 MG tablet Take 100 mg by mouth daily.   Yes Historical Provider, MD  traZODone (DESYREL) 100 MG tablet Take 100 mg by mouth at bedtime.   Yes Historical Provider, MD   Allergies  Allergen Reactions  . Other Other (See Comments)    darvocet--pt reports blistering with this medication  . Penicillins Nausea And Vomiting  . Darvon [Propoxyphene] Other (See Comments)    Blisters     FAMILY HISTORY   Family History  Problem Relation Age of Onset  . CAD Mother   . Diabetes type II Mother   . Hypertension Father   . CAD Father       SOCIAL HISTORY    reports that she has never smoked. She has never used smokeless tobacco. She reports that she does not drink alcohol or use illicit drugs.  ROS unable to obtain since patient is on BiPAP    VITAL SIGNS    Temp:  [96.8 F (36 C)-98.1 F (36.7 C)] 98.1 F (36.7 C) (07/11 0735)  Pulse Rate:  [26-150] 89 (07/11 1000) Resp:  [14-38] 21 (07/11 1000) BP: (76-187)/(42-176) 122/62 mmHg (07/11 1000) SpO2:  [91 %-99 %] 91 % (07/11 1000) FiO2 (%):  [100 %] 100 % (07/11 1000) Weight:  [445 lb 5.3 oz (202 kg)] 445 lb 5.3 oz (202 kg) (07/11 0437) HEMODYNAMICS:   VENTILATOR SETTINGS: Vent Mode:  [-]  FiO2 (%):  [100 %] 100 % INTAKE / OUTPUT:  Intake/Output Summary (Last 24 hours) at 09/17/14 1330 Last data filed at 09/17/14 0800  Gross per 24 hour  Intake 114.08 ml  Output      0 ml  Net 114.08 ml       PHYSICAL EXAM   Physical Exam Physical Exam  HENT:  Nose: No mucosal edema.  Mouth/Throat: No oropharyngeal exudate or posterior  oropharyngeal edema.  Eyes: Conjunctivae, EOM and lids are normal. Pupils are equal, round, and reactive to light.  Neck: No JVD present. Carotid bruit is not present. No edema present. No thyroid mass and no thyromegaly present.  Cardiovascular: S1 normal and S2 normal. Exam reveals no gallop.  Murmur heard. Systolic murmur is present with a grade of 4/6  Pulses:  Dorsalis pedis pulses are 2+ on the right side, and 2+ on the left side.  Respiratory: Currently on BiPAP, decreased air movement at the bilateral bases, no significant wheezing appreciated, no rales, no crackles GI: Soft. Bowel sounds are normal. There is no tenderness.  Musculoskeletal:   Right ankle: She exhibits swelling.   Left ankle: She exhibits swelling.  Neuro: Alert, somnolent at times but easily awakened, moderate confusion noted    LABS   LABS:  CBC  Recent Labs Lab 09/14/14 0101 09/14/14 0510 09/17/14 0444  WBC 7.5 7.2 5.7  HGB 9.6* 10.2* 10.4*  HCT 30.6* 31.7* 33.7*  PLT 108* 108* 104*   Coag's No results for input(s): APTT, INR in the last 168 hours. BMET  Recent Labs Lab 09/14/14 0101 09/17/14 0444  NA 143 145  K 4.0 4.6  CL 96* 94*  CO2 41* 43*  BUN 28* 27*  CREATININE 0.69 0.84  GLUCOSE 135* 150*   Electrolytes  Recent Labs Lab 09/14/14 0101 09/17/14 0444  CALCIUM 8.6* 8.3*   Sepsis Markers No results for input(s): LATICACIDVEN, PROCALCITON, O2SATVEN in the last 168 hours. ABG  Recent Labs Lab 09/14/14 0828 09/17/14 1220  PHART 7.28* 7.32*  PCO2ART 98* 115*  PO2ART 115* 81*   Liver Enzymes No results for input(s): AST, ALT, ALKPHOS, BILITOT, ALBUMIN in the last 168 hours. Cardiac Enzymes  Recent Labs Lab 09/14/14 0101  TROPONINI <0.03   Glucose  Recent Labs Lab 09/14/14 1037  GLUCAP 187*     Recent Results (from the past 240 hour(s))  MRSA PCR Screening     Status: None   Collection Time: 09/14/14  7:50 PM  Result Value Ref Range  Status   MRSA by PCR NEGATIVE NEGATIVE Final    Comment:        The GeneXpert MRSA Assay (FDA approved for NASAL specimens only), is one component of a comprehensive MRSA colonization surveillance program. It is not intended to diagnose MRSA infection nor to guide or monitor treatment for MRSA infections.      Current facility-administered medications:  .  acetaminophen (TYLENOL) tablet 650 mg, 650 mg, Oral, Q6H PRN **OR** acetaminophen (TYLENOL) suppository 650 mg, 650 mg, Rectal, Q6H PRN, Crissie Figures, MD .  albuterol (PROVENTIL) (2.5 MG/3ML) 0.083% nebulizer solution 2.5 mg, 2.5 mg,  Nebulization, Q6H, Alford Highland, MD, 2.5 mg at 09/17/14 1039 .  antiseptic oral rinse (CPC / CETYLPYRIDINIUM CHLORIDE 0.05%) solution 7 mL, 7 mL, Mouth Rinse, q12n4p, Alford Highland, MD, 7 mL at 09/17/14 1302 .  apixaban (ELIQUIS) tablet 5 mg, 5 mg, Oral, BID, Houston Siren, MD, 5 mg at 09/17/14 1007 .  azithromycin (ZITHROMAX) 500 mg in dextrose 5 % 250 mL IVPB, 500 mg, Intravenous, Q24H, Alford Highland, MD, 500 mg at 09/17/14 1322 .  budesonide (PULMICORT) nebulizer solution 0.5 mg, 0.5 mg, Nebulization, BID, Tobenna Needs, MD, 0.5 mg at 09/17/14 1039 .  chlorhexidine (PERIDEX) 0.12 % solution 15 mL, 15 mL, Mouth Rinse, BID, Alford Highland, MD, 15 mL at 09/17/14 0805 .  diltiazem (CARDIZEM) 100 mg in dextrose 5 % 100 mL (1 mg/mL) infusion, 5-15 mg/hr, Intravenous, Titrated, Alford Highland, MD, Last Rate: 5 mL/hr at 09/17/14 0357, 5 mg/hr at 09/17/14 0357 .  docusate sodium (COLACE) capsule 200 mg, 200 mg, Oral, BID, Houston Siren, MD, 200 mg at 09/17/14 1007 .  furosemide (LASIX) injection 80 mg, 80 mg, Intravenous, BID, Alford Highland, MD, 80 mg at 09/17/14 0755 .  LORazepam (ATIVAN) injection 0.5 mg, 0.5 mg, Intravenous, Q6H PRN, Alford Highland, MD, 0.5 mg at 09/17/14 1302 .  methylPREDNISolone sodium succinate (SOLU-MEDROL) 125 mg/2 mL injection 60 mg, 60 mg, Intravenous, Q6H, Alford Highland, MD, 60 mg at 09/17/14 1301 .  morphine CONCENTRATE 10 MG/0.5ML oral solution 10 mg, 10 mg, Oral, Q2H PRN, Alford Highland, MD .  ondansetron (ZOFRAN) tablet 4 mg, 4 mg, Oral, Q6H PRN **OR** ondansetron (ZOFRAN) injection 4 mg, 4 mg, Intravenous, Q6H PRN, Crissie Figures, MD, 4 mg at 09/15/14 2227 .  senna-docusate (Senokot-S) tablet 1 tablet, 1 tablet, Oral, QHS PRN, Crissie Figures, MD .  sertraline (ZOLOFT) tablet 100 mg, 100 mg, Oral, Daily, Houston Siren, MD, 100 mg at 09/17/14 1007 .  tiotropium (SPIRIVA) inhalation capsule 18 mcg, 18 mcg, Inhalation, Daily, Crissie Figures, MD, 18 mcg at 09/17/14 0803 .  traZODone (DESYREL) tablet 100 mg, 100 mg, Oral, QHS, Houston Siren, MD, 100 mg at 09/15/14 2137  IMAGING    Chest x-ray 09/16/2014 EXAM: PORTABLE CHEST - 1 VIEW  COMPARISON: 09/14/2014  FINDINGS: Increasing diffuse bilateral airspace disease/edema noted.  There is no evidence of pneumothorax.  Cardiomediastinal silhouette is unchanged.  Bibasilar atelectasis again noted.  There may be bilateral pleural effusions present.  IMPRESSION: Increasing diffuse bilateral airspace opacities/edema.    ASSESSMENT/PLAN   70 year old female past medical history of COPD, OSA, morbid obesity, previous tobacco abuse, seen in consultation for hypercarbic respiratory failure secondary to acute exacerbation of COPD.  Acute on chronic respiratory failure with hypercarbia -Secondary to acute exacerbation of COPD -Continue with BiPAP -Mentating O2 saturations greater than 88% -We'll FiO2 as tolerated -Adjust BiPAP to maintain a leak less than 50, and appropriate ventilation and oxygenation -Patient is a DO NOT RESUSCITATE -ABG reviewed, results a more consistent with a VBG especially with mentation of the patient being alert and a pH of 7.32 pCO2 115   AECOPD  - Trigger unknown at this time, but multifactorial in nature: OHS, OSA, inactivity,  immobility -Previously on 60 mg Solu-Medrol IV every 6, wean down to 60 mg IV daily -Continue broncho-dilators -Incentive spirometry as tolerated -Continue antibiotics -May need to decrease Lasix dose can cause contraction alkalosis and worsening respiratory status  History of blood clots -Continue with Eliquis.  Suspected obstructive sleep apnea versus obesity  hypoventilation syndrome or a combination of both -Continue with BiPAP -ABG as needed  Social -Goals of care to be discussed with family by primary rounding team  CODE STATUS: DO NOT RESUSCITATE   I have personally obtained a history, examined the patient, evaluated laboratory and imaging results, formulated the assessment and plan and placed orders.  The Patient requires high complexity decision making for assessment and support, frequent evaluation and titration of therapies, application of advanced monitoring technologies and extensive interpretation of multiple databases. Critical Care Time devoted to patient care services described in this note is 40 minutes.   Overall, patient is  ill, prognosis is guarded. Patient at high risk for cardiac arrest and death.   Stephanie AcreVishal Wenzel Backlund, MD [facility] Pager [phone]- [phone] (Please enter 7-digits)     09/17/2014, 1:30 PM

## 2014-09-18 ENCOUNTER — Inpatient Hospital Stay: Payer: PPO

## 2014-09-18 ENCOUNTER — Inpatient Hospital Stay
Admit: 2014-09-18 | Discharge: 2014-09-18 | Disposition: A | Discharge status: Home / Self Care | Payer: PPO | Attending: Internal Medicine | Admitting: Internal Medicine

## 2014-09-18 DIAGNOSIS — R410 Disorientation, unspecified: Secondary | ICD-10-CM

## 2014-09-18 DIAGNOSIS — E874 Mixed disorder of acid-base balance: Secondary | ICD-10-CM

## 2014-09-18 DIAGNOSIS — J90 Pleural effusion, not elsewhere classified: Secondary | ICD-10-CM

## 2014-09-18 LAB — BASIC METABOLIC PANEL
Anion gap: 9 (ref 5–15)
BUN: 30 mg/dL — AB (ref 6–20)
CO2: 48 mmol/L — ABNORMAL HIGH (ref 22–32)
CREATININE: 0.82 mg/dL (ref 0.44–1.00)
Calcium: 8.3 mg/dL — ABNORMAL LOW (ref 8.9–10.3)
Chloride: 84 mmol/L — ABNORMAL LOW (ref 101–111)
GFR calc Af Amer: >60 mL/min (ref >60)
GFR calc non Af Amer: >60 mL/min (ref >60)
Glucose, Bld: 175 mg/dL — ABNORMAL HIGH (ref 65–99)
POTASSIUM: 4.4 mmol/L (ref 3.5–5.1)
Sodium: 141 mmol/L (ref 135–145)

## 2014-09-18 LAB — BLOOD GAS, ARTERIAL
ACID-BASE EXCESS: 26.2 mmol/L — AB (ref 0.0–3.0)
ACID-BASE EXCESS: 37.8 mmol/L — AB (ref 0.0–3.0)
Acid-Base Excess: 37.7 mmol/L — ABNORMAL HIGH (ref 0.0–3.0)
Allens test (pass/fail): POSITIVE — AB
Allens test (pass/fail): POSITIVE — AB
Allens test (pass/fail): POSITIVE — AB
BICARBONATE: 69.3 meq/L — AB (ref 21.0–28.0)
Bicarbonate: 59.3 mEq/L — ABNORMAL HIGH (ref 21.0–28.0)
Bicarbonate: 68.6 mEq/L — ABNORMAL HIGH (ref 21.0–28.0)
Delivery systems: POSITIVE
Delivery systems: POSITIVE
Delivery systems: POSITIVE
EXPIRATORY PAP: 10
Expiratory PAP: 6
Expiratory PAP: 10
FIO2: 0.99 %
FIO2: 0.8 %
FIO2: 80 %
INSPIRATORY PAP: 14
INSPIRATORY PAP: 16
Inspiratory PAP: 16
LHR: 12 {breaths}/min
O2 SAT: 94.7 %
O2 Saturation: 95 %
O2 Saturation: 95.5 %
PATIENT TEMPERATURE: 37
PO2 ART: 81 mmHg — AB (ref 83.0–108.0)
PO2 ART: 72 mmHg — AB (ref 83.0–108.0)
Patient temperature: 37
Patient temperature: 37
pCO2 arterial: 115 mmHg (ref 32.0–48.0)
pCO2 arterial: 93 mmHg (ref 32.0–48.0)
pCO2 arterial: 88 mmHg (ref 32.0–48.0)
pH, Arterial: 7.32 — ABNORMAL LOW (ref 7.350–7.450)
pH, Arterial: 7.48 — ABNORMAL HIGH (ref 7.350–7.450)
pH, Arterial: 7.5 — ABNORMAL HIGH (ref 7.350–7.450)
pO2, Arterial: 69 mmHg — ABNORMAL LOW (ref 83.0–108.0)

## 2014-09-18 MED ORDER — FUROSEMIDE 10 MG/ML IJ SOLN: 40.0000 mg | Freq: Two times a day (BID) | INTRAMUSCULAR | Status: DC

## 2014-09-18 MED ORDER — METHYLPREDNISOLONE SODIUM SUCC 125 MG IJ SOLR
60.0000 mg | INTRAMUSCULAR | Status: DC
  Administered 2014-09-19: 60 mg via INTRAVENOUS
  Filled 2014-09-18: qty 2

## 2014-09-18 NOTE — Progress Notes (Signed)
PHARMACY - CRITICAL CARE PROGRESS NOTE  Pharmacy Consult for Electrolyte Management   Indication: ICU Status   Allergies  Allergen Reactions  . Other Other (See Comments)    darvocet--pt reports blistering with this medication  . Penicillins Nausea And Vomiting  . Darvon [Propoxyphene] Other (See Comments)    Blisters    Patient Measurements: Height: 4\' 11"  (149.9 cm) Weight:  (also does not match pt't appearance) IBW/kg (Calculated) : 43.2   Vital Signs: Temp: 98.8 F (37.1 C) (07/12 0800) Temp Source: Axillary (07/12 0800) BP: 123/66 mmHg (07/12 0800) Pulse Rate: 80 (07/12 0800) Intake/Output from previous day: 07/11 0701 - 07/12 0700 In: 370 [I.V.:120; IV Piggyback:250] Out: 3500 [Urine:3500] Intake/Output from this shift:   Vent settings for last 24 hours: Vent Mode:  [-]  FiO2 (%):  [80 %-100 %] 80 %  Labs:  Recent Labs  09/17/14 0444 09/18/14 0602  WBC 5.7  --   HGB 10.4*  --   HCT 33.7*  --   PLT 104*  --   CREATININE 0.84 0.82   Estimated Creatinine Clearance: 107.5 mL/min (by C-G formula based on Cr of 0.82).  No results for input(s): GLUCAP in the last 72 hours.  Microbiology: Recent Results (from the past 720 hour(s))  MRSA PCR Screening     Status: None   Collection Time: 09/14/14  7:50 PM  Result Value Ref Range Status   MRSA by PCR NEGATIVE NEGATIVE Final    Comment:        The GeneXpert MRSA Assay (FDA approved for NASAL specimens only), is one component of a comprehensive MRSA colonization surveillance program. It is not intended to diagnose MRSA infection nor to guide or monitor treatment for MRSA infections.     Medications:  Scheduled:  . albuterol  2.5 mg Nebulization Q6H  . antiseptic oral rinse  7 mL Mouth Rinse q12n4p  . apixaban  5 mg Oral BID  . azithromycin  500 mg Intravenous Q24H  . budesonide (PULMICORT) nebulizer solution  0.5 mg Nebulization BID  . chlorhexidine  15 mL Mouth Rinse BID  . docusate sodium  200  mg Oral BID  . furosemide  80 mg Intravenous BID  . methylPREDNISolone (SOLU-MEDROL) injection  60 mg Intravenous Q6H  . sertraline  100 mg Oral Daily  . tiotropium  18 mcg Inhalation Daily  . traZODone  100 mg Oral QHS   Infusions:  . diltiazem (CARDIZEM) infusion 5 mg/hr (09/18/14 0052)    Assessment: 69 yo female ICU patient on BIPAP receiving Lasix 80mg  IV BID.   Plan:   Electrolytes: Electrolytes are WNL. Will obtain follow-up electrolytes with am labs.    Pharmacy will continue to monitor and adjust per consult.    Simpson,Michael L 09/18/2014,8:48 AM

## 2014-09-18 NOTE — Progress Notes (Signed)
*  PRELIMINARY RESULTS* Echocardiogram 2D Echocardiogram has been performed.  Eileen [name] 09/18/2014, 2:13 PM

## 2014-09-18 NOTE — Progress Notes (Signed)
Patient ID: Eileen [name], female   DOB: 11/08/1945, 69 y.o.   MRN: [id number] [facility] PROGRESS NOTE  HPI/Subjective: Patient more alert today than yesterday. Today she admits to some shortness of breath. Offers no other complaints.  Objective: Filed Vitals:   09/18/14 0800  BP: 123/66  Pulse: 80  Temp: 98.8 F (37.1 C)  Resp: 20    Intake/Output Summary (Last 24 hours) at 09/18/14 1149 Last data filed at 09/18/14 1013  Gross per 24 hour  Intake    360 ml  Output   4450 ml  Net  -4090 ml   Filed Weights   09/17/14 0437  Weight: 202 kg (445 lb 5.3 oz)    ROS: Review of Systems  Constitutional: Negative for fever and chills.  Eyes: Negative for blurred vision.  Respiratory: Positive for shortness of breath. Negative for cough.   Cardiovascular: Negative for chest pain.  Gastrointestinal: Negative for nausea, vomiting, abdominal pain, diarrhea and constipation.  Genitourinary: Negative for dysuria.  Musculoskeletal: Negative for joint pain.  Neurological: Negative for dizziness and headaches.    Exam: Physical Exam  HENT:  Nose: No mucosal edema.  Mouth/Throat: No oropharyngeal exudate or posterior oropharyngeal edema.  Eyes: Conjunctivae, EOM and lids are normal. Pupils are equal, round, and reactive to light.  Neck: No JVD present. Carotid bruit is not present. No edema present. No thyroid mass and no thyromegaly present.  Cardiovascular: S1 normal and S2 normal.  Exam reveals no gallop.   Murmur heard.  Systolic murmur is present with a grade of 4/6  Pulses:      Dorsalis pedis pulses are 2+ on the right side, and 2+ on the left side.  Respiratory: No respiratory distress. She has decreased breath sounds in the right middle field, the right lower field, the left middle field and the left lower field. She has no wheezes. She has no rhonchi. She has no rales.  GI: Soft. Bowel sounds are normal. There is no tenderness.  Musculoskeletal:   Right ankle: She exhibits swelling.       Left ankle: She exhibits swelling.  Lymphadenopathy:    She has no cervical adenopathy.  Neurological: She is alert. No cranial nerve deficit.  Skin: Skin is warm. No rash noted. Nails show no clubbing.  Chronic lower extremity discoloration  Psychiatric: She has a normal mood and affect.    Data Reviewed: Basic Metabolic Panel:  Recent Labs Lab 09/14/14 0101 09/17/14 0444 09/18/14 0602  NA 143 145 141  K 4.0 4.6 4.4  CL 96* 94* 84*  CO2 41* 43* 48*  GLUCOSE 135* 150* 175*  BUN 28* 27* 30*  CREATININE 0.69 0.84 0.82  CALCIUM 8.6* 8.3* 8.3*    CBC:  Recent Labs Lab 09/14/14 0101 09/14/14 0510 09/17/14 0444  WBC 7.5 7.2 5.7  NEUTROABS 5.8  --   --   HGB 9.6* 10.2* 10.4*  HCT 30.6* 31.7* 33.7*  MCV 93.8 93.8 95.5  PLT 108* 108* 104*     Scheduled Meds: . albuterol  2.5 mg Nebulization Q6H  . antiseptic oral rinse  7 mL Mouth Rinse q12n4p  . apixaban  5 mg Oral BID  . azithromycin  500 mg Intravenous Q24H  . budesonide (PULMICORT) nebulizer solution  0.5 mg Nebulization BID  . chlorhexidine  15 mL Mouth Rinse BID  . docusate sodium  200 mg Oral BID  . [START ON 09/19/2014] furosemide  40 mg Intravenous Q12H  . [START ON 09/19/2014] methylPREDNISolone (  SOLU-MEDROL) injection  60 mg Intravenous Q24H  . sertraline  100 mg Oral Daily  . tiotropium  18 mcg Inhalation Daily  . traZODone  100 mg Oral QHS   Continuous Infusions: . diltiazem (CARDIZEM) infusion 5 mg/hr (09/18/14 0052)    Assessment/Plan:  1. Acute on chronic respiratory failure with hypercarbia and hypoxia. COPD exacerbation.  Patient is now on 80% BiPAP.  Appreciate pulmonary consult and adjusting the BiPAP.  Hold Lasix today and then start 40 mg IV twice a day starting tomorrow. Obtain an echocardiogram. Solu-Medrol dose decreased by pulmonary down to once a day 60 mg. I will stop Zithromax after day 5. Patient is a DO NOT RESUSCITATE. High risk for  cardiopulmonary arrest.  2. History of blood clots- on Eliquis. 3. Morbid obesity and likely sleep apnea-patient currently on BiPAP. 4. Depression- on Zoloft 5. Thrombocytopenia seems stable 6. Atrial fibrillation- on low-dose Cardizem drip.  Code Status:     Code Status Orders        Start     Ordered   09/14/14 1323  Do not attempt resuscitation (DNR)   Continuous    Question Answer Comment  In the event of cardiac or respiratory ARREST Do not call a "code blue"   In the event of cardiac or respiratory ARREST Do not perform Intubation, CPR, defibrillation or ACLS   In the event of cardiac or respiratory ARREST Use medication by any route, position, wound care, and other measures to relive pain and suffering. May use oxygen, suction and manual treatment of airway obstruction as needed for comfort.      09/14/14 1322    Advance Directive Documentation        Most Recent Value   Type of Advance Directive  Healthcare Power of Attorney, Living will   Pre-existing out of facility DNR order (yellow form or pink MOST form)     "MOST" Form in Place?       Family Communication: Husband yesterday. Disposition Plan: To be determined, oxygen requirements too high to make a disposition at this point.  Time spent: 20 minutes.  Alford Highland  [facility]

## 2014-09-18 NOTE — Progress Notes (Signed)
Initial Nutrition Assessment     INTERVENTION:  Meals and snacks: Cater to pt preferences. Discussed with RN, Brittney option of puree foods, mechanical soft foods or liquids. RN wanting more liquid foods to be sent at this time and ordered for kitchen staff Nutrition Supplement therapy: Will add magic cup TID for added nutrition  NUTRITION DIAGNOSIS:  Inadequate oral intake related to acute illness as evidenced by meal completion < 25%.    GOAL:  Patient will meet greater than or equal to 90% of their needs    MONITOR:   (Energy intake, Pulmonary profile)  REASON FOR ASSESSMENT:  Consult Assessment of nutrition requirement/status  ASSESSMENT:  Pt admitted with acute COPD exacerbation, on bipap, AMS  PMHx:  Past Medical History  Diagnosis Date  . COPD (chronic obstructive pulmonary disease)   . Arthritis     Diet Order: regular  Current Nutrition: limited intake for the past 4 days during admission secondary to shortness of breath.  Noted on 7/9 ate 50% breakfast and 7/8 100% of one meal no other documentation.  RN Brittney concerned with limited intake and high requirements of pressure from bipap.  Only able to feed pt for short window of time and RN wanting high calorie smooth liquids to provide pt with nutrition  Food/Nutrition-Related History: unable to determine at this time   Medications: senokot, colace, lasix solumedrol  Electrolyte/Renal Profile and Glucose Profile:   Recent Labs Lab 09/14/14 0101 09/17/14 0444 09/18/14 0602  NA 143 145 141  K 4.0 4.6 4.4  CL 96* 94* 84*  CO2 41* 43* 48*  BUN 28* 27* 30*  CREATININE 0.69 0.84 0.82  CALCIUM 8.6* 8.3* 8.3*  GLUCOSE 135* 150* 175*   Protein Profile: No results for input(s): ALBUMIN in the last 168 hours.   Nutrition-Focused Physical Exam Findings:  Unable to complete Nutrition-Focused physical exam at this time.     Weight Change: unsure of any wt change prior to  admission Anthropometrics:    Skin:  Reviewed, no issues  Last BM:  7/11  Height:  Ht Readings from Last 1 Encounters:  09/17/14 4\' 11"  (1.499 m)    Weight:  Wt Readings from Last 1 Encounters:  No data found for Wt    Ideal Body Weight:     Wt Readings from Last 10 Encounters:  No data found for Wt    BMI:  Body mass index is 89.9 kg/(m^2).  Estimated Nutritional Needs:  Kcal:  Using IBW of 44kg (BEE 865 kcals (IF 1.0-1.2, Af 1.2) [id number] kcals/d  Protein:  Using IBW of 44kg (1.0-1.2 g/kg) 44-53 gm/d  Fluid:  Using IBW of 44kg (25-2430ml/kg)1100-1320ml/d  EDUCATION NEEDS:  No education needs identified at this time  MODERATE Care Level Eileen [name], RD, LDN [phone] (pager)

## 2014-09-18 NOTE — Consult Note (Signed)
[facility] Consult   Reason for Consult:  Consult for 69 year old woman currently in the hospital for respiratory failure. Concerns about hallucinations delusions and mood instability Referring Physician:  Bobbye Charleston Patient Identification: Eileen [name] MRN:  [id number] Principal Diagnosis: Acute on chronic respiratory failure with hypoxia Diagnosis:  Delirium Patient Active Problem List   Diagnosis Date Noted  . Acute delirium [R41.0] 09/17/2014  . Acute on chronic respiratory failure with hypoxia [J96.21] 09/14/2014  . COPD exacerbation [J44.1] 09/14/2014  . Pressure ulcer [L89.90] 09/14/2014    Total Time spent with patient: 45 minutes  Subjective:   Eileen [name] is a 69 y.o. female patient admitted with patient was admitted with respiratory failure. In the hospital she has exhibited symptoms of depression but also confusion and hallucinations with paranoid delirium as well. Patient is significantly more communicative today and able to give history.  HPI:  Patient was able to interact and give some history today. She reports that her mood is not feeling overly depressed. She denies suicidal thoughts. Denies being aware of any hallucinations. She is alert and oriented to her situation. Affect and interaction style were appropriate. Medical history: Currently with respiratory failure. Patient has obesity history of a pressure ulcer and COPD.  Social history: Married. Husband appears to be actively involved.  Family history: Unknown  Current medications include steroid-induced as well as medicines for blood pressure and Lasix. Was getting when necessary Ativan today.  Substance abuse history: Noncontributory HPI Elements:   Quality:  Delirium. Severity:  Moderate to severe disrupting her treatment. Timing:  Appears to be coincident with hospitalization. Duration:  Ongoing. Context:  Severe illness.  Past Medical History:  Past Medical History  Diagnosis  Date  . COPD (chronic obstructive pulmonary disease)   . Arthritis     Past Surgical History  Procedure Laterality Date  . Laminectomy    . Back surgery     Family History:  Family History  Problem Relation Age of Onset  . CAD Mother   . Diabetes type II Mother   . Hypertension Father   . CAD Father    Social History:  History  Alcohol Use No     History  Drug Use No    History   Social History  . Marital Status: Married    Spouse Name: N/A  . Number of Children: N/A  . Years of Education: N/A   Social History Main Topics  . Smoking status: Never Smoker   . Smokeless tobacco: Never Used  . Alcohol Use: No  . Drug Use: No  . Sexual Activity: Not on file   Other Topics Concern  . None   Social History Narrative  . None   Additional Social History:                          Allergies:   Allergies  Allergen Reactions  . Other Other (See Comments)    darvocet--pt reports blistering with this medication  . Penicillins Nausea And Vomiting  . Darvon [Propoxyphene] Other (See Comments)    Blisters    Labs:  Results for orders placed or performed during the hospital encounter of 09/14/14 (from the past 48 hour(s))  CBC     Status: Abnormal   Collection Time: 09/17/14  4:44 AM  Result Value Ref Range   WBC 5.7 3.6 - 11.0 K/uL   RBC 3.53 (L) 3.80 - 5.20 MIL/uL   Hemoglobin 10.4 (L) 12.0 -  16.0 g/dL   HCT 33.7 (L) 35.0 - 47.0 %   MCV 95.5 80.0 - 100.0 fL   MCH 29.4 26.0 - 34.0 pg   MCHC 30.8 (L) 32.0 - 36.0 g/dL   RDW 15.5 (H) 11.5 - 14.5 %   Platelets 104 (L) 150 - 440 K/uL  Basic metabolic panel     Status: Abnormal   Collection Time: 09/17/14  4:44 AM  Result Value Ref Range   Sodium 145 135 - 145 mmol/L   Potassium 4.6 3.5 - 5.1 mmol/L   Chloride 94 (L) 101 - 111 mmol/L   CO2 43 (H) 22 - 32 mmol/L   Glucose, Bld 150 (H) 65 - 99 mg/dL   BUN 27 (H) 6 - 20 mg/dL   Creatinine, Ser 0.84 0.44 - 1.00 mg/dL   Calcium 8.3 (L) 8.9 - 10.3 mg/dL    GFR calc non Af Amer >60 >60 mL/min   GFR calc Af Amer >60 >60 mL/min    Comment: (NOTE) The eGFR has been calculated using the CKD EPI equation. This calculation has not been validated in all clinical situations. eGFR's persistently <60 mL/min signify possible Chronic Kidney Disease.    Anion gap 8 5 - 15  Blood gas, arterial     Status: Abnormal   Collection Time: 09/17/14 12:20 PM  Result Value Ref Range   FIO2 0.99 %   Delivery systems BILEVEL POSITIVE AIRWAY PRESSURE    Inspiratory PAP 14    Expiratory PAP 6    pH, Arterial 7.32 (L) 7.350 - 7.450   pCO2 arterial 115 (HH) 32.0 - 48.0 mmHg    Comment: CRITICAL RESULT CALLED TO, READ BACK BY AND VERIFIED WITH: CRITICAL VALUE CALLED TO DR. Earleen Newport AT 1884 ON [id number] CORRECTED ON 07/12 AT 0707: PREVIOUSLY REPORTED AS 115 CRITICAL RESULT CALLED TO, READ BACK BY AND VERIFIED WITH: DR. Earleen Newport [phone]    pO2, Arterial 81 (L) 83.0 - 108.0 mmHg   Bicarbonate 59.3 (H) 21.0 - 28.0 mEq/L   Acid-Base Excess 26.2 (H) 0.0 - 3.0 mmol/L   O2 Saturation 95.0 %   Patient temperature 37.0    Collection site RIGHT RADIAL    Sample type ARTERIAL DRAW    Allens test (pass/fail) POSITIVE (A) PASS  Blood gas, arterial     Status: Abnormal   Collection Time: 09/17/14  9:10 PM  Result Value Ref Range   FIO2 0.80 %   Delivery systems BILEVEL POSITIVE AIRWAY PRESSURE    Rate 12 resp/min   Inspiratory PAP 16    Expiratory PAP 10    pH, Arterial 7.48 (H) 7.350 - 7.450   pCO2 arterial 93 (HH) 32.0 - 48.0 mmHg    Comment: CRITICAL VALUE NOTED.  VALUE IS CONSISTENT WITH PREVIOUSLY REPORTED AND CALLED VALUE. CRITICAL RESULT CALLED TO, READ BACK BY AND VERIFIED WITH: CRITICAL VALUE GIVEN TO DR. Stevenson Clinch AT 9323 ON [id number] BY KRISTIE LETTLEY    pO2, Arterial 69 (L) 83.0 - 108.0 mmHg   Bicarbonate 69.3 (H) 21.0 - 28.0 mEq/L   Acid-Base Excess 37.8 (H) 0.0 - 3.0 mmol/L   O2 Saturation 94.7 %   Patient temperature 37.0    Collection site RIGHT RADIAL     Sample type ARTERIAL DRAW    Allens test (pass/fail) POSITIVE (A) PASS  Basic metabolic panel     Status: Abnormal   Collection Time: 09/18/14  6:02 AM  Result Value Ref Range   Sodium 141 135 - 145 mmol/L  Potassium 4.4 3.5 - 5.1 mmol/L    Comment: HEMOLYSIS AT THIS LEVEL MAY AFFECT RESULT   Chloride 84 (L) 101 - 111 mmol/L   CO2 48 (H) 22 - 32 mmol/L   Glucose, Bld 175 (H) 65 - 99 mg/dL   BUN 30 (H) 6 - 20 mg/dL   Creatinine, Ser 0.82 0.44 - 1.00 mg/dL   Calcium 8.3 (L) 8.9 - 10.3 mg/dL   GFR calc non Af Amer >60 >60 mL/min   GFR calc Af Amer >60 >60 mL/min    Comment: (NOTE) The eGFR has been calculated using the CKD EPI equation. This calculation has not been validated in all clinical situations. eGFR's persistently <60 mL/min signify possible Chronic Kidney Disease.    Anion gap 9 5 - 15  Blood gas, arterial     Status: Abnormal   Collection Time: 09/18/14  7:55 AM  Result Value Ref Range   FIO2 80.00 %   Delivery systems BILEVEL POSITIVE AIRWAY PRESSURE    Inspiratory PAP 16    Expiratory PAP 10    pH, Arterial 7.50 (H) 7.350 - 7.450   pCO2 arterial 88 (HH) 32.0 - 48.0 mmHg    Comment: CRITICAL VALUE NOTED.  VALUE IS CONSISTENT WITH PREVIOUSLY REPORTED AND CALLED VALUE.   pO2, Arterial 72 (L) 83.0 - 108.0 mmHg   Bicarbonate 68.6 (H) 21.0 - 28.0 mEq/L   Acid-Base Excess 37.7 (H) 0.0 - 3.0 mmol/L   O2 Saturation 95.5 %   Patient temperature 37.0    Collection site RIGHT RADIAL    Sample type ARTERIAL DRAW    Allens test (pass/fail) POSITIVE (A) PASS    Vitals: Blood pressure 155/69, pulse 76, temperature 99.2 F (37.3 C), temperature source Axillary, resp. rate 20, height _0  (1.499 m), weight 202 kg (445 lb 5.3 oz), SpO2 90 %.  Risk to Self: Is patient at risk for suicide?: No Risk to Others:   Prior Inpatient Therapy:   Prior Outpatient Therapy:    Current Facility-Administered Medications  Medication Dose Route Frequency Provider Last Rate Last Dose   . acetaminophen (TYLENOL) tablet 650 mg  650 mg Oral Q6H PRN Juluis Mire, MD       Or  . acetaminophen (TYLENOL) suppository 650 mg  650 mg Rectal Q6H PRN Juluis Mire, MD      . albuterol (PROVENTIL) (2.5 MG/3ML) 0.083% nebulizer solution 2.5 mg  2.5 mg Nebulization Q6H Loletha Grayer, MD   2.5 mg at 09/18/14 1357  . antiseptic oral rinse (CPC / CETYLPYRIDINIUM CHLORIDE 0.05%) solution 7 mL  7 mL Mouth Rinse q12n4p Loletha Grayer, MD   7 mL at 09/17/14 1717  . apixaban (ELIQUIS) tablet 5 mg  5 mg Oral BID Henreitta Leber, MD   5 mg at 09/18/14 1005  . budesonide (PULMICORT) nebulizer solution 0.5 mg  0.5 mg Nebulization BID Vishal Mungal, MD   0.5 mg at 09/18/14 0728  . chlorhexidine (PERIDEX) 0.12 % solution 15 mL  15 mL Mouth Rinse BID Loletha Grayer, MD   15 mL at 09/18/14 0819  . diltiazem (CARDIZEM) 100 mg in dextrose 5 % 100 mL (1 mg/mL) infusion  5-15 mg/hr Intravenous Titrated Loletha Grayer, MD 5 mL/hr at 09/18/14 0052 5 mg/hr at 09/18/14 0052  . docusate sodium (COLACE) capsule 200 mg  200 mg Oral BID Henreitta Leber, MD   200 mg at 09/18/14 1005  . [START ON 09/19/2014] furosemide (LASIX) injection 40 mg  40 mg Intravenous  Q12H Vishal Mungal, MD      . haloperidol lactate (HALDOL) injection 0.5 mg  0.5 mg Intravenous Q4H PRN Gonzella Lex, MD   0.5 mg at 09/18/14 0318  . LORazepam (ATIVAN) injection 0.5 mg  0.5 mg Intravenous Q6H PRN Loletha Grayer, MD   0.5 mg at 09/18/14 0543  . [START ON 09/19/2014] methylPREDNISolone sodium succinate (SOLU-MEDROL) 125 mg/2 mL injection 60 mg  60 mg Intravenous Q24H Vishal Mungal, MD      . morphine CONCENTRATE 10 MG/0.5ML oral solution 10 mg  10 mg Oral Q2H PRN Loletha Grayer, MD      . ondansetron [facility]) tablet 4 mg  4 mg Oral Q6H PRN Juluis Mire, MD       Or  . ondansetron [facility]) injection 4 mg  4 mg Intravenous Q6H PRN Juluis Mire, MD   4 mg at 09/15/14 2227  . senna-docusate (Senokot-S) tablet 1 tablet  1 tablet  Oral QHS PRN Juluis Mire, MD      . sertraline (ZOLOFT) tablet 100 mg  100 mg Oral Daily Henreitta Leber, MD   100 mg at 09/18/14 1005  . tiotropium (SPIRIVA) inhalation capsule 18 mcg  18 mcg Inhalation Daily Juluis Mire, MD   18 mcg at 09/17/14 0803  . traZODone (DESYREL) tablet 100 mg  100 mg Oral QHS Henreitta Leber, MD   100 mg at 09/17/14 2235    Musculoskeletal: Strength & Muscle Tone: decreased Gait & Station: unable to stand Patient leans: N/A  Psychiatric Specialty Exam: Physical Exam  Constitutional: She appears well-developed and well-nourished. She appears distressed. She is sedated and restrained. Face mask in place.  HENT:  Head: Normocephalic and atraumatic.  Eyes: Conjunctivae are normal. Pupils are equal, round, and reactive to light.  Neck: Normal range of motion.  Cardiovascular: Normal heart sounds.   Respiratory: Effort normal.  GI: Soft.  Musculoskeletal: Normal range of motion.  Neurological: She is alert.  Skin: Skin is warm and dry.  Psychiatric: Her speech is normal and behavior is normal. Judgment and thought content normal. Her affect is blunt. She is not agitated. Cognition and memory are impaired. She exhibits abnormal recent memory.    Review of Systems  Unable to perform ROS: acuity of condition  Constitutional: Negative.   HENT: Negative.   Eyes: Negative.   Respiratory: Positive for shortness of breath.   Cardiovascular: Negative.   Gastrointestinal: Negative.   Musculoskeletal: Negative.   Skin: Negative.   Neurological: Negative.   Psychiatric/Behavioral: Negative for depression, suicidal ideas, hallucinations and substance abuse. The patient is nervous/anxious.     Blood pressure 155/69, pulse 76, temperature 99.2 F (37.3 C), temperature source Axillary, resp. rate 20, height _0  (1.499 m), weight 202 kg (445 lb 5.3 oz), SpO2 90 %.Body mass index is 89.9 kg/(m^2).  General Appearance: Disheveled  Eye Contact::  Poor   Speech:  Slurred  Volume:  Decreased  Mood:  Anxious  Affect:  Restricted  Thought Process:  Disorganized  Orientation:  Negative  Thought Content:  NA  Suicidal Thoughts:  No  Homicidal Thoughts:  No  Memory:  Negative  Judgement:  Impaired  Insight:  Lacking  Psychomotor Activity:  Restlessness  Concentration:  Poor  Recall:  Poor  Fund of Knowledge:Poor  Language: Poor  Akathisia:  No  Handed:  Right  AIMS (if indicated):     Assets:  Social Support  ADL's:  Impaired  Cognition: Impaired,  Moderate  Sleep:      Medical Decision Making: New problem, with additional work up planned, Review or order clinical lab tests (1), Discuss test with performing physician (1), Decision to obtain old records (1), Review of Medication Regimen & Side Effects (2) and Review of New Medication or Change in Dosage (2)  Treatment Plan Summary: Daily contact with patient to assess and evaluate symptoms and progress in treatment, Medication management and Plan Patient will be continued on current medication profile. She is getting only occasional when necessary haloperidol for agitation. No indication that I can see for adding extra anti-psychotic at this point. Psychoeducation completed with the patient. We'll continue to monitor. Overall seems to be improving.  Plan:  Patient does not meet criteria for psychiatric inpatient admission. Discussed crisis plan, support from social network, calling 911, coming to the Emergency Department, and calling Suicide Hotline. Disposition: Patient  Alethia Berthold 09/18/2014 5:09 PM

## 2014-09-18 NOTE — Progress Notes (Signed)
PULMONARY / CRITICAL CARE MEDICINE   Name: Eileen [name] MRN: [id number] DOB: 19-Jun-1945    ADMISSION DATE:  09/14/2014  REFERRING MD :  Dr. Earleen Newport  CHIEF COMPLAINT:  Short of breath  INITIAL PRESENTATION: 69 yo female with PMHx of COPD on home O2, morbid obesity, suspeted OSA/OHS admitted on 09/14/14 for AECOPD, AMS.  Symptoms of cough, productive sputum, dyspnea prior to admission.  Requiring continuous bipap.    HISTORY OF PRESENT ILLNESS:   69 y.o. female with a known history of COPD on home oxygen 4 L/m, degenerative joint disease was brought into the emergency room with EMS with the complaints of ongoing shortness of breath with wheezing and hypoxia with O2 saturations in the upper 70s to 80s on 09/14/2014. Per chart review she has been having progressively worsening shortness of breath for the past few days which further worsened in the past 2 days. Also gives history of ongoing cough with expectoration of brown sputum for the past 2 days. Denies any fever or chills. No chest pain. No nausea, vomiting, diarrhea, abdominal pain. Patient was placed on nonrebreather mask with 15 L of oxygen, was given DuoNeb's. EMS and brought to the emergency room for further evaluation. In the emergency room patient received further doses of DuoNeb's, was given IV Solu-Medrol and Zithromax, continued on O2 supplementation through nonrebreather mask. Since 09/14/2014 she's been having continued use of the BiPAP mask with 100% FiO2, today she had an ABG drawn that showed a PCO2 greater than 100 even though she is alert and talkative, but is moderately confused.   SUBJECTIVE:  Patient still on Bipap, periods of agitation during the night, easily arousable this morning, still with hypercapnia but with met alkalosis now.   VITAL SIGNS: Temp:  [97.8 F (36.6 C)-99.5 F (37.5 C)] 98.8 F (37.1 C) (07/12 0800) Pulse Rate:  [65-87] 80 (07/12 0800) Resp:  [12-25] 20 (07/12 0800) BP: (89-148)/(49-118) 123/66  mmHg (07/12 0800) SpO2:  [86 %-100 %] 91 % (07/12 0800) FiO2 (%):  [80 %-90 %] 80 % (07/12 0211) HEMODYNAMICS:   VENTILATOR SETTINGS: Vent Mode:  [-]  FiO2 (%):  [80 %-90 %] 80 % INTAKE / OUTPUT:  Intake/Output Summary (Last 24 hours) at 09/18/14 1124 Last data filed at 09/18/14 1013  Gross per 24 hour  Intake    360 ml  Output   4450 ml  Net  -4090 ml    PHYSICAL EXAMINATION: HENT:  Nose: No mucosal edema.  Mouth/Throat: No oropharyngeal exudate or posterior oropharyngeal edema.  Eyes: Conjunctivae, EOM and lids are normal. Pupils are equal, round, and reactive to light.  Neck: No JVD present. Carotid bruit is not present. No edema present. No thyroid mass and no thyromegaly present.  Cardiovascular: S1 normal and S2 normal. Exam reveals no gallop.  Murmur heard. Systolic murmur is present with a grade of 4/6  Pulses:  Dorsalis pedis pulses are 2+ on the right side, and 2+ on the left side.  Respiratory: Currently on BiPAP, decreased air movement at the bilateral bases, no significant wheezing appreciated, no rales, no crackles GI: Soft. Bowel sounds are normal. There is no tenderness.  Musculoskeletal:   Right ankle: She exhibits swelling.   Left ankle: She exhibits swelling.  Neuro: Alert (more than yesterday), somnolent at times but easily awakened, confusion (improved from yesterday)  LABS:  CBC  Recent Labs Lab 09/14/14 0101 09/14/14 0510 09/17/14 0444  WBC 7.5 7.2 5.7  HGB 9.6* 10.2* 10.4*  HCT 30.6* 31.7*  33.7*  PLT 108* 108* 104*   Coag's No results for input(s): APTT, INR in the last 168 hours. BMET  Recent Labs Lab 09/14/14 0101 09/17/14 0444 09/18/14 0602  NA 143 145 141  K 4.0 4.6 4.4  CL 96* 94* 84*  CO2 41* 43* 48*  BUN 28* 27* 30*  CREATININE 0.69 0.84 0.82  GLUCOSE 135* 150* 175*   Electrolytes  Recent Labs Lab 09/14/14 0101 09/17/14 0444 09/18/14 0602  CALCIUM 8.6* 8.3* 8.3*   Sepsis Markers No  results for input(s): LATICACIDVEN, PROCALCITON, O2SATVEN in the last 168 hours. ABG  Recent Labs Lab 09/17/14 1220 09/17/14 2110 09/18/14 0755  PHART 7.32* 7.48* 7.50*  PCO2ART 115* 93* 88*  PO2ART 81* 69* 72*   Liver Enzymes No results for input(s): AST, ALT, ALKPHOS, BILITOT, ALBUMIN in the last 168 hours. Cardiac Enzymes  Recent Labs Lab 09/14/14 0101  TROPONINI <0.03   Glucose  Recent Labs Lab 09/14/14 1037  GLUCAP 187*    Imaging Dg Chest Port 1 View  09/16/2014   CLINICAL DATA:  Hypoxia.  EXAM: PORTABLE CHEST - 1 VIEW  COMPARISON:  09/14/2014  FINDINGS: Increasing diffuse bilateral airspace disease/edema noted.  There is no evidence of pneumothorax.  Cardiomediastinal silhouette is unchanged.  Bibasilar atelectasis again noted.  There may be bilateral pleural effusions present.  IMPRESSION: Increasing diffuse bilateral airspace opacities/edema.   Electronically Signed   By: Margarette Canada M.D.   On: 09/16/2014 13:19      ASSESSMENT / PLAN: 69 year old female past medical history of COPD, OSA, morbid obesity, previous tobacco abuse, seen in consultation for hypercarbic respiratory failure secondary to acute exacerbation of COPD.  Acute on chronic respiratory failure with hypercarbia -Secondary to acute exacerbation of COPD -Continue with BiPAP -Mentating O2 saturations greater than 88% -Wean FiO2 as tolerated -Adjust BiPAP to maintain a leak less than 50, and appropriate ventilation and oxygenation -Patient is a DO NOT RESUSCITATE -ABG reviewed, still with hypercapnia, noted met alkalosis  - CXR today - ok to give breaks of the bipap with NRB or HFNC for about 15-20, maintain O2 sats >88% (try this 3 times per shift)  AECOPD  - Trigger unknown at this time, but multifactorial in nature: OHS, OSA, inactivity, immobility -Previously on 60 mg Solu-Medrol IV every 6, wean down to 60 mg IV daily -Continue broncho-dilators -Incentive spirometry as  tolerated -Continue antibiotics -May need to decrease Lasix dose can cause contraction alkalosis and worsening respiratory status  Metabolic alkalosis - secondary to contraction alkalosis - stop lasix 34m IV BID, transition to lasix 462mIV BID starting tomorrow  Pleural effusion - secondary to mild CHF  - consider ECHO (could not locate ECHO from current records)  History of blood clots -Continue with Eliquis.  Suspected obstructive sleep apnea versus obesity hypoventilation syndrome or a combination of both -Continue with BiPAP -ABG as needed  Social -Goals of care to be discussed with family by primary rounding team   I have personally obtained a history, examined the patient, evaluated laboratory and imaging results, formulated the assessment and plan and placed orders.  The Patient requires high complexity decision making for assessment and support, frequent evaluation and titration of therapies, application of advanced monitoring technologies and extensive interpretation of multiple databases. Critical Care Time devoted to patient care services described in this note is 35 minutes.   Overall, patient is critically ill, prognosis is guarded. Patient at high risk for cardiac arrest and death.    Vickki Igou,  MD [facility] Pager [phone] (Please enter 7-digits)

## 2014-09-19 LAB — BASIC METABOLIC PANEL
BUN: 31 mg/dL — ABNORMAL HIGH (ref 6–20)
CALCIUM: 8.2 mg/dL — AB (ref 8.9–10.3)
CO2: >50 mmol/L — ABNORMAL HIGH (ref 22–32)
Chloride: 79 mmol/L — ABNORMAL LOW (ref 101–111)
Creatinine, Ser: 0.69 mg/dL (ref 0.44–1.00)
GFR calc Af Amer: >60 mL/min (ref >60)
GFR calc non Af Amer: >60 mL/min (ref >60)
Glucose, Bld: 186 mg/dL — ABNORMAL HIGH (ref 65–99)
Potassium: 3.2 mmol/L — ABNORMAL LOW (ref 3.5–5.1)
Sodium: 140 mmol/L (ref 135–145)

## 2014-09-19 LAB — MAGNESIUM: Magnesium: 1.6 mg/dL — ABNORMAL LOW (ref 1.7–2.4)

## 2014-09-19 LAB — EXPECTORATED SPUTUM ASSESSMENT W GRAM STAIN, RFLX TO RESP C

## 2014-09-19 LAB — EXPECTORATED SPUTUM ASSESSMENT W REFEX TO RESP CULTURE

## 2014-09-19 MED ORDER — DILTIAZEM HCL 60 MG PO TABS
60.0000 mg | ORAL_TABLET | Freq: Once | ORAL | Status: AC
  Administered 2014-09-19: 60 mg via ORAL
  Filled 2014-09-19: qty 1

## 2014-09-19 MED ORDER — PREDNISONE 20 MG PO TABS
40.0000 mg | ORAL_TABLET | Freq: Every day | ORAL | Status: DC
  Administered 2014-09-20 – 2014-09-21 (×2): 40 mg via ORAL
  Filled 2014-09-19 (×2): qty 2

## 2014-09-19 MED ORDER — POTASSIUM CHLORIDE 10 MEQ/100ML IV SOLN
10.0000 meq | INTRAVENOUS | Status: AC
  Administered 2014-09-19 (×4): 10 meq via INTRAVENOUS
  Filled 2014-09-19 (×4): qty 100

## 2014-09-19 MED ORDER — POTASSIUM CHLORIDE 20 MEQ PO PACK
40.0000 meq | PACK | Freq: Once | ORAL | Status: AC
  Administered 2014-09-19: 40 meq via ORAL
  Filled 2014-09-19: qty 2

## 2014-09-19 MED ORDER — CETYLPYRIDINIUM CHLORIDE 0.05 % MT LIQD
7.0000 mL | Freq: Two times a day (BID) | OROMUCOSAL | Status: DC
  Administered 2014-09-19 – 2014-10-02 (×22): 7 mL via OROMUCOSAL

## 2014-09-19 MED ORDER — MAGNESIUM SULFATE 2 GM/50ML IV SOLN
2.0000 g | Freq: Once | INTRAVENOUS | Status: AC
  Administered 2014-09-19: 2 g via INTRAVENOUS
  Filled 2014-09-19: qty 50

## 2014-09-19 MED ORDER — DILTIAZEM HCL 30 MG PO TABS
30.0000 mg | ORAL_TABLET | Freq: Four times a day (QID) | ORAL | Status: DC
  Administered 2014-09-19 – 2014-09-23 (×15): 30 mg via ORAL
  Filled 2014-09-19 (×16): qty 1

## 2014-09-19 NOTE — Progress Notes (Signed)
Pt alert and oriented. Vitals stable- Afib on monitor- though controlled- Pt off cardizem drip- now on PO.  [facility]. Renae GlossWieting made aware- replaced potassium and magnesium- recheck in the am.  Foley in place.  Pt resting at this time.

## 2014-09-19 NOTE — Clinical Social Work Note (Signed)
CSW is following. Uncertain of discharge disposition at this time as patient had stated in initial assessment that she wanted to return home. DSS APS continues to follow. [place] SpanielMonica Daniya Aramburo MSW,LCSWA [phone]

## 2014-09-19 NOTE — Care Management Important Message (Signed)
Important Message  Patient Details  Name: Modena MorrowShirley A Bing MRN: [id number] Date of Birth: 08-23-1945   Medicare Important Message Given:  Yes-third notification given    Olegario MessierKathy A Allmond 09/19/2014, 9:10 AM

## 2014-09-19 NOTE — Progress Notes (Addendum)
PHARMACY - CRITICAL CARE PROGRESS NOTE  Pharmacy Consult for Electrolyte Management   Indication: ICU Status   Allergies  Allergen Reactions  . Other Other (See Comments)    darvocet--pt reports blistering with this medication  . Penicillins Nausea And Vomiting  . Darvon [Propoxyphene] Other (See Comments)    Blisters    Patient Measurements: Height: 4\' 11"  (149.9 cm) Weight: (!) 405 lb 10.3 oz (184 kg) IBW/kg (Calculated) : 43.2   Vital Signs: Temp: 99.1 F (37.3 C) (07/12 2300) Temp Source: Axillary (07/12 2300) BP: 132/54 mmHg (07/13 0600) Pulse Rate: 68 (07/13 0600) Intake/Output from previous day: 07/12 0701 - 07/13 0700 In: 225 [P.O.:140; I.V.:85] Out: 2950 [Urine:2950] Intake/Output from this shift: Total I/O In: 120 [P.O.:120] Out: 200 [Urine:200] Vent settings for last 24 hours: Vent Mode:  [-]  FiO2 (%):  [80 %-94 %] 80 %  Labs:  Recent Labs  09/17/14 0444 09/18/14 0602 09/19/14 0621  WBC 5.7  --   --   HGB 10.4*  --   --   HCT 33.7*  --   --   PLT 104*  --   --   CREATININE 0.84 0.82 0.69   Estimated Creatinine Clearance: 102.8 mL/min (by C-G formula based on Cr of 0.69).  No results for input(s): GLUCAP in the last 72 hours.  Microbiology: Recent Results (from the past 720 hour(s))  MRSA PCR Screening     Status: None   Collection Time: 09/14/14  7:50 PM  Result Value Ref Range Status   MRSA by PCR NEGATIVE NEGATIVE Final    Comment:        The GeneXpert MRSA Assay (FDA approved for NASAL specimens only), is one component of a comprehensive MRSA colonization surveillance program. It is not intended to diagnose MRSA infection nor to guide or monitor treatment for MRSA infections.     Medications:  Scheduled:  . albuterol  2.5 mg Nebulization Q6H  . antiseptic oral rinse  7 mL Mouth Rinse q12n4p  . apixaban  5 mg Oral BID  . budesonide (PULMICORT) nebulizer solution  0.5 mg Nebulization BID  . chlorhexidine  15 mL Mouth Rinse  BID  . diltiazem  30 mg Oral 4 times per day  . diltiazem  60 mg Oral Once  . docusate sodium  200 mg Oral BID  . methylPREDNISolone (SOLU-MEDROL) injection  60 mg Intravenous Q24H  . potassium chloride  40 mEq Oral Once  . potassium chloride  10 mEq Intravenous Q1 Hr x 4  . sertraline  100 mg Oral Daily  . tiotropium  18 mcg Inhalation Daily  . traZODone  100 mg Oral QHS   Infusions:  . diltiazem (CARDIZEM) infusion 5 mg/hr (09/18/14 2117)    Assessment: 69 yo female ICU patient on BIPAP receiving Lasix 40mg  IV BID.   Plan:   Electrolytes: Potassium 3.2. Will order mageesium level and potassium 40 mEq oral x 1. Will obtain follow-up potassium at 1800. Will obtain follow-up electrolytes with am labs.    Addendum - Magnesium 1.6, will order magnesium 2g IV x 1.   Pharmacy will continue to monitor and adjust per consult.    Simpson,Michael L 09/19/2014,9:20 AM

## 2014-09-19 NOTE — Consult Note (Signed)
[name]   Reason for Consult:  Consult for 69 year old woman currently in the hospital for respiratory failure. Concerns about hallucinations delusions and mood instability Referring Physician:  Bobbye Charleston Patient Identification: Eileen [name] MRN:  [id number] Principal Diagnosis: Acute on chronic respiratory failure with hypoxia Diagnosis:  Delirium Patient Active Problem List   Diagnosis Date Noted  . Acute delirium [R41.0] 09/17/2014  . Acute on chronic respiratory failure with hypoxia [J96.21] 09/14/2014  . COPD exacerbation [J44.1] 09/14/2014  . Pressure ulcer [L89.90] 09/14/2014    Total Time spent with patient: 45 minutes  Subjective:   Eileen [name] is a 69 y.o. female patient admitted with patient was admitted with respiratory failure. In the hospital she has exhibited symptoms of depression but also confusion and hallucinations with paranoid delirium as well. Patient is significantly more communicative today and able to give history.  HPI: Patient today reports that she is feeling much better. She has been able to stay off of her BiPAP mask for much of the day. Mood still feels down at times but overall she is optimistic and positive. Totally denies suicidal ideation. He things better. No complaints of any new mental health issues.  Medical history: Currently with respiratory failure. Patient has obesity history of a pressure ulcer and COPD.  Social history: Married. Husband appears to be actively involved.  Family history: Unknown  Current medications include steroid-induced as well as medicines for blood pressure and Lasix. Was getting when necessary Ativan today.  Substance abuse history: Noncontributory HPI Elements:   Quality:  Delirium. Severity:  Moderate to severe disrupting her treatment. Timing:  Appears to be coincident with hospitalization. Duration:  Ongoing. Context:  Severe illness.  Past Medical History:  Past Medical  History  Diagnosis Date  . COPD (chronic obstructive pulmonary disease)   . Arthritis     Past Surgical History  Procedure Laterality Date  . Laminectomy    . Back surgery     Family History:  Family History  Problem Relation Age of Onset  . CAD Mother   . Diabetes type II Mother   . Hypertension Father   . CAD Father    Social History:  History  Alcohol Use No     History  Drug Use No    History   Social History  . Marital Status: Married    Spouse Name: N/A  . Number of Children: N/A  . Years of Education: N/A   Social History Main Topics  . Smoking status: Never Smoker   . Smokeless tobacco: Never Used  . Alcohol Use: No  . Drug Use: No  . Sexual Activity: Not on file   Other Topics Concern  . None   Social History Narrative  . None   Additional Social History:                          Allergies:   Allergies  Allergen Reactions  . Other Other (See Comments)    darvocet--pt reports blistering with this medication  . Penicillins Nausea And Vomiting  . Darvon [Propoxyphene] Other (See Comments)    Blisters    Labs:  Results for orders placed or performed during the hospital encounter of 09/14/14 (from the past 48 hour(s))  Basic metabolic panel     Status: Abnormal   Collection Time: 09/18/14  6:02 AM  Result Value Ref Range   Sodium 141 135 - 145 mmol/L   Potassium 4.4  3.5 - 5.1 mmol/L    Comment: HEMOLYSIS AT THIS LEVEL MAY AFFECT RESULT   Chloride 84 (L) 101 - 111 mmol/L   CO2 48 (H) 22 - 32 mmol/L   Glucose, Bld 175 (H) 65 - 99 mg/dL   BUN 30 (H) 6 - 20 mg/dL   Creatinine, Ser 0.82 0.44 - 1.00 mg/dL   Calcium 8.3 (L) 8.9 - 10.3 mg/dL   GFR calc non Af Amer >60 >60 mL/min   GFR calc Af Amer >60 >60 mL/min    Comment: (NOTE) The eGFR has been calculated using the CKD EPI equation. This calculation has not been validated in all clinical situations. eGFR's persistently <60 mL/min signify possible Chronic Kidney Disease.     Anion gap 9 5 - 15  Blood gas, arterial     Status: Abnormal   Collection Time: 09/18/14  7:55 AM  Result Value Ref Range   FIO2 80.00 %   Delivery systems BILEVEL POSITIVE AIRWAY PRESSURE    Inspiratory PAP 16    Expiratory PAP 10    pH, Arterial 7.50 (H) 7.350 - 7.450   pCO2 arterial 88 (HH) 32.0 - 48.0 mmHg    Comment: CRITICAL VALUE NOTED.  VALUE IS CONSISTENT WITH PREVIOUSLY REPORTED AND CALLED VALUE.   pO2, Arterial 72 (L) 83.0 - 108.0 mmHg   Bicarbonate 68.6 (H) 21.0 - 28.0 mEq/L   Acid-Base Excess 37.7 (H) 0.0 - 3.0 mmol/L   O2 Saturation 95.5 %   Patient temperature 37.0    Collection site RIGHT RADIAL    Sample type ARTERIAL DRAW    Allens test (pass/fail) POSITIVE (A) PASS  Culture, expectorated sputum-assessment     Status: None   Collection Time: 09/18/14  6:15 PM  Result Value Ref Range   Specimen Description SPUTUM    Special Requests NONE    Sputum evaluation THIS SPECIMEN IS ACCEPTABLE FOR SPUTUM CULTURE    Report Status 09/19/2014 FINAL   Basic metabolic panel     Status: Abnormal   Collection Time: 09/19/14  6:21 AM  Result Value Ref Range   Sodium 140 135 - 145 mmol/L   Potassium 3.2 (L) 3.5 - 5.1 mmol/L   Chloride 79 (L) 101 - 111 mmol/L   CO2 >50 (H) 22 - 32 mmol/L   Glucose, Bld 186 (H) 65 - 99 mg/dL   BUN 31 (H) 6 - 20 mg/dL   Creatinine, Ser 0.69 0.44 - 1.00 mg/dL   Calcium 8.2 (L) 8.9 - 10.3 mg/dL   GFR calc non Af Amer >60 >60 mL/min   GFR calc Af Amer >60 >60 mL/min    Comment: (NOTE) The eGFR has been calculated using the CKD EPI equation. This calculation has not been validated in all clinical situations. eGFR's persistently <60 mL/min signify possible Chronic Kidney Disease.    Anion gap SEE COMMENTS 5 - 15    Comment: UNABLE TO CALCULATE DUE TO NON NUMERIC RESULTS  Magnesium     Status: Abnormal   Collection Time: 09/19/14  6:21 AM  Result Value Ref Range   Magnesium 1.6 (L) 1.7 - 2.4 mg/dL    Vitals: Blood pressure 126/62, pulse  80, temperature 98.4 F (36.9 C), temperature source Oral, resp. rate 19, height _0  (1.499 m), weight 184 kg (405 lb 10.3 oz), SpO2 92 %.  Risk to Self: Is patient at risk for suicide?: No Risk to Others:   Prior Inpatient Therapy:   Prior Outpatient Therapy:    Current Facility-Administered  Medications  Medication Dose Route Frequency Provider Last Rate Last Dose  . acetaminophen (TYLENOL) tablet 650 mg  650 mg Oral Q6H PRN Juluis Mire, MD   650 mg at 09/19/14 1242   Or  . acetaminophen (TYLENOL) suppository 650 mg  650 mg Rectal Q6H PRN Juluis Mire, MD      . albuterol (PROVENTIL) (2.5 MG/3ML) 0.083% nebulizer solution 2.5 mg  2.5 mg Nebulization Q6H Loletha Grayer, MD   2.5 mg at 09/19/14 1936  . antiseptic oral rinse (CPC / CETYLPYRIDINIUM CHLORIDE 0.05%) solution 7 mL  7 mL Mouth Rinse BID Loletha Grayer, MD      . apixaban (ELIQUIS) tablet 5 mg  5 mg Oral BID Henreitta Leber, MD   5 mg at 09/19/14 0933  . budesonide (PULMICORT) nebulizer solution 0.5 mg  0.5 mg Nebulization BID Vishal Mungal, MD   0.5 mg at 09/19/14 1936  . diltiazem (CARDIZEM) tablet 30 mg  30 mg Oral 4 times per day Loletha Grayer, MD   30 mg at 09/19/14 1439  . docusate sodium (COLACE) capsule 200 mg  200 mg Oral BID Henreitta Leber, MD   200 mg at 09/18/14 2241  . haloperidol lactate (HALDOL) injection 0.5 mg  0.5 mg Intravenous Q4H PRN Gonzella Lex, MD   0.5 mg at 09/18/14 0318  . LORazepam (ATIVAN) injection 0.5 mg  0.5 mg Intravenous Q6H PRN Loletha Grayer, MD   0.5 mg at 09/18/14 0543  . morphine CONCENTRATE 10 MG/0.5ML oral solution 10 mg  10 mg Oral Q2H PRN Loletha Grayer, MD   10 mg at 09/19/14 1433  . ondansetron (ZOFRAN) tablet 4 mg  4 mg Oral Q6H PRN Juluis Mire, MD       Or  . ondansetron [facility]) injection 4 mg  4 mg Intravenous Q6H PRN Juluis Mire, MD   4 mg at 09/19/14 0933  . [START ON 09/20/2014] predniSONE (DELTASONE) tablet 40 mg  40 mg Oral Q breakfast Loletha Grayer, MD      . senna-docusate (Senokot-S) tablet 1 tablet  1 tablet Oral QHS PRN Juluis Mire, MD      . sertraline (ZOLOFT) tablet 100 mg  100 mg Oral Daily Henreitta Leber, MD   100 mg at 09/19/14 0933  . tiotropium (SPIRIVA) inhalation capsule 18 mcg  18 mcg Inhalation Daily Juluis Mire, MD   18 mcg at 09/19/14 0934  . traZODone (DESYREL) tablet 100 mg  100 mg Oral QHS Henreitta Leber, MD   100 mg at 09/18/14 2241    Musculoskeletal: Strength & Muscle Tone: decreased Gait & Station: unable to stand Patient leans: N/A  Psychiatric Specialty Exam: Physical Exam  Constitutional: She appears well-developed and well-nourished. No distress. She is sedated and restrained. Face mask in place.  HENT:  Head: Normocephalic and atraumatic.  Eyes: Conjunctivae are normal. Pupils are equal, round, and reactive to light.  Neck: Normal range of motion.  Cardiovascular: Normal heart sounds.   Respiratory: Effort normal.  GI: Soft.  Musculoskeletal: Normal range of motion.  Neurological: She is alert.  Skin: Skin is warm and dry.  Psychiatric: Her speech is normal and behavior is normal. Judgment and thought content normal. Her affect is blunt. She is not agitated. Cognition and memory are impaired. She exhibits abnormal recent memory.    Review of Systems  Unable to perform ROS: acuity of condition  Constitutional: Negative.   HENT: Negative.   Eyes: Negative.  Respiratory: Positive for shortness of breath.   Cardiovascular: Negative.   Gastrointestinal: Negative.   Musculoskeletal: Negative.   Skin: Negative.   Neurological: Negative.   Psychiatric/Behavioral: Negative for depression, suicidal ideas, hallucinations and substance abuse.    Blood pressure 126/62, pulse 80, temperature 98.4 F (36.9 C), temperature source Oral, resp. rate 19, height _0  (1.499 m), weight 184 kg (405 lb 10.3 oz), SpO2 92 %.Body mass index is 81.89 kg/(m^2).  General Appearance: Disheveled   Eye Contact::  Poor  Speech:  Slurred  Volume:  Decreased  Mood:  Anxious  Affect:  Restricted  Thought Process:  Disorganized  Orientation:  Negative  Thought Content:  NA  Suicidal Thoughts:  No  Homicidal Thoughts:  No  Memory:  Negative  Judgement:  Impaired  Insight:  Lacking  Psychomotor Activity:  Restlessness  Concentration:  Poor  Recall:  Poor  Fund of Knowledge:Poor  Language: Poor  Akathisia:  No  Handed:  Right  AIMS (if indicated):     Assets:  Social Support  ADL's:  Impaired  Cognition: Impaired,  Moderate  Sleep:      Medical Decision Making: New problem, with additional work up planned, Review or order clinical lab tests (1), Discuss test with performing physician (1), Decision to obtain old records (1), Review of Medication Regimen & Side Effects (2) and Review of New Medication or Change in Dosage (2)  Treatment Plan Summary: Daily contact with patient to assess and evaluate symptoms and progress in treatment, Medication management and Plan Patient is doing much better today. Chart reviewed and discussed the case with the critical care nursing staff. Patient is much more alert and awake. Not agitated today. Not needing extra medicine for agitation. No indication to change anything about her psychiatric medicine. Current antidepressives are appropriate. If she has a return of any agitation or delirium the when necessary haloperidol intravenous can be used but I anticipate that things should continue to improve. I will follow-up as needed in the hospital.  Plan:  Patient does not meet criteria for psychiatric inpatient admission. Discussed crisis plan, support from social network, calling 911, coming to the Emergency Department, and calling Suicide Hotline. Disposition: Patient  Eileen [name] 09/19/2014 9:13 PM

## 2014-09-19 NOTE — Progress Notes (Signed)
Patient ID: Eileen [name], female   DOB: 12-12-1945, 69 y.o.   MRN: [id number] [facility] PROGRESS NOTE  HPI/Subjective: Patient is very alert today and answering all questions appropriately. Currently she is on high flow nasal cannula 85% oxygen. Some shortness of breath and coughing up phlegm. Some abdominal pain at her hernia site. Some nausea. This morning had a 10 beat run of ventricular tachycardia.  Objective: Filed Vitals:   09/19/14 1000  BP: 145/106  Pulse: 104  Temp:   Resp: 17    Intake/Output Summary (Last 24 hours) at 09/19/14 1146 Last data filed at 09/19/14 1041  Gross per 24 hour  Intake    615 ml  Output   2200 ml  Net  -1585 ml   Filed Weights   09/19/14 0500  Weight: 184 kg (405 lb 10.3 oz)    ROS: Review of Systems  Constitutional: Negative for fever and chills.  Eyes: Negative for blurred vision.  Respiratory: Positive for cough, hemoptysis, sputum production and shortness of breath.   Cardiovascular: Negative for chest pain.  Gastrointestinal: Positive for nausea and abdominal pain. Negative for vomiting, diarrhea and constipation.  Genitourinary: Negative for dysuria.  Musculoskeletal: Negative for joint pain.  Neurological: Negative for dizziness and headaches.    Exam: Physical Exam  HENT:  Nose: No mucosal edema.  Mouth/Throat: No oropharyngeal exudate or posterior oropharyngeal edema.  Eyes: Conjunctivae, EOM and lids are normal. Pupils are equal, round, and reactive to light.  Neck: No JVD present. Carotid bruit is not present. No edema present. No thyroid mass and no thyromegaly present.  Cardiovascular: S1 normal and S2 normal.  Exam reveals no gallop.   Murmur heard.  Systolic murmur is present with a grade of 4/6  Pulses:      Dorsalis pedis pulses are 2+ on the right side, and 2+ on the left side.  Respiratory: No respiratory distress. She has decreased breath sounds in the right middle field, the right lower field,  the left middle field and the left lower field. She has no wheezes. She has no rhonchi. She has no rales.  GI: Soft. Bowel sounds are normal. There is no tenderness.  Musculoskeletal:       Right ankle: She exhibits swelling.       Left ankle: She exhibits swelling.  Lymphadenopathy:    She has no cervical adenopathy.  Neurological: She is alert. No cranial nerve deficit.  Skin: Skin is warm. No rash noted. Nails show no clubbing.  Chronic lower extremity discoloration  Psychiatric: She has a normal mood and affect.    Data Reviewed: Basic Metabolic Panel:  Recent Labs Lab 09/14/14 0101 09/17/14 0444 09/18/14 0602 09/19/14 0621  NA 143 145 141 140  K 4.0 4.6 4.4 3.2*  CL 96* 94* 84* 79*  CO2 41* 43* 48* >50*  GLUCOSE 135* 150* 175* 186*  BUN 28* 27* 30* 31*  CREATININE 0.69 0.84 0.82 0.69  CALCIUM 8.6* 8.3* 8.3* 8.2*  MG  --   --   --  1.6*    CBC:  Recent Labs Lab 09/14/14 0101 09/14/14 0510 09/17/14 0444  WBC 7.5 7.2 5.7  NEUTROABS 5.8  --   --   HGB 9.6* 10.2* 10.4*  HCT 30.6* 31.7* 33.7*  MCV 93.8 93.8 95.5  PLT 108* 108* 104*     Scheduled Meds: . albuterol  2.5 mg Nebulization Q6H  . antiseptic oral rinse  7 mL Mouth Rinse q12n4p  . apixaban  5 mg Oral BID  . budesonide (PULMICORT) nebulizer solution  0.5 mg Nebulization BID  . chlorhexidine  15 mL Mouth Rinse BID  . diltiazem  30 mg Oral 4 times per day  . docusate sodium  200 mg Oral BID  . methylPREDNISolone (SOLU-MEDROL) injection  60 mg Intravenous Q24H  . potassium chloride  10 mEq Intravenous Q1 Hr x 4  . sertraline  100 mg Oral Daily  . tiotropium  18 mcg Inhalation Daily  . traZODone  100 mg Oral QHS   Continuous Infusions: . diltiazem (CARDIZEM) infusion 5 mg/hr (09/18/14 2117)    Assessment/Plan:  1. Acute on chronic respiratory failure with hypercarbia and hypoxia. COPD exacerbation.  Patient is now on 85% high flow nasal cannula.  Appreciate pulmonary consult.  Hold Lasix today.   Solu-Medrol dose decreased by pulmonary down to once a day 60 mg. finish course of Zithromax yesterday Patient is a DO NOT RESUSCITATE. High risk for cardiopulmonary arrest. Last chest x-ray radiologist read as possible ARDS. 2. History of blood clots- on Eliquis. 3. Morbid obesity and likely sleep apnea-patient currently on high flow nasal cannula. 4. Depression- on Zoloft 5. Thrombocytopenia seems stable 6. Atrial fibrillation- convert Cardizem drip over to Cardizem orally. 7. Nonsustained ventricular tachycardia. Likely caused by electrolyte abnormalities. I will hold Lasix today. Replace potassium IV and 1 dose orally. Magnesium also low will replace IV today. Recheck electrolytes tomorrow.  Code Status:     Code Status Orders        Start     Ordered   09/14/14 1323  Do not attempt resuscitation (DNR)   Continuous    Question Answer Comment  In the event of cardiac or respiratory ARREST Do not call a "code blue"   In the event of cardiac or respiratory ARREST Do not perform Intubation, CPR, defibrillation or ACLS   In the event of cardiac or respiratory ARREST Use medication by any route, position, wound care, and other measures to relive pain and suffering. May use oxygen, suction and manual treatment of airway obstruction as needed for comfort.      09/14/14 1322    Advance Directive Documentation        Most Recent Value   Type of Advance Directive  Healthcare Power of Attorney, Living will   Pre-existing out of facility DNR order (yellow form or pink MOST form)     "MOST" Form in Place?       Family Communication: Tried to call has been on the phone today but phone just kept on ringing. Disposition Plan: To be determined, oxygen requirements too high to make a disposition at this point.  Time spent: 22 minutes.  Alford HighlandWIETING, Jermell Holeman  [facility]

## 2014-09-20 LAB — MAGNESIUM: MAGNESIUM: 2 mg/dL (ref 1.7–2.4)

## 2014-09-20 LAB — BASIC METABOLIC PANEL
Anion gap: UNDETERMINED (ref 5–15)
BUN: 26 mg/dL — ABNORMAL HIGH (ref 6–20)
CHLORIDE: 79 mmol/L — AB (ref 101–111)
CO2: >50 mmol/L — ABNORMAL HIGH (ref 22–32)
Calcium: 8 mg/dL — ABNORMAL LOW (ref 8.9–10.3)
Creatinine, Ser: 0.64 mg/dL (ref 0.44–1.00)
GFR calc Af Amer: >60 mL/min (ref >60)
GFR calc non Af Amer: >60 mL/min (ref >60)
GLUCOSE: 186 mg/dL — AB (ref 65–99)
POTASSIUM: 4.2 mmol/L (ref 3.5–5.1)
Sodium: 138 mmol/L (ref 135–145)

## 2014-09-20 LAB — GLUCOSE, CAPILLARY
GLUCOSE-CAPILLARY: 226 mg/dL — AB (ref 65–99)
Glucose-Capillary: 261 mg/dL — ABNORMAL HIGH (ref 65–99)

## 2014-09-20 MED ORDER — DEXTROMETHORPHAN POLISTIREX ER 30 MG/5ML PO SUER
30.0000 mg | Freq: Two times a day (BID) | ORAL | Status: DC
  Administered 2014-09-21 (×2): 30 mg via ORAL
  Filled 2014-09-20 (×9): qty 5

## 2014-09-20 MED ORDER — INSULIN ASPART 100 UNIT/ML ~~LOC~~ SOLN
0.0000 [IU] | Freq: Three times a day (TID) | SUBCUTANEOUS | Status: DC
  Administered 2014-09-20 – 2014-09-21 (×2): 11 [IU] via SUBCUTANEOUS
  Administered 2014-09-21: 15 [IU] via SUBCUTANEOUS
  Administered 2014-09-21: 3 [IU] via SUBCUTANEOUS
  Administered 2014-09-22 (×2): 7 [IU] via SUBCUTANEOUS
  Administered 2014-09-22 – 2014-09-24 (×5): 11 [IU] via SUBCUTANEOUS
  Administered 2014-09-24 (×2): 100 [IU] via SUBCUTANEOUS
  Administered 2014-09-25: 11 [IU] via SUBCUTANEOUS
  Administered 2014-09-25: 3 [IU] via SUBCUTANEOUS
  Administered 2014-09-25: 11 [IU] via SUBCUTANEOUS
  Administered 2014-09-26 (×3): 4 [IU] via SUBCUTANEOUS
  Administered 2014-09-27: 17:00:00 11 [IU] via SUBCUTANEOUS
  Administered 2014-09-27 (×2): 3 [IU] via SUBCUTANEOUS
  Administered 2014-09-28: 7 [IU] via SUBCUTANEOUS
  Administered 2014-09-28: 4 [IU] via SUBCUTANEOUS
  Administered 2014-09-29: 18:00:00 11 [IU] via SUBCUTANEOUS
  Administered 2014-09-29 (×2): 4 [IU] via SUBCUTANEOUS
  Administered 2014-09-30: 11 [IU] via SUBCUTANEOUS
  Administered 2014-09-30 – 2014-10-01 (×2): 4 [IU] via SUBCUTANEOUS
  Administered 2014-10-01: 14:00:00 3 [IU] via SUBCUTANEOUS
  Administered 2014-10-02: 18:00:00 4 [IU] via SUBCUTANEOUS
  Administered 2014-10-03: 3 [IU] via SUBCUTANEOUS
  Filled 2014-09-20: qty 4
  Filled 2014-09-20: qty 7
  Filled 2014-09-20: qty 1
  Filled 2014-09-20: qty 11
  Filled 2014-09-20: qty 4
  Filled 2014-09-20: qty 11
  Filled 2014-09-20: qty 3
  Filled 2014-09-20: qty 11
  Filled 2014-09-20 (×2): qty 7
  Filled 2014-09-20 (×2): qty 4
  Filled 2014-09-20: qty 11
  Filled 2014-09-20 (×2): qty 4
  Filled 2014-09-20: qty 7
  Filled 2014-09-20: qty 11
  Filled 2014-09-20 (×2): qty 3
  Filled 2014-09-20: qty 11
  Filled 2014-09-20: qty 4
  Filled 2014-09-20: qty 15
  Filled 2014-09-20 (×2): qty 11
  Filled 2014-09-20: qty 4
  Filled 2014-09-20: qty 11
  Filled 2014-09-20: qty 4
  Filled 2014-09-20: qty 7
  Filled 2014-09-20: qty 11
  Filled 2014-09-20: qty 3
  Filled 2014-09-20 (×2): qty 11
  Filled 2014-09-20: qty 3

## 2014-09-20 MED ORDER — DM-GUAIFENESIN ER 30-600 MG PO TB12
1.0000 | ORAL_TABLET | Freq: Two times a day (BID) | ORAL | Status: DC
  Filled 2014-09-20 (×3): qty 1

## 2014-09-20 MED ORDER — INSULIN ASPART 100 UNIT/ML ~~LOC~~ SOLN
0.0000 [IU] | Freq: Every day | SUBCUTANEOUS | Status: DC
  Administered 2014-09-20: 2 [IU] via SUBCUTANEOUS
  Administered 2014-09-21 – 2014-09-24 (×3): 3 [IU] via SUBCUTANEOUS
  Filled 2014-09-20 (×4): qty 3
  Filled 2014-09-20: qty 2

## 2014-09-20 MED ORDER — GUAIFENESIN ER 600 MG PO TB12
600.0000 mg | ORAL_TABLET | Freq: Two times a day (BID) | ORAL | Status: DC
  Administered 2014-09-20 – 2014-10-03 (×25): 600 mg via ORAL
  Filled 2014-09-20 (×25): qty 1

## 2014-09-20 NOTE — Progress Notes (Addendum)
Inpatient Diabetes Program Recommendations  AACE/ADA: New Consensus Statement on Inpatient Glycemic Control (2013)  Target Ranges:  Prepandial:   less than 140 mg/dL      Peak postprandial:   less than 180 mg/dL (1-2 hours)      Critically ill patients:  140 - 180 mg/dL   Reason for assessment: elevated lab glucose  Diabetes history: none- elevated lab glucose  Outpatient Diabetes medications: none Current orders for Inpatient glycemic control: none  Please consider ordering an A1C to determine blood sugar control- family hx., diabetes, age, weight and steroid use put her at risk of hyperglycemia.  Consider ordering CBG tid and hs and correct with Novolog resistant scale insulin 0-20 units tid and hs.   Susette RacerJulie Ola Fawver, RN, BA, MHA, CDE Diabetes Coordinator Inpatient Diabetes Program  [phone] (Team Pager) [phone] [facility] Office) 09/20/2014 1:52 PM

## 2014-09-20 NOTE — Care Management Note (Signed)
Case Management Note  Patient Details  Name: Eileen [name] MRN: [id number] Date of Birth: 11-18-1945  Subjective/Objective:   Remains on Bipap at 80% FIO2. Runs of V-tach yesterday with electrolyte replacement. Acute delirium improving. Cardizem gtt changed to PO. Following progress. As CSW noted, disposition unknown at this time                 Action/Plan:   Expected Discharge Date:                  Expected Discharge Plan:     In-House Referral:     Discharge planning Services     Post Acute Care Choice:    Choice offered to:     DME Arranged:    DME Agency:     HH Arranged:    HH Agency:     Status of Service:     Medicare Important Message Given:  Yes-third notification given Date Medicare IM Given:    Medicare IM give by:    Date Additional Medicare IM Given:    Additional Medicare Important Message give by:     If discussed at Long Length of Stay Meetings, dates discussed:    Additional Comments:  Marily MemosLisa M Tamiki Kuba, RN 09/20/2014, 8:46 AM

## 2014-09-20 NOTE — Care Management Note (Addendum)
Case Management Note  Patient Details  Name: Eileen [name] MRN: [id number] Date of Birth: 1946-03-03  Subjective/Objective:   [facility]                 Action/Plan:   Expected Discharge Date:                  Expected Discharge Plan:     In-House Referral:     Discharge planning Services     Post Acute Care Choice:    Choice offered to:     DME Arranged:    DME Agency:     HH Arranged:    HH Agency:     Status of Service:     Medicare Important Message Given:  Yes-third notification given Date Medicare IM Given:    Medicare IM give by:    Date Additional Medicare IM Given:    Additional Medicare Important Message give by:     If discussed at Long Length of Stay Meetings, dates discussed:    Additional Comments:  Marily MemosLisa M Latravia Southgate, RN 09/20/2014, 12:57 PM

## 2014-09-20 NOTE — Progress Notes (Signed)
PHARMACY - CRITICAL CARE PROGRESS NOTE  Pharmacy Consult for Electrolyte Management   Indication: ICU Status   Allergies  Allergen Reactions  . Other Other (See Comments)    darvocet--pt reports blistering with this medication  . Penicillins Nausea And Vomiting  . Darvon [Propoxyphene] Other (See Comments)    Blisters    Patient Measurements: Height: 4\' 11"  (149.9 cm) Weight: (!) 412 lb 4.2 oz (187 kg) IBW/kg (Calculated) : 43.2   Vital Signs: Temp: 98.7 F (37.1 C) (07/14 0711) Temp Source: Oral (07/14 0711) BP: 142/85 mmHg (07/14 0600) Pulse Rate: 70 (07/14 0711) Intake/Output from previous day: 07/13 0701 - 07/14 0700 In: 585 [P.O.:120; I.V.:15; IV Piggyback:450] Out: 1025 [Urine:1025] Intake/Output from this shift:   Vent settings for last 24 hours: Vent Mode:  [-]  FiO2 (%):  [80 %-85 %] 80 %  Labs:  Recent Labs  09/18/14 0602 09/19/14 0621 09/20/14 0422  CREATININE 0.82 0.69 0.64  MG  --  1.6* 2.0   Estimated Creatinine Clearance: 104 mL/min (by C-G formula based on Cr of 0.64).  No results for input(s): GLUCAP in the last 72 hours.  Microbiology: Recent Results (from the past 720 hour(s))  MRSA PCR Screening     Status: None   Collection Time: 09/14/14  7:50 PM  Result Value Ref Range Status   MRSA by PCR NEGATIVE NEGATIVE Final    Comment:        The GeneXpert MRSA Assay (FDA approved for NASAL specimens only), is one component of a comprehensive MRSA colonization surveillance program. It is not intended to diagnose MRSA infection nor to guide or monitor treatment for MRSA infections.   Culture, expectorated sputum-assessment     Status: None   Collection Time: 09/18/14  6:15 PM  Result Value Ref Range Status   Specimen Description SPUTUM  Final   Special Requests NONE  Final   Sputum evaluation THIS SPECIMEN IS ACCEPTABLE FOR SPUTUM CULTURE  Final   Report Status 09/19/2014 FINAL  Final    Medications:  Scheduled:  . albuterol   2.5 mg Nebulization Q6H  . antiseptic oral rinse  7 mL Mouth Rinse BID  . apixaban  5 mg Oral BID  . budesonide (PULMICORT) nebulizer solution  0.5 mg Nebulization BID  . diltiazem  30 mg Oral 4 times per day  . docusate sodium  200 mg Oral BID  . predniSONE  40 mg Oral Q breakfast  . sertraline  100 mg Oral Daily  . tiotropium  18 mcg Inhalation Daily  . traZODone  100 mg Oral QHS   Infusions:     Assessment: 69 yo female ICU patient on BIPAP patient no longer on Lasix.    Plan:   Electrolytes: Electrolytes are WNL.Will obtain follow-up electrolytes with am labs.     Pharmacy will continue to monitor and adjust per consult.    Belma Dyches L 09/20/2014,8:58 AM

## 2014-09-20 NOTE — Clinical Social Work Note (Signed)
Clinical Social Worker updated Surveyor, mineralsWanda [name] with [employer] [facility] [place] [facility]. Ms. Lorin PicketScott visited pt in her room. PT eval is needed, when medically appropriate, to determine if pt would benefit from SNF. CSW will continue to follow.   Dede QuerySarah Yanis Juma, MSW, LCSW Clinical Social Worker  [phone]

## 2014-09-20 NOTE — Consult Note (Signed)
  Psychiatry: Follow-up for this 69 year old woman with a history of depression. Today when I came to see her she was back on her BiPAP mask. She was able to tell me however that she didn't feel like her mood was any worse. No return of significant depression. No suicidal ideation. Continues to feel optimistic.  On review of systems she has no new complaints. No complaints of depression suicidal thought no psychotic symptoms. Ongoing difficulty with breathing no other changes to review of systems.  On mental status this is a chronically and acutely ill woman. Good eye contact. Psychomotor activity normal for the situation. Speech is decreased in total amount. Easy to understand. Affect is smiling and euthymic. Mood stated as okay. Thoughts lucid no loosening of associations. Denies suicidal or homicidal ideation.  Continue current medications for depression. Supportive counseling completed. No change to treatment plan.

## 2014-09-20 NOTE — Progress Notes (Signed)
Patient ID: Eileen [name], female   DOB: [birth year], 69 y.o.   MRN: [id number]  [facility] PRO[facility]  HPI/Subjective: Patient complains of some right arm pain and difficulty moving upper right arm. Still with some cough and some shortness of breath but overall feeling a little bit better.   Objective: Filed Vitals:   09/20/14 1200  BP: 106/59  Pulse:   Temp:   Resp: 20    Intake/Output Summary (Last 24 hours) at 09/20/14 1415 Last data filed at 09/20/14 1000  Gross per 24 hour  Intake    360 ml  Output    775 ml  Net   -415 ml   Filed Weights   09/19/14 0500 09/20/14 0500  Weight: 184 kg (405 lb 10.3 oz) 187 kg (412 lb 4.2 oz)    ROS: Review of Systems  Constitutional: Negative for fever and chills.  Eyes: Negative for blurred vision.  Respiratory: Positive for cough and shortness of breath. Negative for sputum production.   Cardiovascular: Negative for chest pain.  Gastrointestinal: Negative for nausea, vomiting, abdominal pain, diarrhea and constipation.  Genitourinary: Negative for dysuria.  Musculoskeletal: Negative for joint pain.  Neurological: Negative for dizziness and headaches.    Exam: Physical Exam  HENT:  Nose: No mucosal edema.  Mouth/Throat: No oropharyngeal exudate or posterior oropharyngeal edema.  Eyes: Conjunctivae, EOM and lids are normal. Pupils are equal, round, and reactive to light.  Neck: No JVD present. Carotid bruit is not present. No edema present. No thyroid mass and no thyromegaly present.  Cardiovascular: S1 normal and S2 normal.  Exam reveals no gallop.   Murmur heard.  Systolic murmur is present with a grade of 4/6  Pulses:      Dorsalis pedis pulses are 2+ on the right side, and 2+ on the left side.  Respiratory: No respiratory distress. She has decreased breath sounds in the right middle field, the right lower field, the left middle field and the left lower field. She has no wheezes. She has no rhonchi. She  has no rales.  GI: Soft. Bowel sounds are normal. There is no tenderness.  Musculoskeletal:       Right ankle: She exhibits swelling.       Left ankle: She exhibits swelling.  Lymphadenopathy:    She has no cervical adenopathy.  Neurological: She is alert. No cranial nerve deficit.  Skin: Skin is warm. No rash noted. Nails show no clubbing.  Chronic lower extremity discoloration  Psychiatric: She has a normal mood and affect.    Data Reviewed: Basic Metabolic Panel:  Recent Labs Lab 09/14/14 0101 09/17/14 0444 09/18/14 0602 09/19/14 0621 09/20/14 0422  NA 143 145 141 140 138  K 4.0 4.6 4.4 3.2* 4.2  CL 96* 94* 84* 79* 79*  CO2 41* 43* 48* >50* >50*  GLUCOSE 135* 150* 175* 186* 186*  BUN 28* 27* 30* 31* 26*  CREATININE 0.69 0.84 0.82 0.69 0.64  CALCIUM 8.6* 8.3* 8.3* 8.2* 8.0*  MG  --   --   --  1.6* 2.0    CBC:  Recent Labs Lab 09/14/14 0101 09/14/14 0510 09/17/14 0444  WBC 7.5 7.2 5.7  NEUTROABS 5.8  --   --   HGB 9.6* 10.2* 10.4*  HCT 30.6* 31.7* 33.7*  MCV 93.8 93.8 95.5  PLT 108* 108* 104*     Scheduled Meds: . albuterol  2.5 mg Nebulization Q6H  . antiseptic oral rinse  7 mL Mouth Rinse BID  .  apixaban  5 mg Oral BID  . budesonide (PULMICORT) nebulizer solution  0.5 mg Nebulization BID  . diltiazem  30 mg Oral 4 times per day  . docusate sodium  200 mg Oral BID  . predniSONE  40 mg Oral Q breakfast  . sertraline  100 mg Oral Daily  . tiotropium  18 mcg Inhalation Daily  . traZODone  100 mg Oral QHS   Continuous Infusions:    Assessment/Plan:  1. Acute on chronic respiratory failure with hypercarbia and hypoxia. COPD exacerbation.  Patient is now on 80% .  BiPAP Appreciate pulmonary consult.  Hold Lasix today.  Patient switched to prednisone. Patient  finished course of Zithromax yesterday Patient is a DO NOT RESUSCITATE. High risk for cardiopulmonary arrest.  2. History of blood clots- on Eliquis. 3. Morbid obesity and likely sleep  apnea-patient currently on BiPAP . 4. Depression- on Zoloft 5. Thrombocytopenia seems stable 6. Atrial fibrillation- convert Cardizem drip over to Cardizem orally. 7. Nonsustained ventricular tachycardia. Likely caused by electrolyte abnormalities. Electrolytes replaced yesterday. Lasix held.   Code Status:     Code Status Orders        Start     Ordered   09/14/14 1323  Do not attempt resuscitation (DNR)   Continuous    Question Answer Comment  In the event of cardiac or respiratory ARREST Do not call a "code blue"   In the event of cardiac or respiratory ARREST Do not perform Intubation, CPR, defibrillation or ACLS   In the event of cardiac or respiratory ARREST Use medication by any route, position, wound care, and other measures to relive pain and suffering. May use oxygen, suction and manual treatment of airway obstruction as needed for comfort.      09/14/14 1322    Advance Directive Documentation        Most Recent Value   Type of Advance Directive  Healthcare Power of Attorney, Living will   Pre-existing out of facility DNR order (yellow form or pink MOST form)     "MOST" Form in Place?       Disposition Plan: To be determined, oxygen requirements too high to make a disposition at this point.  Time spent: 24 minutes.  Alford Highland  [facility]

## 2014-09-21 LAB — CULTURE, RESPIRATORY W GRAM STAIN

## 2014-09-21 LAB — GLUCOSE, CAPILLARY
Glucose-Capillary: 131 mg/dL — ABNORMAL HIGH (ref 65–99)
Glucose-Capillary: 252 mg/dL — ABNORMAL HIGH (ref 65–99)
Glucose-Capillary: 304 mg/dL — ABNORMAL HIGH (ref 65–99)
Glucose-Capillary: 276 mg/dL — ABNORMAL HIGH (ref 65–99)

## 2014-09-21 LAB — CULTURE, RESPIRATORY

## 2014-09-21 MED ORDER — VANCOMYCIN HCL 10 G IV SOLR
1500.0000 mg | Freq: Two times a day (BID) | INTRAVENOUS | Status: DC
  Administered 2014-09-21 – 2014-09-23 (×5): 1500 mg via INTRAVENOUS
  Filled 2014-09-21 (×6): qty 1500

## 2014-09-21 MED ORDER — METHYLPREDNISOLONE SODIUM SUCC 125 MG IJ SOLR
60.0000 mg | Freq: Four times a day (QID) | INTRAMUSCULAR | Status: DC
  Administered 2014-09-21 – 2014-09-24 (×12): 60 mg via INTRAVENOUS
  Filled 2014-09-21 (×12): qty 2

## 2014-09-21 MED ORDER — IPRATROPIUM-ALBUTEROL 0.5-2.5 (3) MG/3ML IN SOLN
3.0000 mL | RESPIRATORY_TRACT | Status: DC
  Administered 2014-09-21 – 2014-09-28 (×39): 3 mL via RESPIRATORY_TRACT
  Filled 2014-09-21 (×40): qty 3

## 2014-09-21 NOTE — Progress Notes (Signed)
Pt alert and oriented x4 this am. This afternoon confusion/agitation increased. Period of bipap tried. And Ativan 0.5mg  given.  Sats good on high flow nasal cannula and cooperates better with wearing HFNC than bipap. 7/12 sputum with moderate MRSA growth resulted and Dr Clent RidgesWalsh made aware.  Vancomycin iv ordered and given. Contact precautions initiated. Blood sugars are elevated >200 today.  Solumedrol started and dietician added more frequent snacks. Pt prefers to eat high calorie ice cream ,pudding and muffin offerings.

## 2014-09-21 NOTE — Consult Note (Signed)
  Psychiatry: Follow-up for this patient with depression currently in the hospital for continued treatment of respiratory failure. Patient tells me that she is feeling better today. Mood improves every day. Still gets down a little bit and lonely when her family are not here.  Physically she is still having some trouble breathing but feels like she is getting stronger. On mental status the patient is better groomed. Makes good eye contact. Psychomotor activity normal given her physical disability. She is alert and oriented 4. Judgment and insight appear good. Denies suicidal ideation.  No change to diagnosis. No change to medications. Reviewed medicines reviewed's and. Continue follow-up as needed.

## 2014-09-21 NOTE — Progress Notes (Signed)
[facility] -  at [facility]   PATIENT NAME: Eileen [name]    MR#:  [id number]  DATE OF BIRTH:  [birth year]  SUBJECTIVE:  CHIEF COMPLAINT:   Chief Complaint  Patient presents with  . Shortness of Breath   Continues to be short of breath even at rest, continues to be on high flow oxygen via nasal cannula  REVIEW OF SYSTEMS:   Review of Systems  Constitutional: Negative for fever.  Respiratory: Positive for cough, sputum production and shortness of breath.   Cardiovascular: Negative for chest pain and palpitations.  Gastrointestinal: Negative for nausea, vomiting and abdominal pain.  Genitourinary: Negative for dysuria.    DRUG ALLERGIES:   Allergies  Allergen Reactions  . Other Other (See Comments)    darvocet--pt reports blistering with this medication  . Penicillins Nausea And Vomiting  . Darvon [Propoxyphene] Other (See Comments)    Blisters    VITALS:  Blood pressure 135/58, pulse 49, temperature 98.9 F (37.2 C), temperature source Oral, resp. rate 17, height  (1.499 m), weight 188 kg (414 lb 7.4 oz), SpO2 96 %.  PHYSICAL EXAMINATION:  GENERAL:  69 y.o.-year-old patient lying in the bed with no acute distress. Obese, on high flow nasal cannula EYES: Pupils equal, round, reactive to light and accommodation. No scleral icterus. Extraocular muscles intact.  HEENT: Head atraumatic, normocephalic. Oropharynx and nasopharynx clear.  NECK:  Supple, no jugular venous distention. No thyroid enlargement, no tenderness.  LUNGS: Diminished breath sounds, distant, scattered wheezes, poor air movement CARDIOVASCULAR: Distant S1, S2 normal. No murmurs, rubs, or gallops.  ABDOMEN: Soft, nontender, nondistended. Bowel sounds present. No organomegaly or mass.  EXTREMITIES: 1+ pedal edema, cyanosis, or clubbing.  NEUROLOGIC: Cranial nerves II through XII are intact. Muscle strength 5/5 in all extremities. Sensation intact. Gait not checked.   PSYCHIATRIC: The patient is alert and oriented x 3. Depressed appearing SKIN: No obvious rash, lesion, or ulcer.    LABORATORY PANEL:   CBC  Recent Labs Lab 09/17/14 0444  WBC 5.7  HGB 10.4*  HCT 33.7*  PLT 104*   ------------------------------------------------------------------------------------------------------------------  Chemistries   Recent Labs Lab 09/20/14 0422  NA 138  K 4.2  CL 79*  CO2 >50*  GLUCOSE 186*  BUN 26*  CREATININE 0.64  CALCIUM 8.0*  MG 2.0   ------------------------------------------------------------------------------------------------------------------  Cardiac Enzymes No results for input(s): TROPONINI in the last 168 hours. ------------------------------------------------------------------------------------------------------------------  RADIOLOGY:  No results found.  EKG:   Orders placed or performed during the hospital encounter of 09/14/14  . EKG 12-Lead  . EKG 12-Lead    ASSESSMENT AND PLAN:   1. Acute on chronic respiratory failure with hypercarbia and hypoxia. Chest x-ray 7/12 with bilateral interstitial opacities and changes concerning for ARDS. Sputum culture today has returned positive for heavy growth of MRSA. Additionally the patient is morbidly obese with obesity hypoventilation and sleep apnea as well as COPD - Have discussed with pulmonology, Dr. Meredeth Ide will see the patient today - Start vancomycin to cover MRSA pneumonia - Continue high flow nasal cannula - Increase steroid restart Solu-Medrol 60 mg every 6 hours, discontinue prednisone - Increase frequency of nebulizer treatments, start DuoNeb rather than albuterol alone - Continue BiPAP at night  2. History of blood clots- on Eliquis. 3. Morbid obesity and likely sleep apnea-patient currently on BiPAP at night 4. Depression- on Zoloft, appreciate psychiatry consultation. No changes to medications. 5. Thrombocytopenia seems stable 6. Atrial fibrillation-  continue Cardizem orally. 7. Nonsustained ventricular  tachycardia. Likely caused by electrolyte abnormalities. Electrolytes replaced yesterday. Lasix held.  All the records are reviewed and case discussed with Care Management/Social Workerr. Management plans discussed with the patient, family and they are in agreement.  CODE STATUS: DO NOT RESUSCITATE  TOTAL TIME TAKING CARE OF THIS PATIENT: 45 minutes.  Greater than 50% of time spent in care coordination and counseling. POSSIBLE D/C IN 3-4 DAYS, DEPENDING ON CLINICAL CONDITION. She may be a good candidate for long-term acute care facility as she has not made significant progress to wean.   Eileen [name], Eileen [name] M.D on 09/21/2014 at 2:14 PM  Between 7am to 6pm - Pager - [phone]  After 6pm go to [email or web address] - password [email or web address]  [facility]  Office  [phone]  CC: Primary care physician; Pcp Not In System

## 2014-09-21 NOTE — Progress Notes (Signed)
PHARMACY - CRITICAL CARE PROGRESS NOTE  Pharmacy Consult for Electrolyte Replacement Indication: ICU patient   Allergies  Allergen Reactions  . Other Other (See Comments)    darvocet--pt reports blistering with this medication  . Penicillins Nausea And Vomiting  . Darvon [Propoxyphene] Other (See Comments)    Blisters    Patient Measurements: Height: 4\' 11"  (149.9 cm) Weight: (!) 414 lb 7.4 oz (188 kg) IBW/kg (Calculated) : 43.2   Vital Signs: Temp: 99.4 F (37.4 C) (07/15 0730) Temp Source: Oral (07/15 0730) BP: 133/58 mmHg (07/15 0800) Pulse Rate: 77 (07/15 0800) Intake/Output from previous day: 07/14 0701 - 07/15 0700 In: 840 [P.O.:840] Out: 1050 [Urine:1050] Intake/Output from this shift: Total I/O In: 240 [P.O.:240] Out: 150 [Urine:150] Vent settings for last 24 hours: Vent Mode:  [-]  FiO2 (%):  [50 %-80 %] 50 %  Labs:  Recent Labs  09/19/14 0621 09/20/14 0422  CREATININE 0.69 0.64  MG 1.6* 2.0   Estimated Creatinine Clearance: 104.4 mL/min (by C-G formula based on Cr of 0.64).   Recent Labs  09/20/14 1733 09/20/14 2211 09/21/14 0843  GLUCAP 261* 226* 131*    Microbiology: Recent Results (from the past 720 hour(s))  MRSA PCR Screening     Status: None   Collection Time: 09/14/14  7:50 PM  Result Value Ref Range Status   MRSA by PCR NEGATIVE NEGATIVE Final    Comment:        The GeneXpert MRSA Assay (FDA approved for NASAL specimens only), is one component of a comprehensive MRSA colonization surveillance program. It is not intended to diagnose MRSA infection nor to guide or monitor treatment for MRSA infections.   Culture, expectorated sputum-assessment     Status: None   Collection Time: 09/18/14  6:15 PM  Result Value Ref Range Status   Specimen Description SPUTUM  Final   Special Requests NONE  Final   Sputum evaluation THIS SPECIMEN IS ACCEPTABLE FOR SPUTUM CULTURE  Final   Report Status 09/19/2014 FINAL  Final  Culture,  respiratory (NON-Expectorated)     Status: None (Preliminary result)   Collection Time: 09/18/14  6:15 PM  Result Value Ref Range Status   Specimen Description SPUTUM  Final   Special Requests NONE Reflexed from [id number]  Final   Gram Stain   Final    FAIR SPECIMEN - 70-80% WBCS FEW WBC SEEN RARE GRAM POSITIVE COCCI IN CLUSTERS    Culture   Final    HEAVY GROWTH STAPHYLOCOCCUS AUREUS SUSCEPTIBILITIES TO FOLLOW    Report Status PENDING  Incomplete    Medications:  Scheduled:  . albuterol  2.5 mg Nebulization Q6H  . antiseptic oral rinse  7 mL Mouth Rinse BID  . apixaban  5 mg Oral BID  . budesonide (PULMICORT) nebulizer solution  0.5 mg Nebulization BID  . dextromethorphan  30 mg Oral BID  . diltiazem  30 mg Oral 4 times per day  . docusate sodium  200 mg Oral BID  . guaiFENesin  600 mg Oral BID  . insulin aspart  0-20 Units Subcutaneous TID WC  . insulin aspart  0-5 Units Subcutaneous QHS  . predniSONE  40 mg Oral Q breakfast  . sertraline  100 mg Oral Daily  . tiotropium  18 mcg Inhalation Daily  . traZODone  100 mg Oral QHS   Infusions:    Assessment: No new labs for review today. Potassium was WNL on 7/14 after supplementation. Lasix has been stopped.  Plan:  Will  follow up on am labs.  Foye Deer 09/21/2014,9:37 AM

## 2014-09-21 NOTE — Care Management Important Message (Signed)
Important Message  Patient Details  Name: Eileen [name] MRN: [id number] Date of Birth: [birth year]   Medicare Important Message Given:  Yes-fourth notification given    Verita SchneidersKathy A Allmond 09/21/2014, 9:56 AM

## 2014-09-21 NOTE — Progress Notes (Signed)
Son approached nurses desk and stated that his mom was not appropriate or at baseline where she was yesterday.  Spoke with Dr. Clint GuyHower,  Last CO2>50.  Last ABG 7/12.  Order received for ABG to be done.

## 2014-09-21 NOTE — Progress Notes (Signed)
Per Lady Saucierileshia Harbison CPT, ABG results called to Dr. Clint GuyHower.  No change in treatment at this time.

## 2014-09-21 NOTE — Progress Notes (Signed)
Nutrition Follow-up   INTERVENTION:   1) Meals and Snacks: Cater to patient preferences; pt now tolerating more solid foods, will discontinue misc order for liquids to be mostly sent on trays; pt reports she cannot eat much at one times, will send high protein foods between meals (chicken salad, egg salad with crackerws) 2) Medical Food Supplement Therapy: pt does not like Magic Cup, will discontinue order. No supplement reordered as intake improving, adding snacks   NUTRITION DIAGNOSIS:   Inadequate oral intake related to acute illness as evidenced by meal completion < 25%. Improving as pt eating >50% of meals on average, eating more solid foods   GOAL:   Patient will meet greater than or equal to 90% of their needs  MONITOR:    (Energy intake, Pulmonary profile)  ASSESSMENT:   Pt on HFNC, Bipap at night; pt reports nausea due to swallowing muscous  Diet Order:  Diet regular Room service appropriate?: Yes; Fluid consistency:: Thin   Energy Intake:  Po intake improving, pt eating betweeen 50-75% of meals yesterday, taking more solid food. Does not like [employer] C[employer], sick of puddings  Skin:   (stage I pressure ulcer)  Last BM:  7/13   Digestive System: +nausea at times, no vomitting  Height:   Ht Readings from Last 1 Encounters:  09/17/14 4\' 11"  (1.499 m)    Weight:   Wt Readings from Last 1 Encounters:  09/21/14 414 lb 7.4 oz (188 kg)    Filed Weights   09/19/14 0500 09/20/14 0500 09/21/14 0500  Weight: 405 lb 10.3 oz (184 kg) 412 lb 4.2 oz (187 kg) 414 lb 7.4 oz (188 kg)     Wt Readings from Last 10 Encounters:  09/21/14 414 lb 7.4 oz (188 kg)    BMI:  Body mass index is 83.67 kg/(m^2).  Estimated Nutritional Needs:   Kcal:  Using IBW of 44kg (BEE 865 kcals (IF 1.0-1.2, Af 1.2) [id number] kcals/d  Protein:  Using IBW of 44kg (1.0-1.2 g/kg) 44-53 gm/d  Fluid:  Using IBW of 44kg (25-330ml/kg)1100-1320ml/d  EDUCATION NEEDS:   No education needs identified  at this time  LOW Care Level  Romelle Starcherate Levante Simones MS, RD, LDN [phone] Pager

## 2014-09-21 NOTE — Progress Notes (Signed)
ANTIBIOTIC CONSULT NOTE - INITIAL  Pharmacy Consult for Vancomycin Indication: rule out pneumonia  Allergies  Allergen Reactions  . Other Other (See Comments)    darvocet--pt reports blistering with this medication  . Penicillins Nausea And Vomiting  . Darvon [Propoxyphene] Other (See Comments)    Blisters    Patient Measurements: Height:  (149.9 cm) Weight: (!) 414 lb 7.4 oz (188 kg) IBW/kg (Calculated) : 43.2 Adjusted Body Weight: 102.5 kg  Vital Signs: Temp: 98.9 F (37.2 C) (07/15 1200) Temp Source: Oral (07/15 1200) BP: 137/59 mmHg (07/15 1400) Pulse Rate: 70 (07/15 1430) Intake/Output from previous day: 07/14 0701 - 07/15 0700 In: 840 [P.O.:840] Out: 1050 [Urine:1050] Intake/Output from this shift: Total I/O In: 600 [P.O.:600] Out: 425 [Urine:425]  Labs:  Recent Labs  09/19/14 0621 09/20/14 0422  CREATININE 0.69 0.64   Estimated Creatinine Clearance: 104.4 mL/min (by C-G formula based on Cr of 0.64). No results for input(s): VANCOTROUGH, VANCOPEAK, VANCORANDOM, GENTTROUGH, GENTPEAK, GENTRANDOM, TOBRATROUGH, TOBRAPEAK, TOBRARND, AMIKACINPEAK, AMIKACINTROU, AMIKACIN in the last 72 hours.   Microbiology: Recent Results (from the past 720 hour(s))  MRSA PCR Screening     Status: None   Collection Time: 09/14/14  7:50 PM  Result Value Ref Range Status   MRSA by PCR NEGATIVE NEGATIVE Final    Comment:        The GeneXpert MRSA Assay (FDA approved for NASAL specimens only), is one component of a comprehensive MRSA colonization surveillance program. It is not intended to diagnose MRSA infection nor to guide or monitor treatment for MRSA infections.   Culture, expectorated sputum-assessment     Status: None   Collection Time: 09/18/14  6:15 PM  Result Value Ref Range Status   Specimen Description SPUTUM  Final   Special Requests NONE  Final   Sputum evaluation THIS SPECIMEN IS ACCEPTABLE FOR SPUTUM CULTURE  Final   Report Status 09/19/2014 FINAL   Final  Culture, respiratory (NON-Expectorated)     Status: None   Collection Time: 09/18/14  6:15 PM  Result Value Ref Range Status   Specimen Description SPUTUM  Final   Special Requests NONE Reflexed from [id number]  Final   Gram Stain   Final    FAIR SPECIMEN - 70-80% WBCS FEW WBC SEEN RARE GRAM POSITIVE COCCI IN CLUSTERS    Culture   Final    HEAVY GROWTH METHICILLIN RESISTANT STAPHYLOCOCCUS AUREUS CRITICAL RESULT CALLED TO, READ BACK BY AND VERIFIED WITH: PAM MEYERS AT 1218 09/21/14 DV    Report Status 09/21/2014 FINAL  Final   Organism ID, Bacteria METHICILLIN RESISTANT STAPHYLOCOCCUS AUREUS  Final      Susceptibility   Methicillin resistant staphylococcus aureus - MIC*    CIPROFLOXACIN 2 INTERMEDIATE Intermediate     GENTAMICIN <=0.5 SENSITIVE Sensitive     OXACILLIN >=4 RESISTANT Resistant     VANCOMYCIN 1 SENSITIVE Sensitive     TRIMETH/SULFA <=10 SENSITIVE Sensitive     CEFOXITIN SCREEN Value in next row Resistant      POSITIVECEFOXITIN SCREEN - This test may be used to predict mecA-mediated oxacillin resistance, and it is based on the cefoxitin disk screen test.  The cefoxitin screen and oxacillin work in combination to determine the final interpretation reported for oxacillin.     Inducible Clindamycin Value in next row Resistant      POSITIVEINDUCIBLE CLINDAMYCIN RESISTANCE - A positive ICR test is indicative of inducible resistance to macrolides, lincosamides, and type B streptogramin.  This isolate is presumed to be  resistant to Clindamycin, however, Clindamycin may still be effective in some patients.     TETRACYCLINE Value in next row Sensitive      SENSITIVE<=1    * HEAVY GROWTH METHICILLIN RESISTANT STAPHYLOCOCCUS AUREUS    Medical History: Past Medical History  Diagnosis Date  . COPD (chronic obstructive pulmonary disease)   . Arthritis     Medications:  Anti-infectives    Start     Dose/Rate Route Frequency Ordered Stop   09/21/14 1500  vancomycin  (VANCOCIN) 1,500 mg in sodium chloride 0.9 % 500 mL IVPB     1,500 mg 250 mL/hr over 120 Minutes Intravenous Every 12 hours 09/21/14 1426     09/17/14 1200  azithromycin (ZITHROMAX) 500 mg in dextrose 5 % 250 mL IVPB  Status:  Discontinued     500 mg 250 mL/hr over 60 Minutes Intravenous Every 24 hours 09/17/14 1113 09/18/14 1154   09/15/14 1800  azithromycin (ZITHROMAX) 500 mg in dextrose 5 % 250 mL IVPB  Status:  Discontinued     500 mg 250 mL/hr over 60 Minutes Intravenous Every 24 hours 09/14/14 0345 09/15/14 1311   09/15/14 1800  azithromycin (ZITHROMAX) tablet 500 mg  Status:  Discontinued     500 mg Oral Daily 09/15/14 1311 09/17/14 1113   09/14/14 0145  azithromycin (ZITHROMAX) 500 mg in dextrose 5 % 250 mL IVPB     500 mg 250 mL/hr over 60 Minutes Intravenous  Once 09/14/14 0133 09/14/14 0302     Assessment: Patient with continued SOB at rest. Empirically adding Vancomycin for MRSA coverage.  Goal of Therapy:  Vancomycin trough level 15-20 mcg/ml  Plan:  Measure antibiotic drug levels at steady state Follow up culture results Will order Vancomycin  1500mg  IV q12h. Will check a trough level prior to 5th dose.   Will need to watch for accumulation in this patient due to obesity.  Clovia CuffLisa Nevia Henkin, PharmD, BCPS 09/21/2014 3:43 PM

## 2014-09-22 LAB — BLOOD GAS, ARTERIAL
ACID-BASE EXCESS: 22.7 mmol/L — AB (ref 0.0–3.0)
ACID-BASE EXCESS: 21.5 mmol/L — AB (ref 0.0–3.0)
ALLENS TEST (PASS/FAIL): POSITIVE — AB
BICARBONATE: 50 meq/L — AB (ref 21.0–28.0)
Bicarbonate: 51.2 mEq/L — ABNORMAL HIGH (ref 21.0–28.0)
Delivery systems: POSITIVE
EXPIRATORY PAP: 10
FIO2: 0.5 %
FIO2: 0.55 %
Inspiratory PAP: 16
O2 Saturation: 90.8 %
O2 Saturation: 92.4 %
PATIENT TEMPERATURE: 37
PATIENT TEMPERATURE: 37
PCO2 ART: 72 mmHg — AB (ref 32.0–48.0)
PO2 ART: 62 mmHg — AB (ref 83.0–108.0)
pCO2 arterial: 72 mmHg (ref 32.0–48.0)
pH, Arterial: 7.46 — ABNORMAL HIGH (ref 7.350–7.450)
pH, Arterial: 7.45 (ref 7.350–7.450)
pO2, Arterial: 57 mmHg — ABNORMAL LOW (ref 83.0–108.0)

## 2014-09-22 LAB — CBC
HCT: 30.6 % — ABNORMAL LOW (ref 35.0–47.0)
Hemoglobin: 9.9 g/dL — ABNORMAL LOW (ref 12.0–16.0)
MCH: 29.3 pg (ref 26.0–34.0)
MCHC: 32.3 g/dL (ref 32.0–36.0)
MCV: 90.6 fL (ref 80.0–100.0)
Platelets: 66 10*3/uL — ABNORMAL LOW (ref 150–440)
RBC: 3.38 MIL/uL — ABNORMAL LOW (ref 3.80–5.20)
RDW: 15.5 % — AB (ref 11.5–14.5)
WBC: 7.3 10*3/uL (ref 3.6–11.0)

## 2014-09-22 LAB — BASIC METABOLIC PANEL
ANION GAP: 6 (ref 5–15)
BUN: 25 mg/dL — ABNORMAL HIGH (ref 6–20)
CALCIUM: 7.7 mg/dL — AB (ref 8.9–10.3)
CO2: 44 mmol/L — AB (ref 22–32)
Chloride: 85 mmol/L — ABNORMAL LOW (ref 101–111)
Creatinine, Ser: 0.66 mg/dL (ref 0.44–1.00)
GFR calc Af Amer: >60 mL/min (ref >60)
GFR calc non Af Amer: >60 mL/min (ref >60)
Glucose, Bld: 272 mg/dL — ABNORMAL HIGH (ref 65–99)
Potassium: 4.6 mmol/L (ref 3.5–5.1)
SODIUM: 135 mmol/L (ref 135–145)

## 2014-09-22 LAB — GLUCOSE, CAPILLARY
GLUCOSE-CAPILLARY: 220 mg/dL — AB (ref 65–99)
Glucose-Capillary: 263 mg/dL — ABNORMAL HIGH (ref 65–99)
Glucose-Capillary: 245 mg/dL — ABNORMAL HIGH (ref 65–99)
Glucose-Capillary: 194 mg/dL — ABNORMAL HIGH (ref 65–99)

## 2014-09-22 MED ORDER — DEXTROMETHORPHAN POLISTIREX ER 30 MG/5ML PO SUER
30.0000 mg | Freq: Two times a day (BID) | ORAL | Status: DC
  Filled 2014-09-22 (×3): qty 5

## 2014-09-22 MED ORDER — DEXTROMETHORPHAN POLISTIREX ER 30 MG/5ML PO SUER
30.0000 mg | Freq: Two times a day (BID) | ORAL | Status: DC
  Administered 2014-09-23 – 2014-09-27 (×9): 30 mg via ORAL
  Filled 2014-09-22 (×23): qty 5

## 2014-09-22 MED ORDER — THEOPHYLLINE ER 100 MG PO TB12
200.0000 mg | ORAL_TABLET | Freq: Two times a day (BID) | ORAL | Status: DC
  Administered 2014-09-23 – 2014-09-24 (×3): 200 mg via ORAL
  Administered 2014-09-24: 100 mg via ORAL
  Administered 2014-09-25 – 2014-10-03 (×17): 200 mg via ORAL
  Filled 2014-09-22 (×7): qty 2
  Filled 2014-09-22: qty 1
  Filled 2014-09-22 (×3): qty 2
  Filled 2014-09-22: qty 1
  Filled 2014-09-22 (×2): qty 2
  Filled 2014-09-22: qty 1
  Filled 2014-09-22 (×2): qty 2
  Filled 2014-09-22: qty 1
  Filled 2014-09-22: qty 2
  Filled 2014-09-22 (×3): qty 1
  Filled 2014-09-22 (×3): qty 2

## 2014-09-22 NOTE — Progress Notes (Signed)
Slept well throughout the night.  Bipap @ 55%, sats 91-95%.  Afib on cardiac monitor.  Urine output 950 for this shift.

## 2014-09-22 NOTE — Progress Notes (Signed)
ANTIBIOTIC CONSULT NOTE - FOLLOW UP   Pharmacy Consult for Vancomycin Indication: rule out pneumonia  Allergies  Allergen Reactions  . Other Other (See Comments)    darvocet--pt reports blistering with this medication  . Penicillins Nausea And Vomiting  . Darvon [Propoxyphene] Other (See Comments)    Blisters    Patient Measurements: Height:  (149.9 cm) Weight: (!) 414 lb 7.4 oz (188 kg) IBW/kg (Calculated) : 43.2 Adjusted Body Weight: 102.5 kg  Vital Signs: Temp: 97 F (36.1 C) (07/16 0700) Temp Source: Axillary (07/16 0700) BP: 103/48 mmHg (07/16 0600) Pulse Rate: 54 (07/16 0700) Intake/Output from previous day: 07/15 0701 - 07/16 0700 In: 1600 [P.O.:600; IV Piggyback:1000] Out: 1525 [Urine:1525] Intake/Output from this shift:    Labs:  Recent Labs  09/20/14 0422 09/22/14 0617  CREATININE 0.64 0.66   Estimated Creatinine Clearance: 104.4 mL/min (by C-G formula based on Cr of 0.66). No results for input(s): VANCOTROUGH, VANCOPEAK, VANCORANDOM, GENTTROUGH, GENTPEAK, GENTRANDOM, TOBRATROUGH, TOBRAPEAK, TOBRARND, AMIKACINPEAK, AMIKACINTROU, AMIKACIN in the last 72 hours.   Microbiology: Recent Results (from the past 720 hour(s))  MRSA PCR Screening     Status: None   Collection Time: 09/14/14  7:50 PM  Result Value Ref Range Status   MRSA by PCR NEGATIVE NEGATIVE Final    Comment:        The GeneXpert MRSA Assay (FDA approved for NASAL specimens only), is one component of a comprehensive MRSA colonization surveillance program. It is not intended to diagnose MRSA infection nor to guide or monitor treatment for MRSA infections.   Culture, expectorated sputum-assessment     Status: None   Collection Time: 09/18/14  6:15 PM  Result Value Ref Range Status   Specimen Description SPUTUM  Final   Special Requests NONE  Final   Sputum evaluation THIS SPECIMEN IS ACCEPTABLE FOR SPUTUM CULTURE  Final   Report Status 09/19/2014 FINAL  Final  Culture,  respiratory (NON-Expectorated)     Status: None   Collection Time: 09/18/14  6:15 PM  Result Value Ref Range Status   Specimen Description SPUTUM  Final   Special Requests NONE Reflexed from [id number]  Final   Gram Stain   Final    FAIR SPECIMEN - 70-80% WBCS FEW WBC SEEN RARE GRAM POSITIVE COCCI IN CLUSTERS    Culture   Final    HEAVY GROWTH METHICILLIN RESISTANT STAPHYLOCOCCUS AUREUS CRITICAL RESULT CALLED TO, READ BACK BY AND VERIFIED WITH: PAM MEYERS AT 1218 09/21/14 DV    Report Status 09/21/2014 FINAL  Final   Organism ID, Bacteria METHICILLIN RESISTANT STAPHYLOCOCCUS AUREUS  Final      Susceptibility   Methicillin resistant staphylococcus aureus - MIC*    CIPROFLOXACIN 2 INTERMEDIATE Intermediate     GENTAMICIN <=0.5 SENSITIVE Sensitive     OXACILLIN >=4 RESISTANT Resistant     VANCOMYCIN 1 SENSITIVE Sensitive     TRIMETH/SULFA <=10 SENSITIVE Sensitive     CEFOXITIN SCREEN Value in next row Resistant      POSITIVECEFOXITIN SCREEN - This test may be used to predict mecA-mediated oxacillin resistance, and it is based on the cefoxitin disk screen test.  The cefoxitin screen and oxacillin work in combination to determine the final interpretation reported for oxacillin.     Inducible Clindamycin Value in next row Resistant      POSITIVEINDUCIBLE CLINDAMYCIN RESISTANCE - A positive ICR test is indicative of inducible resistance to macrolides, lincosamides, and type B streptogramin.  This isolate is presumed to be resistant to  Clindamycin, however, Clindamycin may still be effective in some patients.     TETRACYCLINE Value in next row Sensitive      SENSITIVE<=1    * HEAVY GROWTH METHICILLIN RESISTANT STAPHYLOCOCCUS AUREUS    Medical History: Past Medical History  Diagnosis Date  . COPD (chronic obstructive pulmonary disease)   . Arthritis     Medications:  Anti-infectives    Start     Dose/Rate Route Frequency Ordered Stop   09/21/14 1500  vancomycin (VANCOCIN) 1,500 mg in  sodium chloride 0.9 % 500 mL IVPB     1,500 mg 250 mL/hr over 120 Minutes Intravenous Every 12 hours 09/21/14 1426     09/17/14 1200  azithromycin (ZITHROMAX) 500 mg in dextrose 5 % 250 mL IVPB  Status:  Discontinued     500 mg 250 mL/hr over 60 Minutes Intravenous Every 24 hours 09/17/14 1113 09/18/14 1154   09/15/14 1800  azithromycin (ZITHROMAX) 500 mg in dextrose 5 % 250 mL IVPB  Status:  Discontinued     500 mg 250 mL/hr over 60 Minutes Intravenous Every 24 hours 09/14/14 0345 09/15/14 1311   09/15/14 1800  azithromycin (ZITHROMAX) tablet 500 mg  Status:  Discontinued     500 mg Oral Daily 09/15/14 1311 09/17/14 1113   09/14/14 0145  azithromycin (ZITHROMAX) 500 mg in dextrose 5 % 250 mL IVPB     500 mg 250 mL/hr over 60 Minutes Intravenous  Once 09/14/14 0133 09/14/14 0302     Assessment: Patient with continued SOB at rest. Empirically adding Vancomycin for MRSA coverage.  Goal of Therapy:  Vancomycin trough level 15-20 mcg/ml  Plan:  Measure antibiotic drug levels at steady state Follow up culture results Will order Vancomycin  1500mg  IV q12h. Will check a trough level prior to 5th dose.   Will need to watch for accumulation in this patient due to obesity.   Demetrius Charityeldrin D. Marelyn Rouser, PharmD  09/22/2014 8:25 AM

## 2014-09-22 NOTE — Progress Notes (Signed)
Date: 09/22/2014,   MRN# [id number] Eileen MorrowShirley A [name] 03-01-1946 Code Status:     Code Status Orders        Start     Ordered   09/14/14 1323  Do not attempt resuscitation (DNR)   Continuous    Question Answer Comment  In the event of cardiac or respiratory ARREST Do not call a "code blue"   In the event of cardiac or respiratory ARREST Do not perform Intubation, CPR, defibrillation or ACLS   In the event of cardiac or respiratory ARREST Use medication by any route, position, wound care, and other measures to relive pain and suffering. May use oxygen, suction and manual treatment of airway obstruction as needed for comfort.      09/14/14 1322    Advance Directive Documentation        Most Recent Value   Type of Advance Directive  Healthcare Power of Attorney, Living will   Pre-existing out of facility DNR order (yellow form or pink MOST form)     "MOST" Form in Place?       Hosp day:@LENGTHOFSTAYDAYS @ Referring MD: @ATDPROV @         HPI: somnulent, obese, on bipap, arousable, hypoxia/hypercapnea resp failure. W/U in progres  PMHX:   Past Medical History  Diagnosis Date  . COPD (chronic obstructive pulmonary disease)   . Arthritis    Surgical Hx:  Past Surgical History  Procedure Laterality Date  . Laminectomy    . Back surgery     Family Hx:  Family History  Problem Relation Age of Onset  . CAD Mother   . Diabetes type II Mother   . Hypertension Father   . CAD Father    Social Hx:   History  Substance Use Topics  . Smoking status: Never Smoker   . Smokeless tobacco: Never Used  . Alcohol Use: No   Medication:    Home Medication:  No current outpatient prescriptions on file.  Current Medication: @CURMEDTAB @   Allergies:  Other; Penicillins; and Darvon  Review of Systems: Gen:  Denies  fever, sweats, chills HEENT: Denies blurred vision, double vision, ear pain, eye pain, hearing loss, nose bleeds, sore throat Cvc:  No dizziness, chest pain or  heaviness Resp:    Gi: Denies swallowing difficulty, stomach pain, nausea or vomiting, diarrhea, constipation, bowel incontinence Gu:  Denies bladder incontinence, burning urine Ext:   No Joint pain, stiffness or swelling Skin: No skin rash, easy bruising or bleeding or hives Endoc:  No polyuria, polydipsia , polyphagia or weight change Psych: No depression, insomnia or hallucinations  Other:  All other systems negative  Physical Examination:   VS: BP 138/78 mmHg  Pulse 72  Temp(Src) 96.3 F (35.7 C) (Axillary)  Resp 15  Ht 4\' 11"  (1.499 m)  Wt 414 lb 7.4 oz (188 kg)  BMI 83.67 kg/m2  SpO2 93%  General Appearance: No distress  Neuro: without focal findings, mental status, speech normal, alert and oriented, cranial nerves 2-12 intact, reflexes normal and symmetric, sensation grossly normal  HEENT: PERRLA, EOM intact, no ptosis, no other lesions noticed, Mallampati: Pulmonary:.No wheezing, No rales  Sputum Production:   Cardiovascular:  Normal S1,S2.  No m/r/g.  Abdominal aorta pulsation normal.    Abdomen:Benign, Soft, non-tender, No masses, hepatosplenomegaly, No lymphadenopathy Endoc: No evident thyromegaly, no signs of acromegaly or Cushing features Skin:   warm, no rashes, no ecchymosis  Extremities: normal, no cyanosis, clubbing, no edema, warm with normal capillary refill. Other findings:  Labs results:   Recent Labs     09/20/14  0422  09/22/14  0617  HGB   --   9.9*  HCT   --   30.6*  MCV   --   90.6  WBC   --   7.3  BUN  26*  25*  CREATININE  0.64  0.66  GLUCOSE  186*  272*  CALCIUM  8.0*  7.7*  ,   FIO2 % 0.55 0.50 80.00 0.80     Delivery systems  BILEVEL POSITIVE AIRW...  BILEVEL POSITIVE AIRW... BILEVEL POSITIVE AIRW...   BILEVEL POSITIVE AIRWAY PRESSURE    Inspiratory PAP  Expiratory PAP  pH, Arterial 7.350 - 7.450  7.45 7.46 (H)VC 7.50 (H) 7.48 (H)    pCO2 arterial 32.0 - 48.0 mmHg 7272 (HH) 72 (HH)VC, CM 88 (HH)CM 93  (HH)CM   Comments: CRITICAL RESULT CALLED TO, READ BACK BY AND VERIFIED WITH:  CRTICAL VALUE CALLED TO DR. Domonic Kimball AT 1103 ON [id number]      pO2, Arterial 83.0 - 108.0 mmHg 62 (L) 57 (L)VC 72 (L) 69 (L)    Bicarbonate 21.0 - 28.0 mEq/L 50.0 (H) 51.2 (H)VC 68.6 (H) 69.3 (H)    Acid-Base Excess 0.0 - 3.0 mmol/L 21.5 (H) 22.7 (H)VC 37.7 (H) 37.8 (H)    O2 Saturation % 92.4 90.8VC 95.5 94.7    Patient temperature  37.0 37.0VC 37.0 37.0    Collection site  LEFT RADIAL RIGHT RADIAL RIGHT RADIAL RIGHT RADIAL    Sample type  ARTERIAL DRAW ARTERIAL DRAW ARTERIAL DRAW ARTERIAL DRAW    Allens test (pass/fail) PASS  POSITIVE (A) PASS POSITIVE (A) POSITIVE (A)   Resulting Agency  [facility]      Specimen Collected: 09/22/14 10:55 AM Last Resulted: 09/22/14 11:04 AM             VC=Value has a corrected status CM=Additional comments           Assessment and Plan:  Acute on chronic hypoxic/hypercapneic respiratory failure, on bipap, obese, mrsa pneumonia, on vanco. meds reviewed. Dependant on bipap.  -treat copd as you doing -avoid sedation -check tsh, free t4, cpk -add theodur to help in dept of tidal volume and increased min ventilation -? Add provera 10 mg q day, ,on hold for now - ??? Trach and vent over night is another possible intervention  I have personally obtained a history, examined the patient, evaluated laboratory and imaging results, formulated the assessment and plan and placed orders.  The Patient requires high complexity decision making for assessment and support, frequent evaluation and titration of therapies, application of advanced monitoring technologies and extensive interpretation of multiple databases.   Emilyann Banka,M.D. Pulmonary & Critical care Medicine [facility]

## 2014-09-22 NOTE — Progress Notes (Signed)
PHARMACY - CRITICAL CARE PROGRESS NOTE  Pharmacy Consult for Electrolyte Replacement Indication: ICU patient   Allergies  Allergen Reactions  . Other Other (See Comments)    darvocet--pt reports blistering with this medication  . Penicillins Nausea And Vomiting  . Darvon [Propoxyphene] Other (See Comments)    Blisters    Patient Measurements: Height: 4\' 11"  (149.9 cm) Weight: (!) 414 lb 7.4 oz (188 kg) IBW/kg (Calculated) : 43.2   Vital Signs: Temp: 97 F (36.1 C) (07/16 0700) Temp Source: Axillary (07/16 0700) BP: 103/48 mmHg (07/16 0600) Pulse Rate: 54 (07/16 0700) Intake/Output from previous day: 07/15 0701 - 07/16 0700 In: 1600 [P.O.:600; IV Piggyback:1000] Out: 1525 [Urine:1525] Intake/Output from this shift:   Vent settings for last 24 hours: Vent Mode:  [-]  FiO2 (%):  [50 %-60 %] 55 %  Labs:  Recent Labs  09/20/14 0422 09/22/14 0617  CREATININE 0.64 0.66  MG 2.0  --    Estimated Creatinine Clearance: 104.4 mL/min (by C-G formula based on Cr of 0.66).   Recent Labs  09/21/14 1710 09/21/14 2139 09/22/14 0734  GLUCAP 304* 276* 263*    Microbiology: Recent Results (from the past 720 hour(s))  MRSA PCR Screening     Status: None   Collection Time: 09/14/14  7:50 PM  Result Value Ref Range Status   MRSA by PCR NEGATIVE NEGATIVE Final    Comment:        The GeneXpert MRSA Assay (FDA approved for NASAL specimens only), is one component of a comprehensive MRSA colonization surveillance program. It is not intended to diagnose MRSA infection nor to guide or monitor treatment for MRSA infections.   Culture, expectorated sputum-assessment     Status: None   Collection Time: 09/18/14  6:15 PM  Result Value Ref Range Status   Specimen Description SPUTUM  Final   Special Requests NONE  Final   Sputum evaluation THIS SPECIMEN IS ACCEPTABLE FOR SPUTUM CULTURE  Final   Report Status 09/19/2014 FINAL  Final  Culture, respiratory (NON-Expectorated)      Status: None   Collection Time: 09/18/14  6:15 PM  Result Value Ref Range Status   Specimen Description SPUTUM  Final   Special Requests NONE Reflexed from [id number]  Final   Gram Stain   Final    FAIR SPECIMEN - 70-80% WBCS FEW WBC SEEN RARE GRAM POSITIVE COCCI IN CLUSTERS    Culture   Final    HEAVY GROWTH METHICILLIN RESISTANT STAPHYLOCOCCUS AUREUS CRITICAL RESULT CALLED TO, READ BACK BY AND VERIFIED WITH: PAM MEYERS AT 1218 09/21/14 DV    Report Status 09/21/2014 FINAL  Final   Organism ID, Bacteria METHICILLIN RESISTANT STAPHYLOCOCCUS AUREUS  Final      Susceptibility   Methicillin resistant staphylococcus aureus - MIC*    CIPROFLOXACIN 2 INTERMEDIATE Intermediate     GENTAMICIN <=0.5 SENSITIVE Sensitive     OXACILLIN >=4 RESISTANT Resistant     VANCOMYCIN 1 SENSITIVE Sensitive     TRIMETH/SULFA <=10 SENSITIVE Sensitive     CEFOXITIN SCREEN Value in next row Resistant      POSITIVECEFOXITIN SCREEN - This test may be used to predict mecA-mediated oxacillin resistance, and it is based on the cefoxitin disk screen test.  The cefoxitin screen and oxacillin work in combination to determine the final interpretation reported for oxacillin.     Inducible Clindamycin Value in next row Resistant      POSITIVEINDUCIBLE CLINDAMYCIN RESISTANCE - A positive ICR test is indicative of inducible  resistance to macrolides, lincosamides, and type B streptogramin.  This isolate is presumed to be resistant to Clindamycin, however, Clindamycin may still be effective in some patients.     TETRACYCLINE Value in next row Sensitive      SENSITIVE<=1    * HEAVY GROWTH METHICILLIN RESISTANT STAPHYLOCOCCUS AUREUS    Medications:  Scheduled:  . antiseptic oral rinse  7 mL Mouth Rinse BID  . apixaban  5 mg Oral BID  . budesonide (PULMICORT) nebulizer solution  0.5 mg Nebulization BID  . dextromethorphan  30 mg Oral BID  . diltiazem  30 mg Oral 4 times per day  . docusate sodium  200 mg Oral BID  .  guaiFENesin  600 mg Oral BID  . insulin aspart  0-20 Units Subcutaneous TID WC  . insulin aspart  0-5 Units Subcutaneous QHS  . ipratropium-albuterol  3 mL Nebulization Q4H  . methylPREDNISolone (SOLU-MEDROL) injection  60 mg Intravenous Q6H  . sertraline  100 mg Oral Daily  . tiotropium  18 mcg Inhalation Daily  . traZODone  100 mg Oral QHS  . vancomycin  1,500 mg Intravenous Q12H   Infusions:    Assessment: All electrolytes are within normal limits.   Plan:  Will follow up on am labs.  Jaysun Wessels D 09/22/2014,8:23 AM

## 2014-09-22 NOTE — Progress Notes (Signed)
[facility] -  at [facility]   PATIENT NAME: Eileen [name]    MR#:  [id number]  DATE OF BIRTH:  11/14/1945  SUBJECTIVE:  CHIEF COMPLAINT:   Chief Complaint  Patient presents with  . Shortness of Breath   She is here due to acute on chronic hypoxic hypercarbic respiratory failure. Remains on BiPAP, lethargic but follows simple commands. ABG this a.m. unchanged from previous.  REVIEW OF SYSTEMS:   Review of Systems  Unable to perform ROS  patient is lethargic and mildly encephalopathic.  DRUG ALLERGIES:   Allergies  Allergen Reactions  . Other Other (See Comments)    darvocet--pt reports blistering with this medication  . Penicillins Nausea And Vomiting  . Darvon [Propoxyphene] Other (See Comments)    Blisters    VITALS:  Blood pressure 122/56, pulse 56, temperature 97.8 F (36.6 C), temperature source Axillary, resp. rate 15, height  (1.499 m), weight 188 kg (414 lb 7.4 oz), SpO2 93 %.  PHYSICAL EXAMINATION:  GENERAL:  69 y.o.-year-old patient lying in the bed encephalopathic, lethargic. On BiPAP.  EYES: Pupils equal, round, reactive to light and accommodation. No scleral icterus. Extraocular muscles intact.  HEENT: Head atraumatic, normocephalic. Oropharynx and nasopharynx clear.  NECK:  Supple, no jugular venous distention. No thyroid enlargement, no tenderness.  LUNGS: Poor respiratory effort given her encephalopathy. Negative use of accessory muscles. No rales or rhonchi noted wheezing anteriorly. CARDIOVASCULAR: Distant S1, S2 normal. No murmurs, rubs, or gallops.  ABDOMEN: Soft, nontender, nondistended. Bowel sounds present. No organomegaly or mass.  EXTREMITIES: 1+ pedal edema, No cyanosis, or clubbing.  NEUROLOGIC: Cranial nerves II through XII are intact. No focal motor or sensory deficits bilaterally. Globally weak.  PSYCHIATRIC: The patient is alert and oriented x 1. Encephalopathic SKIN: No obvious rash, lesion, or  ulcer.    LABORATORY PANEL:   CBC  Recent Labs Lab 09/22/14 0617  WBC 7.3  HGB 9.9*  HCT 30.6*  PLT 66*   ------------------------------------------------------------------------------------------------------------------  Chemistries   Recent Labs Lab 09/20/14 0422 09/22/14 0617  NA 138 135  K 4.2 4.6  CL 79* 85*  CO2 >50* 44*  GLUCOSE 186* 272*  BUN 26* 25*  CREATININE 0.64 0.66  CALCIUM 8.0* 7.7*  MG 2.0  --    ------------------------------------------------------------------------------------------------------------------  Cardiac Enzymes No results for input(s): TROPONINI in the last 168 hours. ------------------------------------------------------------------------------------------------------------------  RADIOLOGY:  No results found.   ASSESSMENT AND PLAN:   69 year old female with past medical history of morbid obesity, obstructive sleep apnea, COPD, osteoarthritis, obesity pickwickian syndrome, chronic atrial fibrillation who presented to the hospital due to shortness of breath and noted to be in acute on chronic respiratory failure.  #1 acute on chronic hypoxic hypercarbic respiratory failure-this is likely secondary to patient's obesity pickwickian syndrome complicated with sleep apnea and COPD exacerbation. -Patient also has a MRSA pneumonia. -Continue IV steroids, DuoNeb nebs around-the-clock, Spiriva, and empiric antibiotics with vancomycin. -Continue BiPAP support follow serial ABGs. Pulmonary following and agrees with management.  #2 COPD exacerbation-likely secondary to underlying pneumonia. -Continue IV steroids, DuoNeb's, Spiriva, Pulmicort vancomycin. -Continue BiPAP support.  #3 chronic afibrillation-rate controlled and heart rate on the bradycardic side.  -Continue Eliquis  #4 MRSA pneumonia-continue vancomycin.  #5 depression-continue Zoloft.  #6 morbid obesity with obesity pickwickian syndrome-continue BiPAP support.  #7  thrombocytopenia-seems chronic for the patient. Etiology unclear. -No acute bleeding, we'll avoid heparin products. Follow-up with hematology as outpatient.  Patient would likely be a good candidate for long-term  acute care facility and will need to discuss with social work/case management  All the records are reviewed and case discussed with Care Management/Social Workerr. Management plans discussed with the patient, family and they are in agreement.  CODE STATUS: DO NOT RESUSCITATE  TOTAL Critical Care TIME TAKING CARE OF THIS PATIENT: 30 minutes.   Disposition depends on patient's clinical course. Likely needs to be evaluated for long-term acute care facility.  Houston SirenSAINANI,VIVEK J M.D on 09/22/2014 at 1:15 PM  Between 7am to 6pm - Pager - [phone]  After 6pm go to [email or web address] - password [email or web address]  Fabio Neighborsagle [facility] [place] [facility]  Office  [phone]  CC: Primary care physician; Pcp Not In System

## 2014-09-23 LAB — CBC
HEMATOCRIT: 32.1 % — AB (ref 35.0–47.0)
HEMOGLOBIN: 10.4 g/dL — AB (ref 12.0–16.0)
MCH: 29.2 pg (ref 26.0–34.0)
MCHC: 32.3 g/dL (ref 32.0–36.0)
MCV: 90.4 fL (ref 80.0–100.0)
Platelets: 81 10*3/uL — ABNORMAL LOW (ref 150–440)
RBC: 3.55 MIL/uL — AB (ref 3.80–5.20)
RDW: 15.6 % — AB (ref 11.5–14.5)
WBC: 6.4 10*3/uL (ref 3.6–11.0)

## 2014-09-23 LAB — MAGNESIUM: MAGNESIUM: 1.9 mg/dL (ref 1.7–2.4)

## 2014-09-23 LAB — BASIC METABOLIC PANEL
ANION GAP: 7 (ref 5–15)
BUN: 31 mg/dL — AB (ref 6–20)
CO2: 40 mmol/L — ABNORMAL HIGH (ref 22–32)
Calcium: 7.9 mg/dL — ABNORMAL LOW (ref 8.9–10.3)
Chloride: 89 mmol/L — ABNORMAL LOW (ref 101–111)
Creatinine, Ser: 0.61 mg/dL (ref 0.44–1.00)
GFR calc non Af Amer: >60 mL/min (ref >60)
Glucose, Bld: 231 mg/dL — ABNORMAL HIGH (ref 65–99)
Potassium: 4.6 mmol/L (ref 3.5–5.1)
Sodium: 136 mmol/L (ref 135–145)

## 2014-09-23 LAB — CK TOTAL AND CKMB (NOT AT ARMC)
CK TOTAL: 7 U/L — AB (ref 38–234)
CK, MB: 0.6 ng/mL (ref 0.5–5.0)
Relative Index: INVALID (ref 0.0–2.5)

## 2014-09-23 LAB — VANCOMYCIN, TROUGH: Vancomycin Tr: 27 ug/mL (ref 10–20)

## 2014-09-23 LAB — GLUCOSE, CAPILLARY
GLUCOSE-CAPILLARY: 236 mg/dL — AB (ref 65–99)
GLUCOSE-CAPILLARY: 253 mg/dL — AB (ref 65–99)
Glucose-Capillary: 271 mg/dL — ABNORMAL HIGH (ref 65–99)
Glucose-Capillary: 256 mg/dL — ABNORMAL HIGH (ref 65–99)

## 2014-09-23 LAB — PHOSPHORUS: PHOSPHORUS: 3.8 mg/dL (ref 2.5–4.6)

## 2014-09-23 MED ORDER — VANCOMYCIN HCL 10 G IV SOLR
1500.0000 mg | INTRAVENOUS | Status: DC
  Filled 2014-09-23: qty 1500

## 2014-09-23 MED ORDER — VANCOMYCIN HCL 10 G IV SOLR
1500.0000 mg | INTRAVENOUS | Status: DC
  Filled 2014-09-23 (×2): qty 1500

## 2014-09-23 MED ORDER — VANCOMYCIN HCL 10 G IV SOLR
1500.0000 mg | INTRAVENOUS | Status: DC
  Administered 2014-09-24 – 2014-09-25 (×2): 1500 mg via INTRAVENOUS
  Filled 2014-09-23 (×6): qty 1500

## 2014-09-23 NOTE — Progress Notes (Signed)
[facility] - [place] at [facility]   PATIENT NAME: Eileen [name]    MR#:  [id number]  DATE OF BIRTH:  1946-01-17  SUBJECTIVE:  CHIEF COMPLAINT:   Chief Complaint  Patient presents with  . Shortness of Breath   She is here due to acute on chronic hypoxic hypercarbic respiratory failure. Off BiPAP this morning. Much more awake and alert. On high flow nasal cannula at 65%.  REVIEW OF SYSTEMS:   Review of Systems  Constitutional: Negative for fever and chills.  HENT: Negative for congestion and tinnitus.   Eyes: Negative for blurred vision and double vision.  Respiratory: Positive for shortness of breath (improved). Negative for cough and wheezing.   Cardiovascular: Negative for chest pain, orthopnea and PND.  Gastrointestinal: Negative for nausea, vomiting, abdominal pain and diarrhea.  Genitourinary: Negative for dysuria and hematuria.  Neurological: Negative for dizziness, sensory change and focal weakness.  All other systems reviewed and are negative.   DRUG ALLERGIES:   Allergies  Allergen Reactions  . Other Other (See Comments)    darvocet--pt reports blistering with this medication  . Penicillins Nausea And Vomiting  . Darvon [Propoxyphene] Other (See Comments)    Blisters    VITALS:  Blood pressure 111/49, pulse 90, temperature 98.1 F (36.7 C), temperature source Axillary, resp. rate 18, height 4\' 11"  (1.499 m), weight 180 kg (396 lb 13.3 oz), SpO2 93 %.  PHYSICAL EXAMINATION:  GENERAL:  69 y.o.-year-old patient lying in the bed in no apparent distress. EYES: Pupils equal, round, reactive to light and accommodation. No scleral icterus. Extraocular muscles intact.  HEENT: Head atraumatic, normocephalic. Oropharynx and nasopharynx clear.  NECK:  Supple, no jugular venous distention. No thyroid enlargement, no tenderness.  LUNGS: No dullness to percussion Negative use of accessory muscles. No rales or rhonchi noted wheezing  anteriorly. CARDIOVASCULAR: Distant S1, S2 normal. No murmurs, rubs, or gallops.  ABDOMEN: Soft, nontender, nondistended. Bowel sounds present. No organomegaly or mass.  EXTREMITIES: 1+ pedal edema, No cyanosis, or clubbing.  NEUROLOGIC: Cranial nerves II through XII are intact. No focal motor or sensory deficits bilaterally. Globally weak.  PSYCHIATRIC: The patient is alert and oriented x 3. Globally weak  SKIN: No obvious rash, lesion, or ulcer.   Foley catheter in place draining clear urine.    LABORATORY PANEL:   CBC  Recent Labs Lab 09/23/14 0644  WBC 6.4  HGB 10.4*  HCT 32.1*  PLT 81*   ------------------------------------------------------------------------------------------------------------------  Chemistries   Recent Labs Lab 09/23/14 0644  NA 136  K 4.6  CL 89*  CO2 40*  GLUCOSE 231*  BUN 31*  CREATININE 0.61  CALCIUM 7.9*  MG 1.9   ------------------------------------------------------------------------------------------------------------------  Cardiac Enzymes No results for input(s): TROPONINI in the last 168 hours. ------------------------------------------------------------------------------------------------------------------  RADIOLOGY:  No results found.   ASSESSMENT AND PLAN:   69 year old female with past medical history of morbid obesity, obstructive sleep apnea, COPD, osteoarthritis, obesity pickwickian syndrome, chronic atrial fibrillation who presented to the hospital due to shortness of breath and noted to be in acute on chronic respiratory failure.  #1 acute on chronic hypoxic hypercarbic respiratory failure-this is likely secondary to patient's obesity pickwickian syndrome complicated with sleep apnea and COPD exacerbation/MRSA pneumonia. -Continue IV steroids, DuoNeb nebs around-the-clock, Spiriva, and empiric antibiotics with vancomycin. -Much improved since yesterday and now on high flow nasal cannula. -Continue BiPAP with naps  and at night. Pulmonary following and agrees with management.  #2 COPD exacerbation-likely secondary to underlying pneumonia. -  Continue IV steroids, DuoNeb's, Spiriva, Pulmicort -Continue IV vancomycin for MRSA pneumonia. -Continue BiPAP support.  #3 chronic afibrillation-rate controlled and heart rate on the bradycardic side. Continue Cardizem  -Continue Eliquis  #4 MRSA pneumonia-continue vancomycin. -Can likely switch to oral Zyvox in a.m. if her mental status stay stable.  #5 depression-continue Zoloft.  #6 morbid obesity with obesity pickwickian syndrome-continue BiPAP support.  #7 thrombocytopenia-seems chronic for the patient. Etiology unclear. -No acute bleeding, we'll avoid heparin products. Follow-up with hematology as outpatient.  Patient would likely be a good candidate for long-term acute care facility and will need to discuss with social work/case management tomorrow morning.    All the records are reviewed and case discussed with Care Management/Social Workerr. Management plans discussed with the patient, family and they are in agreement.  CODE STATUS: DO NOT RESUSCITATE  TOTAL TIME TAKING CARE OF THIS PATIENT: 30 minutes.   Disposition depends on patient's clinical course. Likely needs to be evaluated for long-term acute care facility.  Houston Siren M.D on 09/23/2014 at 11:59 AM  Between 7am to 6pm - Pager - [phone]  After 6pm go to [email or web address] - password [email or web address]  [facility]  Office  [phone]  CC: Primary care physician; Pcp Not In System

## 2014-09-23 NOTE — Progress Notes (Signed)
Date: 09/23/2014,   MRN# [id number] Eileen [name] Eileen [name] [birth year] Code Status:     Code Status Orders        Start     Ordered   09/14/14 1323  Do not attempt resuscitation (DNR)   Continuous    Question Answer Comment  In the event of cardiac or respiratory ARREST Do not call Eileen "code blue"   In the event of cardiac or respiratory ARREST Do not perform Intubation, CPR, defibrillation or ACLS   In the event of cardiac or respiratory ARREST Use medication by any route, position, wound care, and other measures to relive pain and suffering. May use oxygen, suction and manual treatment of airway obstruction as needed for comfort.      09/14/14 1322    Advance Directive Documentation        Most Recent Value   Type of Advance Directive  Healthcare Power of Attorney, Living will   Pre-existing out of facility DNR order (yellow form or pink MOST form)     "MOST" Form in Place?       Hosp day:@LENGTHOFSTAYDAYS @ Referring MD: @ATDPROV @       HPI: Very awake today, tearful, globally weak, no resp distress. On HFNC  PMHX:   Past Medical History  Diagnosis Date  . COPD (chronic obstructive pulmonary disease)   . Arthritis    Surgical Hx:  Past Surgical History  Procedure Laterality Date  . Laminectomy    . Back surgery     Family Hx:  Family History  Problem Relation Age of Onset  . CAD Mother   . Diabetes type II Mother   . Hypertension Father   . CAD Father    Social Hx:   History  Substance Use Topics  . Smoking status: Never Smoker   . Smokeless tobacco: Never Used  . Alcohol Use: No   Medication:    Home Medication:  No current outpatient prescriptions on file.  Current Medication: @CURMEDTAB @   Allergies:  Other; Penicillins; and Darvon  Review of Systems: Gen:  Denies  fever, sweats, chills HEENT: Denies blurred vision, double vision, ear pain, eye pain, hearing loss, nose bleeds, sore throat Cvc:  No dizziness, chest pain or heaviness Resp:   Improved,  less  Sob, known hx of obst sleep apnea, intolerant to cpap Gi: Denies swallowing difficulty, stomach pain, nausea or vomiting, diarrhea, constipation, bowel incontinence Gu:  Denies bladder incontinence, burning urine Ext:   No Joint pain, stiffness or swelling Skin: No skin rash, easy bruising or bleeding or hives Endoc:  No polyuria, polydipsia , polyphagia or weight change Psych: No depression, insomnia or hallucinations  Other:  All other systems negative  Physical Examination:   VS: BP 111/49 mmHg  Pulse 90  Temp(Src) 98.1 F (36.7 C) (Axillary)  Resp 18  Ht 4\' 11"  (1.499 m)  Wt 396 lb 13.3 oz (180 kg)  BMI 80.11 kg/m2  SpO2 95%  General Appearance: No distress obese,HFNC at 68% Neuro/psych  mental status, speech normal, alert and oriented, cranial nerves 2-12 intact,  sensation grossly normal, globally weak  HEENT: PERRLA, EOM intact, no ptosis, no other lesions noticed NECK: Supple very short and thick  Pulmonary:.distant, No wheezing, No rales   Cardiovascular:  Normal S1,S2.  No m/r/g.   Abdomen:Benign, obese Soft, non-tender, No masses, hepatosplenomegaly, No lymphadenopathy Endoc: No evident thyromegaly, no signs of acromegaly or Cushing features Skin:   warm, no rashes, no ecchymosis  Extremities: normal, no cyanosis, clubbing,  edema, warm with normal capillary refill. Other findings:   Labs results:   Recent Labs     09/22/14  0617  09/23/14  0644  HGB  9.9*  10.4*  HCT  30.6*  32.1*  MCV  90.6  90.4  WBC  7.3  6.4  BUN  25*  31*  CREATININE  0.66  0.61  GLUCOSE  272*  231*  CALCIUM  7.7*  7.9*  ,  Phos 3.8. THYROID profile pending  Assessment and Plan: Acute on chronic hypoxic/hypercapneic respiratory failure, on bipap, obese, mrsa pneumonia, on vanco. meds reviewed. Dependant on bipap. Mentation much improved.   -treat copd as you doing -avoid sedation -theodur 200 mg po q 12,  to help in dept of tidal volume and increased min ventilation -LTAC  placement in progress    I have personally obtained Eileen history, examined the patient, evaluated laboratory and imaging results, formulated the assessment and plan and placed orders.  The Patient requires high complexity decision making for assessment and support, frequent evaluation and titration of therapies, application of advanced monitoring technologies and extensive interpretation of multiple databases.   Herbon Fleming,M.D. Pulmonary & Critical care Medicine [facility]

## 2014-09-23 NOTE — Progress Notes (Signed)
ANTIBIOTIC CONSULT NOTE - FOLLOW UP   Pharmacy Consult for Vancomycin Indication: rule out pneumonia  Allergies  Allergen Reactions  . Other Other (See Comments)    darvocet--pt reports blistering with this medication  . Penicillins Nausea And Vomiting  . Darvon [Propoxyphene] Other (See Comments)    Blisters    Patient Measurements: Height: 4\' 11"  (149.9 cm) Weight: (!) 396 lb 13.3 oz (180 kg) IBW/kg (Calculated) : 43.2 Adjusted Body Weight: 102.5 kg  Vital Signs: Temp: 98.4 F (36.9 C) (07/17 1400) Temp Source: Axillary (07/17 1400) BP: 133/68 mmHg (07/17 1455) Pulse Rate: 61 (07/17 1400) Intake/Output from previous day: 07/16 0701 - 07/17 0700 In: 1240 [P.O.:240; IV Piggyback:1000] Out: 1325 [Urine:1325] Intake/Output from this shift: Total I/O In: 1220 [P.O.:720; IV Piggyback:500] Out: 450 [Urine:450]  Labs:  Recent Labs  09/22/14 0617 09/23/14 0644  WBC 7.3 6.4  HGB 9.9* 10.4*  PLT 66* 81*  CREATININE 0.66 0.61   Estimated Creatinine Clearance: 101.1 mL/min (by C-G formula based on Cr of 0.61).  Recent Labs  09/23/14 1444  VANCOTROUGH 27*     Microbiology: Recent Results (from the past 720 hour(s))  MRSA PCR Screening     Status: None   Collection Time: 09/14/14  7:50 PM  Result Value Ref Range Status   MRSA by PCR NEGATIVE NEGATIVE Final    Comment:        The GeneXpert MRSA Assay (FDA approved for NASAL specimens only), is one component of a comprehensive MRSA colonization surveillance program. It is not intended to diagnose MRSA infection nor to guide or monitor treatment for MRSA infections.   Culture, expectorated sputum-assessment     Status: None   Collection Time: 09/18/14  6:15 PM  Result Value Ref Range Status   Specimen Description SPUTUM  Final   Special Requests NONE  Final   Sputum evaluation THIS SPECIMEN IS ACCEPTABLE FOR SPUTUM CULTURE  Final   Report Status 09/19/2014 FINAL  Final  Culture, respiratory  (NON-Expectorated)     Status: None   Collection Time: 09/18/14  6:15 PM  Result Value Ref Range Status   Specimen Description SPUTUM  Final   Special Requests NONE Reflexed from [id number]  Final   Gram Stain   Final    FAIR SPECIMEN - 70-80% WBCS FEW WBC SEEN RARE GRAM POSITIVE COCCI IN CLUSTERS    Culture   Final    HEAVY GROWTH METHICILLIN RESISTANT STAPHYLOCOCCUS AUREUS CRITICAL RESULT CALLED TO, READ BACK BY AND VERIFIED WITH: PAM MEYERS AT 1218 09/21/14 DV    Report Status 09/21/2014 FINAL  Final   Organism ID, Bacteria METHICILLIN RESISTANT STAPHYLOCOCCUS AUREUS  Final      Susceptibility   Methicillin resistant staphylococcus aureus - MIC*    CIPROFLOXACIN 2 INTERMEDIATE Intermediate     GENTAMICIN <=0.5 SENSITIVE Sensitive     OXACILLIN >=4 RESISTANT Resistant     VANCOMYCIN 1 SENSITIVE Sensitive     TRIMETH/SULFA <=10 SENSITIVE Sensitive     CEFOXITIN SCREEN Value in next row Resistant      POSITIVECEFOXITIN SCREEN - This test may be used to predict mecA-mediated oxacillin resistance, and it is based on the cefoxitin disk screen test.  The cefoxitin screen and oxacillin work in combination to determine the final interpretation reported for oxacillin.     Inducible Clindamycin Value in next row Resistant      POSITIVEINDUCIBLE CLINDAMYCIN RESISTANCE - A positive ICR test is indicative of inducible resistance to macrolides, lincosamides, and type B  streptogramin.  This isolate is presumed to be resistant to Clindamycin, however, Clindamycin may still be effective in some patients.     TETRACYCLINE Value in next row Sensitive      SENSITIVE<=1    * HEAVY GROWTH METHICILLIN RESISTANT STAPHYLOCOCCUS AUREUS    Medical History: Past Medical History  Diagnosis Date  . COPD (chronic obstructive pulmonary disease)   . Arthritis     Medications:  Anti-infectives    Start     Dose/Rate Route Frequency Ordered Stop   09/24/14 1800  vancomycin (VANCOCIN) 1,500 mg in sodium chloride  0.9 % 500 mL IVPB     1,500 mg 250 mL/hr over 120 Minutes Intravenous Every 18 hours 09/23/14 1554     09/23/14 2130  vancomycin (VANCOCIN) 1,500 mg in sodium chloride 0.9 % 500 mL IVPB  Status:  Discontinued     1,500 mg 250 mL/hr over 120 Minutes Intravenous Every 18 hours 09/23/14 1532 09/23/14 1533   09/23/14 1600  vancomycin (VANCOCIN) 1,500 mg in sodium chloride 0.9 % 500 mL IVPB  Status:  Discontinued     1,500 mg 250 mL/hr over 120 Minutes Intravenous Every 18 hours 09/23/14 1533 09/23/14 1554   09/21/14 1500  vancomycin (VANCOCIN) 1,500 mg in sodium chloride 0.9 % 500 mL IVPB  Status:  Discontinued     1,500 mg 250 mL/hr over 120 Minutes Intravenous Every 12 hours 09/21/14 1426 09/23/14 1532   09/17/14 1200  azithromycin (ZITHROMAX) 500 mg in dextrose 5 % 250 mL IVPB  Status:  Discontinued     500 mg 250 mL/hr over 60 Minutes Intravenous Every 24 hours 09/17/14 1113 09/18/14 1154   09/15/14 1800  azithromycin (ZITHROMAX) 500 mg in dextrose 5 % 250 mL IVPB  Status:  Discontinued     500 mg 250 mL/hr over 60 Minutes Intravenous Every 24 hours 09/14/14 0345 09/15/14 1311   09/15/14 1800  azithromycin (ZITHROMAX) tablet 500 mg  Status:  Discontinued     500 mg Oral Daily 09/15/14 1311 09/17/14 1113   09/14/14 0145  azithromycin (ZITHROMAX) 500 mg in dextrose 5 % 250 mL IVPB     500 mg 250 mL/hr over 60 Minutes Intravenous  Once 09/14/14 0133 09/14/14 0302     Assessment: Patient with continued SOB at rest. Empirically adding Vancomycin for MRSA coverage. Patient currently on day 3 of vancomycin 1500 IV Q12hr for goal trough of 15-20.   Goal of Therapy:  Vancomycin trough level 15-20 mcg/ml  Plan:  Patient received 1500 dose on 7/17. Will hold vancomycin x 24 hours and resume patient on vancomycin  IV Q18hr for goal trough of 15-20. Unless otherwise clinically indicated, will obtain trough prior to 4th dose of new regimen.    Pharmacy will continue to monitor and adjust  per consult.     Charlies Silvers, PharmD  09/23/2014 4:00 PM

## 2014-09-23 NOTE — Progress Notes (Signed)
PHARMACY - CRITICAL CARE PROGRESS NOTE  Pharmacy Consult for Electrolyte Replacement Indication: ICU patient   Allergies  Allergen Reactions  . Other Other (See Comments)    darvocet--pt reports blistering with this medication  . Penicillins Nausea And Vomiting  . Darvon [Propoxyphene] Other (See Comments)    Blisters    Patient Measurements: Height:  (149.9 cm) Weight: (!) 396 lb 13.3 oz (180 kg) IBW/kg (Calculated) : 43.2   Vital Signs: Temp: 98.1 F (36.7 C) (07/17 0700) Temp Source: Axillary (07/17 0700) BP: 111/49 mmHg (07/17 0800) Pulse Rate: 90 (07/17 0800) Intake/Output from previous day: 07/16 0701 - 07/17 0700 In: 1240 [P.O.:240; IV Piggyback:1000] Out: 1325 [Urine:1325] Intake/Output from this shift:   Vent settings for last 24 hours: Vent Mode:  [-]  FiO2 (%):  [65 %-70 %] 70 %  Labs:  Recent Labs  09/22/14 0617 09/23/14 0644  WBC 7.3 6.4  HGB 9.9* 10.4*  HCT 30.6* 32.1*  PLT 66* 81*  CREATININE 0.66 0.61  MG  --  1.9  PHOS  --  3.8   Estimated Creatinine Clearance: 101.1 mL/min (by C-G formula based on Cr of 0.61).   Recent Labs  09/22/14 1619 09/22/14 2100 09/23/14 0801  GLUCAP 245* 194* 236*    Microbiology: Recent Results (from the past 720 hour(s))  MRSA PCR Screening     Status: None   Collection Time: 09/14/14  7:50 PM  Result Value Ref Range Status   MRSA by PCR NEGATIVE NEGATIVE Final    Comment:        The GeneXpert MRSA Assay (FDA approved for NASAL specimens only), is one component of a comprehensive MRSA colonization surveillance program. It is not intended to diagnose MRSA infection nor to guide or monitor treatment for MRSA infections.   Culture, expectorated sputum-assessment     Status: None   Collection Time: 09/18/14  6:15 PM  Result Value Ref Range Status   Specimen Description SPUTUM  Final   Special Requests NONE  Final   Sputum evaluation THIS SPECIMEN IS ACCEPTABLE FOR SPUTUM CULTURE  Final   Report Status 09/19/2014 FINAL  Final  Culture, respiratory (NON-Expectorated)     Status: None   Collection Time: 09/18/14  6:15 PM  Result Value Ref Range Status   Specimen Description SPUTUM  Final   Special Requests NONE Reflexed from [id number]  Final   Gram Stain   Final    FAIR SPECIMEN - 70-80% WBCS FEW WBC SEEN RARE GRAM POSITIVE COCCI IN CLUSTERS    Culture   Final    HEAVY GROWTH METHICILLIN RESISTANT STAPHYLOCOCCUS AUREUS CRITICAL RESULT CALLED TO, READ BACK BY AND VERIFIED WITH: PAM MEYERS AT 1218 09/21/14 DV    Report Status 09/21/2014 FINAL  Final   Organism ID, Bacteria METHICILLIN RESISTANT STAPHYLOCOCCUS AUREUS  Final      Susceptibility   Methicillin resistant staphylococcus aureus - MIC*    CIPROFLOXACIN 2 INTERMEDIATE Intermediate     GENTAMICIN <=0.5 SENSITIVE Sensitive     OXACILLIN >=4 RESISTANT Resistant     VANCOMYCIN 1 SENSITIVE Sensitive     TRIMETH/SULFA <=10 SENSITIVE Sensitive     CEFOXITIN SCREEN Value in next row Resistant      POSITIVECEFOXITIN SCREEN - This test may be used to predict mecA-mediated oxacillin resistance, and it is based on the cefoxitin disk screen test.  The cefoxitin screen and oxacillin work in combination to determine the final interpretation reported for oxacillin.     Inducible Clindamycin Value  in next row Resistant      POSITIVEINDUCIBLE CLINDAMYCIN RESISTANCE - A positive ICR test is indicative of inducible resistance to macrolides, lincosamides, and type B streptogramin.  This isolate is presumed to be resistant to Clindamycin, however, Clindamycin may still be effective in some patients.     TETRACYCLINE Value in next row Sensitive      SENSITIVE<=1    * HEAVY GROWTH METHICILLIN RESISTANT STAPHYLOCOCCUS AUREUS    Medications:  Scheduled:  . antiseptic oral rinse  7 mL Mouth Rinse BID  . apixaban  5 mg Oral BID  . budesonide (PULMICORT) nebulizer solution  0.5 mg Nebulization BID  . dextromethorphan  30 mg Oral BID  .  diltiazem  30 mg Oral 4 times per day  . docusate sodium  200 mg Oral BID  . guaiFENesin  600 mg Oral BID  . insulin aspart  0-20 Units Subcutaneous TID WC  . insulin aspart  0-5 Units Subcutaneous QHS  . ipratropium-albuterol  3 mL Nebulization Q4H  . methylPREDNISolone (SOLU-MEDROL) injection  60 mg Intravenous Q6H  . sertraline  100 mg Oral Daily  . theophylline  200 mg Oral Q12H  . tiotropium  18 mcg Inhalation Daily  . traZODone  100 mg Oral QHS  . vancomycin  1,500 mg Intravenous Q12H   Infusions:    Assessment: All electrolytes are within normal limits.   Plan:  Will follow up on am labs.  Lorraine Cimmino D 09/23/2014,8:30 AM

## 2014-09-24 LAB — BASIC METABOLIC PANEL
Anion gap: 6 (ref 5–15)
BUN: 34 mg/dL — ABNORMAL HIGH (ref 6–20)
CO2: 38 mmol/L — ABNORMAL HIGH (ref 22–32)
CREATININE: 0.67 mg/dL (ref 0.44–1.00)
Calcium: 7.7 mg/dL — ABNORMAL LOW (ref 8.9–10.3)
Chloride: 89 mmol/L — ABNORMAL LOW (ref 101–111)
GFR calc non Af Amer: >60 mL/min (ref >60)
GLUCOSE: 265 mg/dL — AB (ref 65–99)
POTASSIUM: 4.5 mmol/L (ref 3.5–5.1)
Sodium: 133 mmol/L — ABNORMAL LOW (ref 135–145)

## 2014-09-24 LAB — GLUCOSE, CAPILLARY
Glucose-Capillary: 287 mg/dL — ABNORMAL HIGH (ref 65–99)
Glucose-Capillary: 245 mg/dL — ABNORMAL HIGH (ref 65–99)
Glucose-Capillary: 229 mg/dL — ABNORMAL HIGH (ref 65–99)
Glucose-Capillary: 254 mg/dL — ABNORMAL HIGH (ref 65–99)

## 2014-09-24 LAB — THYROID PANEL WITH TSH
Free Thyroxine Index: 1.9 (ref 1.2–4.9)
T3 Uptake Ratio: 39 % (ref 24–39)
T4 TOTAL: 4.8 ug/dL (ref 4.5–12.0)
TSH: 0.707 u[IU]/mL (ref 0.450–4.500)

## 2014-09-24 MED ORDER — ACETAZOLAMIDE 250 MG PO TABS
250.0000 mg | ORAL_TABLET | Freq: Two times a day (BID) | ORAL | Status: DC
  Administered 2014-09-24 – 2014-10-03 (×19): 250 mg via ORAL
  Filled 2014-09-24 (×19): qty 1

## 2014-09-24 MED ORDER — INSULIN GLARGINE 100 UNIT/ML ~~LOC~~ SOLN
10.0000 [IU] | Freq: Every day | SUBCUTANEOUS | Status: DC
  Administered 2014-09-24: 10 [IU] via SUBCUTANEOUS
  Filled 2014-09-24 (×2): qty 0.1

## 2014-09-24 MED ORDER — METHYLPREDNISOLONE SODIUM SUCC 125 MG IJ SOLR
60.0000 mg | Freq: Two times a day (BID) | INTRAMUSCULAR | Status: DC
  Administered 2014-09-24 – 2014-09-25 (×2): 60 mg via INTRAVENOUS
  Filled 2014-09-24 (×2): qty 2

## 2014-09-24 NOTE — Progress Notes (Signed)
PHARMACY - CRITICAL CARE PROGRESS NOTE  Pharmacy Consult for Electrolyte Replacement Indication: ICU patient   Allergies  Allergen Reactions  . Other Other (See Comments)    darvocet--pt reports blistering with this medication  . Penicillins Nausea And Vomiting  . Darvon [Propoxyphene] Other (See Comments)    Blisters    Patient Measurements: Height: 4\' 11"  (149.9 cm) Weight: (!) 396 lb 13.3 oz (180 kg) IBW/kg (Calculated) : 43.2   Vital Signs: Temp: 97.6 F (36.4 C) (07/18 0700) Temp Source: Axillary (07/18 0700) BP: 130/63 mmHg (07/18 0600) Pulse Rate: 45 (07/18 0600) Intake/Output from previous day: 07/17 0701 - 07/18 0700 In: 1220 [P.O.:720; IV Piggyback:500] Out: 1100 [Urine:1100] Intake/Output from this shift:   Vent settings for last 24 hours: Vent Mode:  [-]  FiO2 (%):  [65 %-70 %] 65 %  Labs:  Recent Labs  09/22/14 0617 09/23/14 0644 09/24/14 0442  WBC 7.3 6.4  --   HGB 9.9* 10.4*  --   HCT 30.6* 32.1*  --   PLT 66* 81*  --   CREATININE 0.66 0.61 0.67  MG  --  1.9  --   PHOS  --  3.8  --    Estimated Creatinine Clearance: 101.1 mL/min (by C-G formula based on Cr of 0.67).   Recent Labs  09/23/14 1602 09/23/14 2135 09/24/14 0731  GLUCAP 253* 256* 287*    Microbiology: Recent Results (from the past 720 hour(s))  MRSA PCR Screening     Status: None   Collection Time: 09/14/14  7:50 PM  Result Value Ref Range Status   MRSA by PCR NEGATIVE NEGATIVE Final    Comment:        The GeneXpert MRSA Assay (FDA approved for NASAL specimens only), is one component of a comprehensive MRSA colonization surveillance program. It is not intended to diagnose MRSA infection nor to guide or monitor treatment for MRSA infections.   Culture, expectorated sputum-assessment     Status: None   Collection Time: 09/18/14  6:15 PM  Result Value Ref Range Status   Specimen Description SPUTUM  Final   Special Requests NONE  Final   Sputum evaluation THIS  SPECIMEN IS ACCEPTABLE FOR SPUTUM CULTURE  Final   Report Status 09/19/2014 FINAL  Final  Culture, respiratory (NON-Expectorated)     Status: None   Collection Time: 09/18/14  6:15 PM  Result Value Ref Range Status   Specimen Description SPUTUM  Final   Special Requests NONE Reflexed from [id number]  Final   Gram Stain   Final    FAIR SPECIMEN - 70-80% WBCS FEW WBC SEEN RARE GRAM POSITIVE COCCI IN CLUSTERS    Culture   Final    HEAVY GROWTH METHICILLIN RESISTANT STAPHYLOCOCCUS AUREUS CRITICAL RESULT CALLED TO, READ BACK BY AND VERIFIED WITH: PAM MEYERS AT 1218 09/21/14 DV    Report Status 09/21/2014 FINAL  Final   Organism ID, Bacteria METHICILLIN RESISTANT STAPHYLOCOCCUS AUREUS  Final      Susceptibility   Methicillin resistant staphylococcus aureus - MIC*    CIPROFLOXACIN 2 INTERMEDIATE Intermediate     GENTAMICIN <=0.5 SENSITIVE Sensitive     OXACILLIN >=4 RESISTANT Resistant     VANCOMYCIN 1 SENSITIVE Sensitive     TRIMETH/SULFA <=10 SENSITIVE Sensitive     CEFOXITIN SCREEN Value in next row Resistant      POSITIVECEFOXITIN SCREEN - This test may be used to predict mecA-mediated oxacillin resistance, and it is based on the cefoxitin disk screen test.  The  cefoxitin screen and oxacillin work in combination to determine the final interpretation reported for oxacillin.     Inducible Clindamycin Value in next row Resistant      POSITIVEINDUCIBLE CLINDAMYCIN RESISTANCE - A positive ICR test is indicative of inducible resistance to macrolides, lincosamides, and type B streptogramin.  This isolate is presumed to be resistant to Clindamycin, however, Clindamycin may still be effective in some patients.     TETRACYCLINE Value in next row Sensitive      SENSITIVE<=1    * HEAVY GROWTH METHICILLIN RESISTANT STAPHYLOCOCCUS AUREUS    Medications:  Scheduled:  . antiseptic oral rinse  7 mL Mouth Rinse BID  . apixaban  5 mg Oral BID  . budesonide (PULMICORT) nebulizer solution  0.5 mg  Nebulization BID  . dextromethorphan  30 mg Oral BID  . diltiazem  30 mg Oral 4 times per day  . docusate sodium  200 mg Oral BID  . guaiFENesin  600 mg Oral BID  . insulin aspart  0-20 Units Subcutaneous TID WC  . insulin aspart  0-5 Units Subcutaneous QHS  . ipratropium-albuterol  3 mL Nebulization Q4H  . methylPREDNISolone (SOLU-MEDROL) injection  60 mg Intravenous Q6H  . sertraline  100 mg Oral Daily  . theophylline  200 mg Oral Q12H  . tiotropium  18 mcg Inhalation Daily  . traZODone  100 mg Oral QHS  . vancomycin  1,500 mg Intravenous Q18H   Infusions:    Assessment: All electrolytes are within normal limits.   Plan:  Will follow up on am labs.  Eileen [name] D 09/24/2014,8:17 AM

## 2014-09-24 NOTE — Progress Notes (Signed)
[facility] [place] [facility]   PATIENT NAME: Eileen [name]    MR#:  [id number]  DATE OF BIRTH:  04/24/1945  SUBJECTIVE:  CHIEF COMPLAINT:   Chief Complaint  Patient presents with  . Shortness of Breath   Sleep well overnight on Bipap.  Awake & alert on Hiflo [place].  Overall feels better.   REVIEW OF SYSTEMS:   Review of Systems  Constitutional: Negative for fever and chills.  HENT: Negative for congestion and tinnitus.   Eyes: Negative for blurred vision and double vision.  Respiratory: Positive for shortness of breath (improved). Negative for cough and wheezing.   Cardiovascular: Negative for chest pain, orthopnea and PND.  Gastrointestinal: Negative for nausea, vomiting, abdominal pain and diarrhea.  Genitourinary: Negative for dysuria and hematuria.  Neurological: Negative for dizziness, sensory change and focal weakness.  All other systems reviewed and are negative.   DRUG ALLERGIES:   Allergies  Allergen Reactions  . Other Other (See Comments)    darvocet--pt reports blistering with this medication  . Penicillins Nausea And Vomiting  . Darvon [Propoxyphene] Other (See Comments)    Blisters    VITALS:  Blood pressure 107/95, pulse 61, temperature 97.6 F (36.4 C), temperature source Axillary, resp. rate 18, height  (1.499 m), weight 180 kg (396 lb 13.3 oz), SpO2 92 %.  PHYSICAL EXAMINATION:   GENERAL:  69 y.o.-year-old morbidly obsese patient lying in the bed in no apparent distress. EYES: Pupils equal, round, reactive to light and accommodation. No scleral icterus. Extraocular muscles intact.  HEENT: Head atraumatic, normocephalic. Oropharynx and nasopharynx clear.  On Hiflo [place].  Dry oral mucosa.   NECK:  Supple, no jugular venous distention. No thyroid enlargement, no tenderness.  LUNGS: No dullness to percussion Negative use of accessory muscles. No rales or rhonchi noted wheezing anteriorly. CARDIOVASCULAR: Distant S1, S2  normal. No murmurs, rubs, or gallops.  ABDOMEN: Soft, nontender, nondistended. Bowel sounds present. No organomegaly or mass.  EXTREMITIES: 1+ pedal edema, No cyanosis, or clubbing.  NEUROLOGIC: Cranial nerves II through XII are intact. No focal motor or sensory deficits bilaterally. Globally weak.  PSYCHIATRIC: The patient is alert and oriented x 3. Globally weak  SKIN: No obvious rash, lesion, or ulcer.   Foley catheter in place draining clear urine.    LABORATORY PANEL:   CBC  Recent Labs Lab 09/23/14 0644  WBC 6.4  HGB 10.4*  HCT 32.1*  PLT 81*   ------------------------------------------------------------------------------------------------------------------  Chemistries   Recent Labs Lab 09/23/14 0644 09/24/14 0442  NA 136 133*  K 4.6 4.5  CL 89* 89*  CO2 40* 38*  GLUCOSE 231* 265*  BUN 31* 34*  CREATININE 0.61 0.67  CALCIUM 7.9* 7.7*  MG 1.9  --    ------------------------------------------------------------------------------------------------------------------  Cardiac Enzymes No results for input(s): TROPONINI in the last 168 hours. ------------------------------------------------------------------------------------------------------------------  RADIOLOGY:  No results found.   ASSESSMENT AND PLAN:   69 year old female with past medical history of morbid obesity, obstructive sleep apnea, COPD, osteoarthritis, obesity pickwickian syndrome, chronic atrial fibrillation who presented to the hospital due to shortness of breath and noted to be in acute on chronic respiratory failure.  #1 acute on chronic hypoxic hypercarbic respiratory failure-this is likely secondary to patient's obesity pickwickian syndrome complicated with sleep apnea and COPD exacerbation/MRSA pneumonia. -Continue IV steroids but will taper, cont. DuoNeb nebs around-the-clock, Spiriva, and empiric antibiotics with vancomycin. -She continues to improve  -Continue BiPAP with naps and at  night. Pulmonary following  and agrees with management.  #2 COPD exacerbation-likely secondary to underlying pneumonia. -Continue IV steroids but will taper, cont. DuoNeb's, Spiriva, Pulmicort -Continue IV vancomycin for MRSA pneumonia. -Continue BiPAP support.  #3 chronic afibrillation-rate controlled and heart rate on the bradycardic side. D/C Cardizem -Continue Eliquis  #4 MRSA pneumonia-continue vancomycin.  #5 depression-continue Zoloft.  #6 morbid obesity with obesity pickwickian syndrome-continue BiPAP support.  #7 thrombocytopenia-seems chronic for the patient. Etiology unclear. -No acute bleeding, we'll avoid heparin products. Follow-up with hematology as outpatient.  #8 hypoglycemia-likely secondary to steroids. -Taper IV steroids. Continue sliding scale insulin, will add low-dose Lantus.  Patient likely needs either long-term acute care facility or long-term care at the skilled nursing facility with hospice services. I will get a hospice screen today.  All the records are reviewed and case discussed with Care Management/Social Workerr. Management plans discussed with the patient, family and they are in agreement.  CODE STATUS: DO NOT RESUSCITATE  TOTAL TIME TAKING CARE OF THIS PATIENT: 30 minutes.   Greater than 50% of time spent in coordination of care.  [place] SirenSAINANI,Lousie Calico J M.D on 09/24/2014 at 11:28 AM  Between 7am to 6pm - Pager - [phone]  After 6pm go to [email or web address] - password [email or web address]  Fabio Neighbors[facility] [place] [facility]  Office  [phone]  CC: Primary care physician; Pcp Not In System

## 2014-09-24 NOTE — Care Management Note (Signed)
Case Management Note  Patient Details  Name: Eileen [name] MRN: [id number] Date of Birth: February 29, 1946  Subjective/Objective:    [facility] to be [facility] by [facility]                Action/Plan:   Expected Discharge Date:                  Expected Discharge Plan:     In-House Referral:     Discharge planning Services     Post Acute Care Choice:    Choice offered to:     DME Arranged:    DME Agency:     HH Arranged:    HH Agency:     Status of Service:     Medicare Important Message Given:  Yes-second notification given Date Medicare IM Given:    Medicare IM give by:    Date Additional Medicare IM Given:    Additional Medicare Important Message give by:     If discussed at Long Length of Stay Meetings, dates discussed:    Additional Comments:  Marily MemosLisa M Talajah Slimp, RN 09/24/2014, 3:24 PM

## 2014-09-24 NOTE — Care Management Important Message (Signed)
Important Message  Patient Details  Name: Eileen [name] MRN: [id number] Date of Birth: 1945-12-21   Medicare Important Message Given:  Yes-second notification given    Olegario MessierKathy A Allmond 09/24/2014, 10:08 AM

## 2014-09-24 NOTE — Progress Notes (Signed)
ANTIBIOTIC CONSULT NOTE - FOLLOW UP   Pharmacy Consult for Vancomycin Indication: rule out pneumonia  Allergies  Allergen Reactions  . Other Other (See Comments)    darvocet--pt reports blistering with this medication  . Penicillins Nausea And Vomiting  . Darvon [Propoxyphene] Other (See Comments)    Blisters    Patient Measurements: Height: 4\' 11"  (149.9 cm) Weight: (!) 396 lb 13.3 oz (180 kg) IBW/kg (Calculated) : 43.2 Adjusted Body Weight: 102.5 kg  Vital Signs: Temp: 97.6 F (36.4 C) (07/18 0700) Temp Source: Axillary (07/18 0700) BP: 130/63 mmHg (07/18 0600) Pulse Rate: 45 (07/18 0600) Intake/Output from previous day: 07/17 0701 - 07/18 0700 In: 1220 [P.O.:720; IV Piggyback:500] Out: 1100 [Urine:1100] Intake/Output from this shift:    Labs:  Recent Labs  09/22/14 0617 09/23/14 0644 09/24/14 0442  WBC 7.3 6.4  --   HGB 9.9* 10.4*  --   PLT 66* 81*  --   CREATININE 0.66 0.61 0.67   Estimated Creatinine Clearance: 101.1 mL/min (by C-G formula based on Cr of 0.67).  Recent Labs  09/23/14 1444  VANCOTROUGH 27*     Microbiology: Recent Results (from the past 720 hour(s))  MRSA PCR Screening     Status: None   Collection Time: 09/14/14  7:50 PM  Result Value Ref Range Status   MRSA by PCR NEGATIVE NEGATIVE Final    Comment:        The GeneXpert MRSA Assay (FDA approved for NASAL specimens only), is one component of a comprehensive MRSA colonization surveillance program. It is not intended to diagnose MRSA infection nor to guide or monitor treatment for MRSA infections.   Culture, expectorated sputum-assessment     Status: None   Collection Time: 09/18/14  6:15 PM  Result Value Ref Range Status   Specimen Description SPUTUM  Final   Special Requests NONE  Final   Sputum evaluation THIS SPECIMEN IS ACCEPTABLE FOR SPUTUM CULTURE  Final   Report Status 09/19/2014 FINAL  Final  Culture, respiratory (NON-Expectorated)     Status: None   Collection  Time: 09/18/14  6:15 PM  Result Value Ref Range Status   Specimen Description SPUTUM  Final   Special Requests NONE Reflexed from [id number]  Final   Gram Stain   Final    FAIR SPECIMEN - 70-80% WBCS FEW WBC SEEN RARE GRAM POSITIVE COCCI IN CLUSTERS    Culture   Final    HEAVY GROWTH METHICILLIN RESISTANT STAPHYLOCOCCUS AUREUS CRITICAL RESULT CALLED TO, READ BACK BY AND VERIFIED WITH: PAM MEYERS AT 1218 09/21/14 DV    Report Status 09/21/2014 FINAL  Final   Organism ID, Bacteria METHICILLIN RESISTANT STAPHYLOCOCCUS AUREUS  Final      Susceptibility   Methicillin resistant staphylococcus aureus - MIC*    CIPROFLOXACIN 2 INTERMEDIATE Intermediate     GENTAMICIN <=0.5 SENSITIVE Sensitive     OXACILLIN >=4 RESISTANT Resistant     VANCOMYCIN 1 SENSITIVE Sensitive     TRIMETH/SULFA <=10 SENSITIVE Sensitive     CEFOXITIN SCREEN Value in next row Resistant      POSITIVECEFOXITIN SCREEN - This test may be used to predict mecA-mediated oxacillin resistance, and it is based on the cefoxitin disk screen test.  The cefoxitin screen and oxacillin work in combination to determine the final interpretation reported for oxacillin.     Inducible Clindamycin Value in next row Resistant      POSITIVEINDUCIBLE CLINDAMYCIN RESISTANCE - A positive ICR test is indicative of inducible resistance to macrolides,  lincosamides, and type B streptogramin.  This isolate is presumed to be resistant to Clindamycin, however, Clindamycin may still be effective in some patients.     TETRACYCLINE Value in next row Sensitive      SENSITIVE<=1    * HEAVY GROWTH METHICILLIN RESISTANT STAPHYLOCOCCUS AUREUS    Medical History: Past Medical History  Diagnosis Date  . COPD (chronic obstructive pulmonary disease)   . Arthritis     Medications:  Anti-infectives    Start     Dose/Rate Route Frequency Ordered Stop   09/24/14 1800  vancomycin (VANCOCIN) 1,500 mg in sodium chloride 0.9 % 500 mL IVPB     1,500 mg 250 mL/hr over  120 Minutes Intravenous Every 18 hours 09/23/14 1554     09/23/14 2130  vancomycin (VANCOCIN) 1,500 mg in sodium chloride 0.9 % 500 mL IVPB  Status:  Discontinued     1,500 mg 250 mL/hr over 120 Minutes Intravenous Every 18 hours 09/23/14 1532 09/23/14 1533   09/23/14 1600  vancomycin (VANCOCIN) 1,500 mg in sodium chloride 0.9 % 500 mL IVPB  Status:  Discontinued     1,500 mg 250 mL/hr over 120 Minutes Intravenous Every 18 hours 09/23/14 1533 09/23/14 1554   09/21/14 1500  vancomycin (VANCOCIN) 1,500 mg in sodium chloride 0.9 % 500 mL IVPB  Status:  Discontinued     1,500 mg 250 mL/hr over 120 Minutes Intravenous Every 12 hours 09/21/14 1426 09/23/14 1532   09/17/14 1200  azithromycin (ZITHROMAX) 500 mg in dextrose 5 % 250 mL IVPB  Status:  Discontinued     500 mg 250 mL/hr over 60 Minutes Intravenous Every 24 hours 09/17/14 1113 09/18/14 1154   09/15/14 1800  azithromycin (ZITHROMAX) 500 mg in dextrose 5 % 250 mL IVPB  Status:  Discontinued     500 mg 250 mL/hr over 60 Minutes Intravenous Every 24 hours 09/14/14 0345 09/15/14 1311   09/15/14 1800  azithromycin (ZITHROMAX) tablet 500 mg  Status:  Discontinued     500 mg Oral Daily 09/15/14 1311 09/17/14 1113   09/14/14 0145  azithromycin (ZITHROMAX) 500 mg in dextrose 5 % 250 mL IVPB     500 mg 250 mL/hr over 60 Minutes Intravenous  Once 09/14/14 0133 09/14/14 0302     Assessment: Patient with continued SOB at rest. Empirically adding Vancomycin for MRSA coverage. Patient currently on day 4 of vancomycin IV   Goal of Therapy:  Vancomycin trough level 15-20 mcg/ml  Plan:  Patient received 1500 dose on 7/17.Will continue Vancomycin 1500 mg IV q18 hours for now. Trough level ordered to be drawn will obtain trough prior to the 04:00 dose on 7/20.    Pharmacy will continue to monitor and adjust per consult.     Demetrius Charity, PharmD   09/24/2014 8:18 AM
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
## 2014-09-24 NOTE — Progress Notes (Signed)
Pt alert and oriented. Vitals stable. No complaints of pain or SOB- AFIB on monitor- controlled. Foley in place- urine  Output adequate.  [facility].  Lantaus started for PM due to BS in 200's.  Cardizem discontinued.  Husband at bedside and patient resting at this time.

## 2014-09-24 NOTE — Clinical Social Work Note (Signed)
Patient currently on high flow . Patient will need to have PT assess patient once she is more appropriate. CSW is expecting that patient will more than likely require SNF. York SpanielMonica Jacobo Moncrief MSW,LCSW [phone]

## 2014-09-24 NOTE — Progress Notes (Signed)
Request for evaluation for hospice services after discharge, received from [place]. Ms. Hersh was  admitted on 09/14/14 for acute on chronic respiratory failure requiring Bipap. She is currently on Hi Flow oxygen at 50L/min and requiring Bipap at night.  Writer met with Mrs. Sowder in her room, she was alert and oriented. She is familiar with hospice services as she has had then in the past. Her current oxygen requirements are not able to be met in a home environment. Mrs. Rencher voiced her understanding. Will continue to follow and monitor for changes in oxygen requirements. Flo Shanks RN, BSN, [place] [facility] [place], [facility] [phone] c

## 2014-09-25 LAB — CBC
HCT: 32.1 % — ABNORMAL LOW (ref 35.0–47.0)
HEMOGLOBIN: 10.5 g/dL — AB (ref 12.0–16.0)
MCH: 29.4 pg (ref 26.0–34.0)
MCHC: 32.7 g/dL (ref 32.0–36.0)
MCV: 89.9 fL (ref 80.0–100.0)
Platelets: 111 10*3/uL — ABNORMAL LOW (ref 150–440)
RBC: 3.57 MIL/uL — ABNORMAL LOW (ref 3.80–5.20)
RDW: 15.4 % — AB (ref 11.5–14.5)
WBC: 5.5 10*3/uL (ref 3.6–11.0)

## 2014-09-25 LAB — BASIC METABOLIC PANEL
ANION GAP: 9 (ref 5–15)
BUN: 34 mg/dL — ABNORMAL HIGH (ref 6–20)
CHLORIDE: 91 mmol/L — AB (ref 101–111)
CO2: 31 mmol/L (ref 22–32)
CREATININE: 0.78 mg/dL (ref 0.44–1.00)
Calcium: 7.8 mg/dL — ABNORMAL LOW (ref 8.9–10.3)
GFR calc Af Amer: >60 mL/min (ref >60)
GFR calc non Af Amer: >60 mL/min (ref >60)
GLUCOSE: 270 mg/dL — AB (ref 65–99)
Potassium: 4.4 mmol/L (ref 3.5–5.1)
Sodium: 131 mmol/L — ABNORMAL LOW (ref 135–145)

## 2014-09-25 LAB — GLUCOSE, CAPILLARY
GLUCOSE-CAPILLARY: 275 mg/dL — AB (ref 65–99)
GLUCOSE-CAPILLARY: 193 mg/dL — AB (ref 65–99)
Glucose-Capillary: 251 mg/dL — ABNORMAL HIGH (ref 65–99)
Glucose-Capillary: 284 mg/dL — ABNORMAL HIGH (ref 65–99)

## 2014-09-25 MED ORDER — INSULIN GLARGINE 100 UNIT/ML ~~LOC~~ SOLN
15.0000 [IU] | Freq: Every day | SUBCUTANEOUS | Status: DC
  Administered 2014-09-25 – 2014-10-02 (×7): 15 [IU] via SUBCUTANEOUS
  Filled 2014-09-25 (×10): qty 0.15

## 2014-09-25 MED ORDER — METHYLPREDNISOLONE SODIUM SUCC 40 MG IJ SOLR
20.0000 mg | Freq: Every day | INTRAMUSCULAR | Status: DC
Start: 2014-09-26 — End: 2014-09-27
  Administered 2014-09-26 – 2014-09-27 (×2): 20 mg via INTRAVENOUS
  Filled 2014-09-25 (×2): qty 1

## 2014-09-25 MED ORDER — SENNOSIDES-DOCUSATE SODIUM 8.6-50 MG PO TABS
1.0000 | ORAL_TABLET | Freq: Two times a day (BID) | ORAL | Status: DC
  Administered 2014-09-25 – 2014-10-03 (×16): 1 via ORAL
  Filled 2014-09-25 (×17): qty 1

## 2014-09-25 MED ORDER — LORAZEPAM 0.5 MG PO TABS: 1.0000 mg | ORAL_TABLET | Freq: Three times a day (TID) | ORAL | Status: DC

## 2014-09-25 NOTE — Care Management Note (Signed)
Case Management Note  Patient Details  Name: Eileen [name] MRN: [id number] Date of Birth: [birth year]  Subjective/Objective:  Met with patient to discuss LTACH. Although patient is open to going, she can not afford the 400$/day day 1-7, out of pocket max of $5100 required for her to be at [facility]. She is willing to discuss SNF services in [place] area and is interested in home hospice services following SNF.  She was tearful stating her husband and son was all she had and she couldn't burden them any more that they are now.  Working on weaning currently O2. CSW updated.                  Action/Plan: Wean O2. SNF at discharge. CSW updated.   Expected Discharge Date:                  Expected Discharge Plan:  Skilled Nursing Facility  In-House Referral:  Clinical Social Work  Discharge planning Services  CM Consult  Post Acute Care Choice:    Choice offered to:  Patient  DME Arranged:    DME Agency:  [place] Arranged:    [facility] Agency:     Status of Service:  In process, will continue to follow  Medicare Important Message Given:  Yes-second notification given Date Medicare IM Given:    Medicare IM give by:    Date Additional Medicare IM Given:    Additional Medicare Important Message give by:     If discussed at [place] of Stay Meetings, dates discussed:    Additional Comments:  Jolly Mango, RN 09/25/2014, 1:49 PM

## 2014-09-25 NOTE — Progress Notes (Signed)
[facility] [place] at [facility], [facility]   PATIENT NAME: Eileen [name]    MR#:  [id number]  DATE OF BIRTH:  Jul 26, 1945  SUBJECTIVE:  CHIEF COMPLAINT:   Chief Complaint  Patient presents with  . Shortness of Breath   Overall feels better.  No complaints.  Shortness of breath improved.   REVIEW OF SYSTEMS:   Review of Systems  Constitutional: Negative for fever and chills.  HENT: Negative for congestion and tinnitus.   Eyes: Negative for blurred vision and double vision.  Respiratory: Positive for shortness of breath (improved). Negative for cough and wheezing.   Cardiovascular: Negative for chest pain, orthopnea and PND.  Gastrointestinal: Negative for nausea, vomiting, abdominal pain and diarrhea.  Genitourinary: Negative for dysuria and hematuria.  Neurological: Negative for dizziness, sensory change and focal weakness.  All other systems reviewed and are negative.   DRUG ALLERGIES:   Allergies  Allergen Reactions  . Other Other (See Comments)    darvocet--pt reports blistering with this medication  . Penicillins Nausea And Vomiting  . Darvon [Propoxyphene] Other (See Comments)    Blisters    VITALS:  Blood pressure 142/127, pulse 89, temperature 97.5 F (36.4 C), temperature source Oral, resp. rate 20, height 4\' 11"  (1.499 m), weight 194 kg (427 lb 11.1 oz), SpO2 95 %.  PHYSICAL EXAMINATION:   GENERAL:  69 y.o.-year-old morbidly obsese patient lying in the bed in no apparent distress. EYES: Pupils equal, round, reactive to light and accommodation. No scleral icterus. Extraocular muscles intact.  HEENT: Head atraumatic, normocephalic. Oropharynx and nasopharynx clear.  On Hiflo Berkey.  Most oral mucosa NECK:  Supple, no jugular venous distention. No thyroid enlargement, no tenderness.  LUNGS: No dullness to percussion. Negative use of accessory muscles. No rales or rhonchi noted wheezing anteriorly. CARDIOVASCULAR: Distant S1, S2 normal. No  murmurs, rubs, or gallops.  ABDOMEN: Soft, nontender, nondistended. Bowel sounds present. No organomegaly or mass.  EXTREMITIES: 1+ pedal edema, No cyanosis, or clubbing.  NEUROLOGIC: Cranial nerves II through XII are intact. No focal motor or sensory deficits bilaterally. Globally weak.  PSYCHIATRIC: The patient is alert and oriented x 3. Globally weak  SKIN: No obvious rash, lesion, or ulcer.   Foley catheter in place draining clear urine.    LABORATORY PANEL:   CBC  Recent Labs Lab 09/25/14 0445  WBC 5.5  HGB 10.5*  HCT 32.1*  PLT 111*   ------------------------------------------------------------------------------------------------------------------  Chemistries   Recent Labs Lab 09/23/14 0644  09/25/14 0445  NA 136  < > 131*  K 4.6  < > 4.4  CL 89*  < > 91*  CO2 40*  < > 31  GLUCOSE 231*  < > 270*  BUN 31*  < > 34*  CREATININE 0.61  < > 0.78  CALCIUM 7.9*  < > 7.8*  MG 1.9  --   --   < > = values in this interval not displayed. ------------------------------------------------------------------------------------------------------------------  Cardiac Enzymes No results for input(s): TROPONINI in the last 168 hours. ------------------------------------------------------------------------------------------------------------------  RADIOLOGY:  No results found.   ASSESSMENT AND PLAN:   69 year old female with past medical history of morbid obesity, obstructive sleep apnea, COPD, osteoarthritis, obesity pickwickian syndrome, chronic atrial fibrillation who presented to the hospital due to shortness of breath and noted to be in acute on chronic respiratory failure.  #1 acute on chronic hypoxic hypercarbic respiratory failure-this is likely secondary to patient's obesity pickwickian syndrome complicated with sleep apnea and COPD exacerbation/MRSA pneumonia. -Continue IV  steroids but cont. To taper, cont. DuoNeb nebs around-the-clock, Spiriva, and empiric  antibiotics with vancomycin. -Continue BiPAP with naps and at night. Pulmonary following and agrees with management.  #2 COPD exacerbation-likely secondary to underlying pneumonia. -Continue IV steroids but cont. To taper, cont. DuoNeb's, Spiriva, Pulmicort -Continue IV vancomycin for MRSA pneumonia. -Continue BiPAP support.  #3 chronic afibrillation-rate controlled and heart rate on the bradycardic side. Off rate controlling meds.  -Continue Eliquis  #4 MRSA pneumonia-continue vancomycin. - ?? Switch to oral Bactrim upon discharge.  Likely needs 2 weeks of therapy.   #5 depression-continue Zoloft.  #6 morbid obesity with obesity pickwickian syndrome-continue BiPAP support.  #7 thrombocytopenia-seems chronic for the patient. Etiology unclear. -No acute bleeding, avoid heparin products. Follow-up with hematology as outpatient.  #8 hyperglycemia-likely secondary to steroids. - cont. To taper steroids. Continue sliding scale insulin, advance Lantus.   Seen by Hospice and not appropriate for hospice as still on HiFlo Meagher.  As per CM awaiting insurance approval for LTAC.   All the records are reviewed and case discussed with Care Management/Social Workerr. Management plans discussed with the patient, family and they are in agreement.  CODE STATUS: DO NOT RESUSCITATE  TOTAL TIME TAKING CARE OF THIS PATIENT: 30 minutes.    Houston Siren M.D on 09/25/2014 at 12:40 PM  Between 7am to 6pm - Pager - [phone]  After 6pm go to [email or web address] - password [email or web address]  [facility]  Office  [phone]  CC: Primary care physician; Pcp Not In System

## 2014-09-25 NOTE — Progress Notes (Signed)
Nutrition Follow-up   INTERVENTION:   1) Meals/Snacks: cater to pt preferences, continue snacks between meals; as po intake improving, pt may benefit from changing diet to carb controlled diabetic 2) Coordination of Care: discussed poc during ICU rounds; plan to start constipation prevention protocol as pt cannot remember when she had her last BM; per documentation, it appears that last BM 7/12 or 7/13. Also discussed elevated FSBS; pt on solumedrol, started on 10 units of lantus last night   NUTRITION DIAGNOSIS:   Inadequate oral intake related to acute illness as evidenced by meal completion < 25%. Improving as pt eating 50-100% of meals, receiving snacks between meals as well   GOAL:   Patient will meet greater than or equal to 90% of their needs  MONITOR:    (Energy Intake, Pulmonary, Anthropometrics, Electrolyte/Renal Profile, Glucose Profile)   ASSESSMENT:   Pt on HFNC, Bipap at night; appetite improved   Diet Order:  Diet regular Room service appropriate?: Yes; Fluid consistency:: Thin  Energy Intake:  Pt appetite improved, pt ate 100% at breakfast this AM; ate 100% at dinner last night, 50% at lunch; per Eileen MuirJamie RN, pt upset that she missed breakfast yesterday because she was sleeping and wanted to make sure she is awake at meal times. Recorded po intake 2 days ago, 90-100% of meals  Electrolyte and Renal Profile:  Recent Labs Lab 09/19/14 0621 09/20/14 0422  09/23/14 0644 09/24/14 0442 09/25/14 0445  BUN 31* 26*  < > 31* 34* 34*  CREATININE 0.69 0.64  < > 0.61 0.67 0.78  NA 140 138  < > 136 133* 131*  K 3.2* 4.2  < > 4.6 4.5 4.4  MG 1.6* 2.0  --  1.9  --   --   PHOS  --   --   --  3.8  --   --   < > = values in this interval not displayed.  Glucose Profile:  Recent Labs  09/24/14 1129 09/24/14 1635 09/24/14 2150  GLUCAP 245* 229* 254*   Protein Profile: No results for input(s): ALBUMIN in the last 168 hours.  Meds: solumedrol, ss novolog, lantus,  senokot prn  Skin:   (stage I pressure ulcer)  Last BM:  7/13  , pt unsure when she last had BM Height:   Ht Readings from Last 1 Encounters:  09/17/14 4\' 11"  (1.499 m)    Weight:   Wt Readings from Last 1 Encounters:  09/25/14 427 lb 11.1 oz (194 kg)   Filed Weights   09/21/14 0500 09/23/14 0500 09/25/14 0455  Weight: 414 lb 7.4 oz (188 kg) 396 lb 13.3 oz (180 kg) 427 lb 11.1 oz (194 kg)     BMI:  Body mass index is 86.34 kg/(m^2).  Estimated Nutritional Needs:   Kcal:  Using IBW of 44kg (BEE 865 kcals (IF 1.0-1.2, Af 1.2) [id number] kcals/d  Protein:  Using IBW of 44kg (1.0-1.2 g/kg) 44-53 gm/d  Fluid:  Using IBW of 44kg (25-6730ml/kg)1100-1320ml/d  EDUCATION NEEDS:   No education needs identified at this time  LOW Care Level  Eileen Starcherate Kensleigh Gates MS, RD, LDN [phone] Pager

## 2014-09-25 NOTE — Consult Note (Signed)
  Psychiatry: Follow-up for patient with a history of depression still in the critical care unit struggling with respiratory insufficiency. Patient states that her mood is doing pretty well. She worries about her husband and his diabetes but is not focusing on her own health as a worry. Her mood stays fairly good. She has not experienced any suicidal thoughts. Not experiencing any hallucinations or psychotic thinking.  Continues to obviously be short of breath otherwise no change to her review of systems.  Patient was awake alert and oriented. No sign of delirium. Made reasonable eye contact. Appropriate affect. No sign of disorganized thinking. No suicidal ideation.  Patient appears to be stable and doing well psychiatrically. No indication to change anything about her medicine. I will follow-up intermittently as needed. No change to diagnosis.

## 2014-09-25 NOTE — Care Management Note (Signed)
Case Management Note  Patient Details  Name: Modena MorrowShirley A Clary MRN: [id number] Date of Birth: 03-Nov-1945  Subjective/Objective:                   Remains on 50L/min HFNC during the day with BIPAP at HS (65%).   Action/Plan: More alert and oriented. Talking. Hospice has evaluated patient and has concerns with high level of O2, [facility]    Expected Discharge Date:                  Expected Discharge Plan:     In-House Referral:     Discharge planning Services     Post Acute Care Choice:    Choice offered to:     DME Arranged:    DME Agency:     HH Arranged:    HH Agency:     Status of Service:     Medicare Important Message Given:  Yes-second notification given Date Medicare IM Given:    Medicare IM give by:    Date Additional Medicare IM Given:    Additional Medicare Important Message give by:     If discussed at Long Length of Stay Meetings, dates discussed:    Additional Comments:  Marily MemosLisa M Sunnie Odden, RN 09/25/2014, 11:22 AM

## 2014-09-25 NOTE — Evaluation (Signed)
Physical Therapy Evaluation Patient Details Name: Eileen [name] MRN: [id number] DOB: 28-Mar-1945 Today's Date: 09/25/2014   History of Present Illness  presented to ER secondary to SOB, cough and hypoxia; admitted with acute/chronic respiratory failure related to COPD exacerbation.  Hospital course significant for further respiratory decline (requiring BiPAP) and runs of a-fib (requiring Cardizem drip, now off) with transition to CCU.  Currently on HFNC at 50%, 50L/min.  Clinical Impression  Upon evaluation, patient alert and oriented to basic information, follows simple commands; noted deficits in short-term memory.  Bilat UE strength and ROM grossly WFL.  Bilat knee ROM significantly limited (L knee fused, R knee with previous injury, rests in position of hyperextension), but hips and ankles grossly WFL.  Patient globally deconditioned with very limited activity tolerance, though sats remain >93% on HFNC throughout session.  Attempted rolling with dep assist from therapist, but unable to complete/fully unweight buttocks with movement attempt. Unsafe/unable to tolerate mobility beyond rolling at this time. Would benefit from skilled PT to address above deficits and promote optimal return to PLOF; recommend transition to STR vs LTAC upon discharge from acute hospitalization.  Will closely monitor participation/progress, as patient very limited in mobility at baseline.     Follow Up Recommendations SNF;LTACH    Equipment Recommendations       Recommendations for Other Services       Precautions / Restrictions Precautions Precautions: Fall Restrictions Weight Bearing Restrictions: No      Mobility  Bed Mobility Overal bed mobility: Needs Assistance Bed Mobility: Rolling Rolling: Total assist         General bed mobility comments: dep assist; unable to fully unweight side or complete rotation  Transfers                 General transfer comment: unsafe/unable to  tolerate at this time  Ambulation/Gait             General Gait Details: non-ambulatory at baseline  Stairs            Wheelchair Mobility    Modified Rankin (Stroke Patients Only)       Balance                                             Pertinent Vitals/Pain Pain Assessment: No/denies pain    Home Living Family/patient expects to be discharged to:: Private residence Living Arrangements: Spouse/significant other Available Help at Discharge: Family Type of Home: House       Home Layout: One level Home Equipment: Hospital bed;Walker - standard;Wheelchair - power (bedpan)      Prior Function Level of Independence: Needs assistance   Gait / Transfers Assistance Needed: Non-ambulatory, bed-level for all ADLs, toileting; able to assist with rolling, but requires assist from husband for all activities           Hand Dominance        Extremity/Trunk Assessment   Upper Extremity Assessment: Generalized weakness;Defer to OT evaluation (ROM grossly WFL, strength at least 3/5 throughout)           Lower Extremity Assessment: Generalized weakness (bilat hip strength 2+ to 3-/5, bilat knees NT (L fused, R not tolerated), bilat ankle PF/DF 4/5.  )         Communication   Communication: No difficulties  Cognition Arousal/Alertness: Awake/alert Behavior During Therapy: WFL for tasks assessed/performed Overall  Cognitive Status: Impaired/Different from baseline Area of Impairment: Orientation Orientation Level: Person;Place   Memory: Decreased short-term memory              General Comments      Exercises Other Exercises Other Exercises: Bilat LE supine therex, 1x10: ankle pumps, quad sets, hip abduct/adduct, SLR (act assist); bilat UE supine therex, 1x10: shoulder flex/ext, horiz abduct/adduct, rowing/scapular retraction. (8 minutes)      Assessment/Plan    PT Assessment Patient needs continued PT services  PT  Diagnosis Generalized weakness;Altered mental status   PT Problem List Decreased strength;Decreased range of motion;Decreased activity tolerance;Decreased mobility;Cardiopulmonary status limiting activity;Decreased safety awareness;Decreased knowledge of precautions;Decreased knowledge of use of DME;Obesity  PT Treatment Interventions DME instruction;Functional mobility training;Therapeutic activities;Therapeutic exercise;Cognitive remediation;Patient/family education   PT Goals (Current goals can be found in the Care Plan section) Acute Rehab PT Goals Patient Stated Goal: "I don't know if I can do much" PT Goal Formulation: With patient Time For Goal Achievement: 10/09/14 Potential to Achieve Goals: Fair    Frequency Min 2X/week   Barriers to discharge Decreased caregiver support;Inaccessible home environment      Co-evaluation               End of Session   Activity Tolerance: Patient limited by fatigue Patient left: in bed;with call bell/phone within reach           Time: 1610-9604 PT Time Calculation (min) (ACUTE ONLY): 26 min   Charges:   PT Evaluation $Initial PT Evaluation Tier I: 1 Procedure PT Treatments $Therapeutic Exercise: 8-22 mins   PT G Codes:       Elorah Dewing H. Manson Passey, PT, DPT, NCS 09/25/2014, 3:32 PM [phone]
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
## 2014-09-25 NOTE — Progress Notes (Signed)
Step up bowel regimen therapy - discontinued scheduled docusate and PRN senna-S, ordered scheduled Senna-S BID. Will continue to follow and progress via protocol as needed for bowel movement.   Orlando PennerN. Kahley Leib, Pharm.D. Clinical Pharmacist [phone]

## 2014-09-25 NOTE — Progress Notes (Deleted)
Pt became increasingly anxious, stating "I'm so sick, I can't take this, my nerves are so bad". Pt requested medication for anxiety, per Dr. Amado CoeGouru, resume pt home dose of Ativan 1mg  q 8hours.

## 2014-09-26 LAB — GLUCOSE, CAPILLARY
GLUCOSE-CAPILLARY: 149 mg/dL — AB (ref 65–99)
GLUCOSE-CAPILLARY: 159 mg/dL — AB (ref 65–99)
Glucose-Capillary: 159 mg/dL — ABNORMAL HIGH (ref 65–99)
Glucose-Capillary: 159 mg/dL — ABNORMAL HIGH (ref 65–99)
Glucose-Capillary: 180 mg/dL — ABNORMAL HIGH (ref 65–99)

## 2014-09-26 LAB — THEOPHYLLINE LEVEL: Theophylline Lvl: 7.3 — ABNORMAL LOW (ref 8.0–20.0)

## 2014-09-26 LAB — MAGNESIUM: Magnesium: 1.8 mg/dL (ref 1.7–2.4)

## 2014-09-26 LAB — VANCOMYCIN, TROUGH: Vancomycin Tr: 25 ug/mL (ref 10–20)

## 2014-09-26 LAB — BASIC METABOLIC PANEL
ANION GAP: 9 (ref 5–15)
BUN: 32 mg/dL — AB (ref 6–20)
CHLORIDE: 98 mmol/L — AB (ref 101–111)
CO2: 25 mmol/L (ref 22–32)
Calcium: 7.9 mg/dL — ABNORMAL LOW (ref 8.9–10.3)
Creatinine, Ser: 0.74 mg/dL (ref 0.44–1.00)
GFR calc non Af Amer: >60 mL/min (ref >60)
GLUCOSE: 188 mg/dL — AB (ref 65–99)
Potassium: 4 mmol/L (ref 3.5–5.1)
Sodium: 132 mmol/L — ABNORMAL LOW (ref 135–145)

## 2014-09-26 LAB — PHOSPHORUS: PHOSPHORUS: 3.1 mg/dL (ref 2.5–4.6)

## 2014-09-26 MED ORDER — LACTULOSE 10 GM/15ML PO SOLN
30.0000 g | Freq: Two times a day (BID) | ORAL | Status: DC
  Administered 2014-09-26 – 2014-09-29 (×7): 30 g via ORAL
  Filled 2014-09-26 (×7): qty 60

## 2014-09-26 MED ORDER — VANCOMYCIN HCL 10 G IV SOLR
1500.0000 mg | INTRAVENOUS | Status: DC
  Administered 2014-09-26 – 2014-09-30 (×5): 1500 mg via INTRAVENOUS
  Filled 2014-09-26 (×7): qty 1500

## 2014-09-26 NOTE — Clinical Documentation Improvement (Signed)
  Please clarify in progress notes and Discharge Summary if MRSA pneumonia was present on admission.    Present on admission Not present on admission Unable to further specify Unknown  Thank you!  Sharyn Creameronna Jeran Hiltz, BSN, RN  HIM/Clinical Documentation Specialist [email or web address] [phone]

## 2014-09-26 NOTE — Therapy (Signed)
BiPap removed, patient placed on HHFNC at FiO2 .40, Flow 45 L. Patient awake, oriented to time and place.

## 2014-09-26 NOTE — Progress Notes (Signed)
Emailed Infection Control Specialist Jason FilaSara Wall yesterday about patient being on both Droplet and Contact precautions for MRSA in her sputum. Per review of [place] and CDC policies and her response, on Contact is required for precautions. Droplet she stated was up to the RN discretion based on risk of patient coughing in staff's face. Patient not coughing, have just discontinued Droplet precautions.

## 2014-09-26 NOTE — Progress Notes (Signed)
ANTIBIOTIC CONSULT NOTE - FOLLOW UP   Pharmacy Consult for Vancomycin Indication: rule out pneumonia  Allergies  Allergen Reactions  . Other Other (See Comments)    darvocet--pt reports blistering with this medication  . Penicillins Nausea And Vomiting  . Darvon [Propoxyphene] Other (See Comments)    Blisters    Patient Measurements: Height:  (149.9 cm) Weight: (!) 427 lb 11.1 oz (194 kg) IBW/kg (Calculated) : 43.2 Adjusted Body Weight: 102.5 kg  Vital Signs: Temp: 98.5 F (36.9 C) (07/20 0700) Temp Source: Axillary (07/20 0700) BP: 134/77 mmHg (07/20 0600) Pulse Rate: 67 (07/20 0600) Intake/Output from previous day: 07/19 0701 - 07/20 0700 In: 980 [P.O.:480; IV Piggyback:500] Out: 2175 [Urine:2175] Intake/Output from this shift: Total I/O In: 60 [P.O.:60] Out: -   Labs:  Recent Labs  09/24/14 0442 09/25/14 0445 09/26/14 0938  WBC  --  5.5  --   HGB  --  10.5*  --   PLT  --  111*  --   CREATININE 0.67 0.78 0.74   Estimated Creatinine Clearance: 106.9 mL/min (by C-G formula based on Cr of 0.74).  Recent Labs  09/23/14 1444 09/26/14 0937  VANCOTROUGH 27* 25*     Microbiology: Recent Results (from the past 720 hour(s))  MRSA PCR Screening     Status: None   Collection Time: 09/14/14  7:50 PM  Result Value Ref Range Status   MRSA by PCR NEGATIVE NEGATIVE Final    Comment:        The GeneXpert MRSA Assay (FDA approved for NASAL specimens only), is one component of a comprehensive MRSA colonization surveillance program. It is not intended to diagnose MRSA infection nor to guide or monitor treatment for MRSA infections.   Culture, expectorated sputum-assessment     Status: None   Collection Time: 09/18/14  6:15 PM  Result Value Ref Range Status   Specimen Description SPUTUM  Final   Special Requests NONE  Final   Sputum evaluation THIS SPECIMEN IS ACCEPTABLE FOR SPUTUM CULTURE  Final   Report Status 09/19/2014 FINAL  Final  Culture,  respiratory (NON-Expectorated)     Status: None   Collection Time: 09/18/14  6:15 PM  Result Value Ref Range Status   Specimen Description SPUTUM  Final   Special Requests NONE Reflexed from [id number]  Final   Gram Stain   Final    FAIR SPECIMEN - 70-80% WBCS FEW WBC SEEN RARE GRAM POSITIVE COCCI IN CLUSTERS    Culture   Final    HEAVY GROWTH METHICILLIN RESISTANT STAPHYLOCOCCUS AUREUS CRITICAL RESULT CALLED TO, READ BACK BY AND VERIFIED WITH: PAM MEYERS AT 1218 09/21/14 DV    Report Status 09/21/2014 FINAL  Final   Organism ID, Bacteria METHICILLIN RESISTANT STAPHYLOCOCCUS AUREUS  Final      Susceptibility   Methicillin resistant staphylococcus aureus - MIC*    CIPROFLOXACIN 2 INTERMEDIATE Intermediate     GENTAMICIN <=0.5 SENSITIVE Sensitive     OXACILLIN >=4 RESISTANT Resistant     VANCOMYCIN 1 SENSITIVE Sensitive     TRIMETH/SULFA <=10 SENSITIVE Sensitive     CEFOXITIN SCREEN Value in next row Resistant      POSITIVECEFOXITIN SCREEN - This test may be used to predict mecA-mediated oxacillin resistance, and it is based on the cefoxitin disk screen test.  The cefoxitin screen and oxacillin work in combination to determine the final interpretation reported for oxacillin.     Inducible Clindamycin Value in next row Resistant  POSITIVEINDUCIBLE CLINDAMYCIN RESISTANCE - A positive ICR test is indicative of inducible resistance to macrolides, lincosamides, and type B streptogramin.  This isolate is presumed to be resistant to Clindamycin, however, Clindamycin may still be effective in some patients.     TETRACYCLINE Value in next row Sensitive      SENSITIVE<=1    * HEAVY GROWTH METHICILLIN RESISTANT STAPHYLOCOCCUS AUREUS    Medical History: Past Medical History  Diagnosis Date  . COPD (chronic obstructive pulmonary disease)   . Arthritis     Medications:  Anti-infectives    Start     Dose/Rate Route Frequency Ordered Stop   09/26/14 1800  vancomycin (VANCOCIN) 1,500 mg in  sodium chloride 0.9 % 500 mL IVPB     1,500 mg 250 mL/hr over 120 Minutes Intravenous Every 24 hours 09/26/14 1048     09/24/14 1800  vancomycin (VANCOCIN) 1,500 mg in sodium chloride 0.9 % 500 mL IVPB  Status:  Discontinued     1,500 mg 250 mL/hr over 120 Minutes Intravenous Every 18 hours 09/23/14 1554 09/26/14 1048   09/23/14 2130  vancomycin (VANCOCIN) 1,500 mg in sodium chloride 0.9 % 500 mL IVPB  Status:  Discontinued     1,500 mg 250 mL/hr over 120 Minutes Intravenous Every 18 hours 09/23/14 1532 09/23/14 1533   09/23/14 1600  vancomycin (VANCOCIN) 1,500 mg in sodium chloride 0.9 % 500 mL IVPB  Status:  Discontinued     1,500 mg 250 mL/hr over 120 Minutes Intravenous Every 18 hours 09/23/14 1533 09/23/14 1554   09/21/14 1500  vancomycin (VANCOCIN) 1,500 mg in sodium chloride 0.9 % 500 mL IVPB  Status:  Discontinued     1,500 mg 250 mL/hr over 120 Minutes Intravenous Every 12 hours 09/21/14 1426 09/23/14 1532   09/17/14 1200  azithromycin (ZITHROMAX) 500 mg in dextrose 5 % 250 mL IVPB  Status:  Discontinued     500 mg 250 mL/hr over 60 Minutes Intravenous Every 24 hours 09/17/14 1113 09/18/14 1154   09/15/14 1800  azithromycin (ZITHROMAX) 500 mg in dextrose 5 % 250 mL IVPB  Status:  Discontinued     500 mg 250 mL/hr over 60 Minutes Intravenous Every 24 hours 09/14/14 0345 09/15/14 1311   09/15/14 1800  azithromycin (ZITHROMAX) tablet 500 mg  Status:  Discontinued     500 mg Oral Daily 09/15/14 1311 09/17/14 1113   09/14/14 0145  azithromycin (ZITHROMAX) 500 mg in dextrose 5 % 250 mL IVPB     500 mg 250 mL/hr over 60 Minutes Intravenous  Once 09/14/14 0133 09/14/14 0302     Assessment: Patient with continued SOB at rest. Empirically adding Vancomycin for MRSA coverage. Patient currently on day 4 of vancomycin IV   7/20: Vancomycin level resulted @ 25 mcg/ml Goal of Therapy:  Vancomycin trough level 15-20 mcg/ml  Plan:  Will reduce dose to Vancomycin 1500 mg IV q24 hours  starting @ 1800. Will follow scr and will order Vancomycin trough level to be drawn @ 1730 on 7/22   Demetrius Charityeldrin D. Meriah Shands, PharmD   09/26/2014 10:51 AM

## 2014-09-26 NOTE — Progress Notes (Signed)
[facility] - [place] at [facility]   PATIENT NAME: Eileen [name]    MR#:  [id number]  DATE OF BIRTH:  [birth year]  SUBJECTIVE:  CHIEF COMPLAINT:   Chief Complaint  Patient presents with  . Shortness of Breath   Overall feels better.  No complaints.  Shortness of breath much improved. FiO2 down to 40% on [employer] [place].   REVIEW OF SYSTEMS:   Review of Systems  Constitutional: Negative for fever and chills.  HENT: Negative for congestion and tinnitus.   Eyes: Negative for blurred vision and double vision.  Respiratory: Positive for shortness of breath (improved). Negative for cough and wheezing.   Cardiovascular: Negative for chest pain, orthopnea and PND.  Gastrointestinal: Negative for nausea, vomiting, abdominal pain and diarrhea.  Genitourinary: Negative for dysuria and hematuria.  Neurological: Negative for dizziness, sensory change and focal weakness.  All other systems reviewed and are negative.   DRUG ALLERGIES:   Allergies  Allergen Reactions  . Other Other (See Comments)    darvocet--pt reports blistering with this medication  . Penicillins Nausea And Vomiting  . Darvon [Propoxyphene] Other (See Comments)    Blisters    VITALS:  Blood pressure 134/77, pulse 67, temperature 98.5 F (36.9 C), temperature source Axillary, resp. rate 13, height  (1.499 m), weight 194 kg (427 lb 11.1 oz), SpO2 96 %.  PHYSICAL EXAMINATION:   GENERAL:  69 y.o.-year-old morbidly obsese patient lying in the bed in no apparent distress. EYES: Pupils equal, round, reactive to light and accommodation. No scleral icterus. Extraocular muscles intact.  HEENT: Head atraumatic, normocephalic. Oropharynx and nasopharynx clear.  On [place].  Most oral mucosa NECK:  Supple, no jugular venous distention. No thyroid enlargement, no tenderness.  LUNGS: No dullness to percussion. Negative use of accessory muscles. No rales or rhonchi noted wheezing  anteriorly. CARDIOVASCULAR: S1, S2 normal. No murmurs, rubs, or gallops.  ABDOMEN: Soft, nontender, nondistended. Bowel sounds present. No organomegaly or mass.  EXTREMITIES: 1+ pedal edema, No cyanosis, or clubbing.  NEUROLOGIC: Cranial nerves II through XII are intact. No focal motor or sensory deficits bilaterally. Globally weak.  PSYCHIATRIC: The patient is alert and oriented x 3. Globally weak  SKIN: No obvious rash, lesion, or ulcer.   Foley catheter in place draining clear urine.    LABORATORY PANEL:   CBC  Recent Labs Lab 09/25/14 0445  WBC 5.5  HGB 10.5*  HCT 32.1*  PLT 111*   ------------------------------------------------------------------------------------------------------------------  Chemistries   Recent Labs Lab 09/26/14 0938  NA 132*  K 4.0  CL 98*  CO2 25  GLUCOSE 188*  BUN 32*  CREATININE 0.74  CALCIUM 7.9*  MG 1.8   ------------------------------------------------------------------------------------------------------------------  Cardiac Enzymes No results for input(s): TROPONINI in the last 168 hours. ------------------------------------------------------------------------------------------------------------------  RADIOLOGY:  No results found.   ASSESSMENT AND PLAN:   69 year old female with past medical history of morbid obesity, obstructive sleep apnea, COPD, osteoarthritis, obesity pickwickian syndrome, chronic atrial fibrillation who presented to the hospital due to shortness of breath and noted to be in acute on chronic respiratory failure.  #1 acute on chronic hypoxic hypercarbic respiratory failure-this is likely secondary to patient's obesity pickwickian syndrome complicated with sleep apnea and COPD exacerbation/MRSA pneumonia. -Continue IV steroids but cont. To taper, cont. DuoNeb nebs around-the-clock, Spiriva, and Vancomycin. -Continue BiPAP with naps and at night. Pulmonary following and agrees with management.  #2 [facility]  [facility] -Continue IV steroids but cont. To taper, cont.  DuoNeb's, Spiriva, Pulmicort -Continue IV vancomycin for MRSA pneumonia. -Continue BiPAP support.  #3 chronic afibrillation-rate controlled.   -Continue Eliquis  #4 MRSA pneumonia-continue vancomycin. - ?? Switch to oral Bactrim/Zyvox and discussed w/ pharmacy. - needs a total of 2 weeks of therapy.    #5 depression-continue Zoloft.  #6 morbid obesity with obesity pickwickian syndrome-continue BiPAP support.  #7 thrombocytopenia-seems chronic for the patient. Etiology unclear. -No acute bleeding, avoid heparin products. Follow-up with hematology as outpatient.  #8 hyperglycemia-likely secondary to steroids. Improved. - cont. To taper steroids. Continue sliding scale insulin, Lantus.   Seen by PT and they recommend SNF and CM aware.  D/c either home w/ Hospice or SNF w/ hospice.    All the records are reviewed and case discussed with Care Management/Social Workerr. Management plans discussed with the patient, family and they are in agreement.  CODE STATUS: DO NOT RESUSCITATE  TOTAL TIME TAKING CARE OF THIS PATIENT: 30 minutes.    [place] SirenSAINANI,Macaulay Reicher J M.D on 09/26/2014 at 10:42 AM  Between 7am to 6pm - Pager - [phone]  After 6pm go to [email or web address] - password [email or web address]  [facility]  Office  [phone]  CC: Primary care physician; Pcp Not In System

## 2014-09-26 NOTE — Progress Notes (Signed)
   09/26/14 1315  Clinical Encounter Type  Visited With Family  Visit Type Initial  Consult/Referral To Chaplain  Spiritual Encounters  Spiritual Needs Emotional  Chaplain spoke with the family and offered a compassionate presence and listening ear. Offered services as applicable. Chaplain Kasen Sako A. Beula Joyner Ext. [phone]

## 2014-09-26 NOTE — Progress Notes (Addendum)
PHARMACY - CRITICAL CARE PROGRESS NOTE  Pharmacy Consult for Electrolyte Replacement Indication: ICU patient   Allergies  Allergen Reactions  . Other Other (See Comments)    darvocet--pt reports blistering with this medication  . Penicillins Nausea And Vomiting  . Darvon [Propoxyphene] Other (See Comments)    Blisters    Patient Measurements: Height:  (149.9 cm) Weight: (!) 427 lb 11.1 oz (194 kg) IBW/kg (Calculated) : 43.2   Vital Signs: Temp: 98.5 F (36.9 C) (07/20 0700) Temp Source: Axillary (07/20 0700) BP: 134/77 mmHg (07/20 0600) Pulse Rate: 67 (07/20 0600) Intake/Output from previous day: 07/19 0701 - 07/20 0700 In: 980 [P.O.:480; IV Piggyback:500] Out: 2175 [Urine:2175] Intake/Output from this shift: Total I/O In: 60 [P.O.:60] Out: -  Vent settings for last 24 hours: Vent Mode:  [-]  FiO2 (%):  [40 %-60 %] 40 %  Labs:  Recent Labs  09/24/14 0442 09/25/14 0445 09/26/14 0938  WBC  --  5.5  --   HGB  --  10.5*  --   HCT  --  32.1*  --   PLT  --  111*  --   CREATININE 0.67 0.78 0.74  MG  --   --  1.8  PHOS  --   --  3.1   Estimated Creatinine Clearance: 106.9 mL/min (by C-G formula based on Cr of 0.74).   Recent Labs  09/25/14 1722 09/25/14 2216 09/26/14 0708  GLUCAP 284* 193* 149*    Microbiology: Recent Results (from the past 720 hour(s))  MRSA PCR Screening     Status: None   Collection Time: 09/14/14  7:50 PM  Result Value Ref Range Status   MRSA by PCR NEGATIVE NEGATIVE Final    Comment:        The GeneXpert MRSA Assay (FDA approved for NASAL specimens only), is one component of a comprehensive MRSA colonization surveillance program. It is not intended to diagnose MRSA infection nor to guide or monitor treatment for MRSA infections.   Culture, expectorated sputum-assessment     Status: None   Collection Time: 09/18/14  6:15 PM  Result Value Ref Range Status   Specimen Description SPUTUM  Final   Special Requests NONE   Final   Sputum evaluation THIS SPECIMEN IS ACCEPTABLE FOR SPUTUM CULTURE  Final   Report Status 09/19/2014 FINAL  Final  Culture, respiratory (NON-Expectorated)     Status: None   Collection Time: 09/18/14  6:15 PM  Result Value Ref Range Status   Specimen Description SPUTUM  Final   Special Requests NONE Reflexed from [id number]  Final   Gram Stain   Final    FAIR SPECIMEN - 70-80% WBCS FEW WBC SEEN RARE GRAM POSITIVE COCCI IN CLUSTERS    Culture   Final    HEAVY GROWTH METHICILLIN RESISTANT STAPHYLOCOCCUS AUREUS CRITICAL RESULT CALLED TO, READ BACK BY AND VERIFIED WITH: PAM MEYERS AT 1218 09/21/14 DV    Report Status 09/21/2014 FINAL  Final   Organism ID, Bacteria METHICILLIN RESISTANT STAPHYLOCOCCUS AUREUS  Final      Susceptibility   Methicillin resistant staphylococcus aureus - MIC*    CIPROFLOXACIN 2 INTERMEDIATE Intermediate     GENTAMICIN <=0.5 SENSITIVE Sensitive     OXACILLIN >=4 RESISTANT Resistant     VANCOMYCIN 1 SENSITIVE Sensitive     TRIMETH/SULFA <=10 SENSITIVE Sensitive     CEFOXITIN SCREEN Value in next row Resistant      POSITIVECEFOXITIN SCREEN - This test may be used to predict mecA-mediated  oxacillin resistance, and it is based on the cefoxitin disk screen test.  The cefoxitin screen and oxacillin work in combination to determine the final interpretation reported for oxacillin.     Inducible Clindamycin Value in next row Resistant      POSITIVEINDUCIBLE CLINDAMYCIN RESISTANCE - A positive ICR test is indicative of inducible resistance to macrolides, lincosamides, and type B streptogramin.  This isolate is presumed to be resistant to Clindamycin, however, Clindamycin may still be effective in some patients.     TETRACYCLINE Value in next row Sensitive      SENSITIVE<=1    * HEAVY GROWTH METHICILLIN RESISTANT STAPHYLOCOCCUS AUREUS    Medications:  Scheduled:  . acetaZOLAMIDE  250 mg Oral BID  . antiseptic oral rinse  7 mL Mouth Rinse BID  . apixaban  5 mg Oral  BID  . budesonide (PULMICORT) nebulizer solution  0.5 mg Nebulization BID  . dextromethorphan  30 mg Oral BID  . guaiFENesin  600 mg Oral BID  . insulin aspart  0-20 Units Subcutaneous TID WC  . insulin aspart  0-5 Units Subcutaneous QHS  . insulin glargine  15 Units Subcutaneous QHS  . ipratropium-albuterol  3 mL Nebulization Q4H  . methylPREDNISolone (SOLU-MEDROL) injection  20 mg Intravenous Daily  . senna-docusate  1 tablet Oral BID  . sertraline  100 mg Oral Daily  . theophylline  200 mg Oral Q12H  . tiotropium  18 mcg Inhalation Daily  . traZODone  100 mg Oral QHS  . vancomycin  1,500 mg Intravenous Q18H   Infusions:    Assessment: All electrolytes are within normal limits.   Plan:  Will follow   Eileen [name] D 09/26/2014,10:33 AM

## 2014-09-26 NOTE — Progress Notes (Signed)
Pt alert and oriented. Vitals stable- afib on monitor-controlled- off high flow and now on 5 liters sating in 90's. No complaints of shortness of breath or pain. Foley in place- urine output adequate.  Started on lactulose due to no BM since 7/12- Dr. Quentin CornwallSanani made aware.

## 2014-09-27 LAB — CREATININE, SERUM
Creatinine, Ser: 0.69 mg/dL (ref 0.44–1.00)
GFR calc Af Amer: >60 mL/min (ref >60)
GFR calc non Af Amer: >60 mL/min (ref >60)

## 2014-09-27 LAB — GLUCOSE, CAPILLARY
Glucose-Capillary: 160 mg/dL — ABNORMAL HIGH (ref 65–99)
Glucose-Capillary: 144 mg/dL — ABNORMAL HIGH (ref 65–99)
Glucose-Capillary: 142 mg/dL — ABNORMAL HIGH (ref 65–99)
Glucose-Capillary: 265 mg/dL — ABNORMAL HIGH (ref 65–99)
Glucose-Capillary: 102 mg/dL — ABNORMAL HIGH (ref 65–99)

## 2014-09-27 MED ORDER — PREDNISONE 50 MG PO TABS
50.0000 mg | ORAL_TABLET | Freq: Every day | ORAL | Status: DC
  Administered 2014-09-28: 10:00:00 50 mg via ORAL
  Filled 2014-09-27: qty 1

## 2014-09-27 NOTE — Clinical Social Work Note (Signed)
Physician informed CSW that patient will need Bipap or Cpap when she sleeps. Physician is going to follow up with respiratory to determine which one. Referral will have to be resent out with this information once known. York Spaniel MSW,LCSW [phone]

## 2014-09-27 NOTE — Plan of Care (Signed)
Problem: ICU Phase Progression Outcomes Goal: Initial discharge plan identified Outcome: Progressing Plan cto transfer SNF tomorrow  Problem: Phase I Progression Outcomes Goal: O2 sats > or equal 90% or at baseline Outcome: Progressing sats good on 5l/[place].  Wears 45% bipap hs Goal: Dyspnea controlled at rest Outcome: Progressing No sHob Goal: Progress activity as tolerated unless otherwise ordered Outcome: Not Progressing Pt states bedridden x 2 yrs. Requires lift and max assist Goal: Discharge plan established Outcome: Progressing SNF at discharge Goal: Tolerating diet Outcome: Completed/Met Date Met:  09/27/14 Good appetite

## 2014-09-27 NOTE — Care Management Note (Signed)
Case Management Note  Patient Details  Name: SHIASIA PORRO MRN: [id number] Date of Birth: 1945-10-07  Subjective/Objective:   O2 weaned to 5L/[place]. She is using the Bipap at HS. Spoke with patient and she is not on Bipap or CPAP at home. It was stated in progression rounds that patient may be stable to discharged from ICU to SNF when bed available.  CSW updated. It is anticipated that patient will discharge to SNF.  Will need to update [facility] [place] if patient is discharged               Action/Plan: SNF  Expected Discharge Date:                  Expected Discharge Plan:  Skilled Nursing Facility  In-House Referral:  Clinical Social Work  Discharge planning Services  CM Consult  Post Acute Care Choice:    Choice offered to:  Patient  DME Arranged:    DME Agency:  [facility]  HH Arranged:    HH Agency:     Status of Service:  In process, will continue to follow  Medicare Important Message Given:  Yes-second notification given Date Medicare IM Given:    Medicare IM give by:    Date Additional Medicare IM Given:    Additional Medicare Important Message give by:     If discussed at Long Length of Stay Meetings, dates discussed:    Additional Comments:  Marily Memos, RN 09/27/2014, 8:59 AM

## 2014-09-27 NOTE — Plan of Care (Signed)
Problem: Discharge Progression Outcomes Goal: Other Discharge Outcomes/Goals Outcome: Progressing Plan of care progress: COPD exacerbation: -continues IV ABTs -Duo nebs continues  -IV solumedrol continues -dyspnea on exertion with improvement at rest, breathing techniques utilized -o2 in place at 5L  -tolerates diet -specialty bed -no complaints of pain, no distress or discomfort noted

## 2014-09-27 NOTE — Progress Notes (Signed)
ANTIBIOTIC CONSULT NOTE - FOLLOW UP   Pharmacy Consult for Vancomycin Indication: rule out pneumonia  Allergies  Allergen Reactions  . Other Other (See Comments)    darvocet--pt reports blistering with this medication  . Penicillins Nausea And Vomiting  . Darvon [Propoxyphene] Other (See Comments)    Blisters    Patient Measurements: Height:  (149.9 cm) Weight: (!) 425 lb 7.8 oz (193 kg) IBW/kg (Calculated) : 43.2 Adjusted Body Weight: 102.5 kg  Vital Signs: Temp: 97.9 F (36.6 C) (07/21 0800) Temp Source: Axillary (07/21 0800) BP: 107/94 mmHg (07/21 0800) Pulse Rate: 72 (07/21 0800) Intake/Output from previous day: 07/20 0701 - 07/21 0700 In: 620 [P.O.:120; IV Piggyback:500] Out: 2550 [Urine:2550] Intake/Output from this shift: Total I/O In: 85 [P.O.:85] Out: -   Labs:  Recent Labs  09/25/14 0445 09/26/14 0938 09/27/14 0513  WBC 5.5  --   --   HGB 10.5*  --   --   PLT 111*  --   --   CREATININE 0.78 0.74 0.69   Estimated Creatinine Clearance: 106.5 mL/min (by C-G formula based on Cr of 0.69).  Recent Labs  09/26/14 0937  VANCOTROUGH 25*     Microbiology: Recent Results (from the past 720 hour(s))  MRSA PCR Screening     Status: None   Collection Time: 09/14/14  7:50 PM  Result Value Ref Range Status   MRSA by PCR NEGATIVE NEGATIVE Final    Comment:        The GeneXpert MRSA Assay (FDA approved for NASAL specimens only), is one component of a comprehensive MRSA colonization surveillance program. It is not intended to diagnose MRSA infection nor to guide or monitor treatment for MRSA infections.   Culture, expectorated sputum-assessment     Status: None   Collection Time: 09/18/14  6:15 PM  Result Value Ref Range Status   Specimen Description SPUTUM  Final   Special Requests NONE  Final   Sputum evaluation THIS SPECIMEN IS ACCEPTABLE FOR SPUTUM CULTURE  Final   Report Status 09/19/2014 FINAL  Final  Culture, respiratory  (NON-Expectorated)     Status: None   Collection Time: 09/18/14  6:15 PM  Result Value Ref Range Status   Specimen Description SPUTUM  Final   Special Requests NONE Reflexed from [id number]  Final   Gram Stain   Final    FAIR SPECIMEN - 70-80% WBCS FEW WBC SEEN RARE GRAM POSITIVE COCCI IN CLUSTERS    Culture   Final    HEAVY GROWTH METHICILLIN RESISTANT STAPHYLOCOCCUS AUREUS CRITICAL RESULT CALLED TO, READ BACK BY AND VERIFIED WITH: PAM MEYERS AT 1218 09/21/14 DV    Report Status 09/21/2014 FINAL  Final   Organism ID, Bacteria METHICILLIN RESISTANT STAPHYLOCOCCUS AUREUS  Final      Susceptibility   Methicillin resistant staphylococcus aureus - MIC*    CIPROFLOXACIN 2 INTERMEDIATE Intermediate     GENTAMICIN <=0.5 SENSITIVE Sensitive     OXACILLIN >=4 RESISTANT Resistant     VANCOMYCIN 1 SENSITIVE Sensitive     TRIMETH/SULFA <=10 SENSITIVE Sensitive     CEFOXITIN SCREEN Value in next row Resistant      POSITIVECEFOXITIN SCREEN - This test may be used to predict mecA-mediated oxacillin resistance, and it is based on the cefoxitin disk screen test.  The cefoxitin screen and oxacillin work in combination to determine the final interpretation reported for oxacillin.     Inducible Clindamycin Value in next row Resistant      POSITIVEINDUCIBLE CLINDAMYCIN RESISTANCE -  A positive ICR test is indicative of inducible resistance to macrolides, lincosamides, and type B streptogramin.  This isolate is presumed to be resistant to Clindamycin, however, Clindamycin may still be effective in some patients.     TETRACYCLINE Value in next row Sensitive      SENSITIVE<=1    * HEAVY GROWTH METHICILLIN RESISTANT STAPHYLOCOCCUS AUREUS    Medical History: Past Medical History  Diagnosis Date  . COPD (chronic obstructive pulmonary disease)   . Arthritis     Medications:  Anti-infectives    Start     Dose/Rate Route Frequency Ordered Stop   09/26/14 1800  vancomycin (VANCOCIN) 1,500 mg in sodium chloride  0.9 % 500 mL IVPB     1,500 mg 250 mL/hr over 120 Minutes Intravenous Every 24 hours 09/26/14 1048     09/24/14 1800  vancomycin (VANCOCIN) 1,500 mg in sodium chloride 0.9 % 500 mL IVPB  Status:  Discontinued     1,500 mg 250 mL/hr over 120 Minutes Intravenous Every 18 hours 09/23/14 1554 09/26/14 1048   09/23/14 2130  vancomycin (VANCOCIN) 1,500 mg in sodium chloride 0.9 % 500 mL IVPB  Status:  Discontinued     1,500 mg 250 mL/hr over 120 Minutes Intravenous Every 18 hours 09/23/14 1532 09/23/14 1533   09/23/14 1600  vancomycin (VANCOCIN) 1,500 mg in sodium chloride 0.9 % 500 mL IVPB  Status:  Discontinued     1,500 mg 250 mL/hr over 120 Minutes Intravenous Every 18 hours 09/23/14 1533 09/23/14 1554   09/21/14 1500  vancomycin (VANCOCIN) 1,500 mg in sodium chloride 0.9 % 500 mL IVPB  Status:  Discontinued     1,500 mg 250 mL/hr over 120 Minutes Intravenous Every 12 hours 09/21/14 1426 09/23/14 1532   09/17/14 1200  azithromycin (ZITHROMAX) 500 mg in dextrose 5 % 250 mL IVPB  Status:  Discontinued     500 mg 250 mL/hr over 60 Minutes Intravenous Every 24 hours 09/17/14 1113 09/18/14 1154   09/15/14 1800  azithromycin (ZITHROMAX) 500 mg in dextrose 5 % 250 mL IVPB  Status:  Discontinued     500 mg 250 mL/hr over 60 Minutes Intravenous Every 24 hours 09/14/14 0345 09/15/14 1311   09/15/14 1800  azithromycin (ZITHROMAX) tablet 500 mg  Status:  Discontinued     500 mg Oral Daily 09/15/14 1311 09/17/14 1113   09/14/14 0145  azithromycin (ZITHROMAX) 500 mg in dextrose 5 % 250 mL IVPB     500 mg 250 mL/hr over 60 Minutes Intravenous  Once 09/14/14 0133 09/14/14 0302     Assessment: Patient with continued SOB at rest. Empirically adding Vancomycin for MRSA coverage. Patient currently on day 6 of vancomycin IV   7/20: Vancomycin level resulted @ 25 mcg/ml Goal of Therapy:  Vancomycin trough level 15-20 mcg/ml  Plan:  Will reduce dose to Vancomycin 1500 mg IV q24 hours starting @ 1800. Will  follow scr and will order Vancomycin trough level to be drawn @ 1730 on 7/22   Demetrius Charity, PharmD   09/27/2014 9:39 AM

## 2014-09-27 NOTE — Progress Notes (Signed)
PT Cancellation Note  Patient Details Name: Eileen [name] MRN: [id number] DOB: 02-25-1946   Cancelled Treatment:    Reason Eval/Treat Not Completed: Patient declined, no reason specified. Treatment attempted after learning pt just recently moved from ICU. Pt refuses despite gentle encouragement stating she is tired and doesn't feel like it today. Will attempt treatment at a later date.    Elsie Stain Bishop 09/27/2014, 2:44 PM

## 2014-09-27 NOTE — Clinical Social Work Note (Signed)
Patient now off high flow [place] and is on 5 liters. CSW has left a message updating Burna Mortimer at [facility] and bedsearch initiated. Patient only willing to consider placement if it is local. York Spaniel MSW,LCSW [phone]

## 2014-09-27 NOTE — Progress Notes (Addendum)
[facility] - [place] at [facility]   PATIENT NAME: Eileen [name]    MR#:  [id number]  DATE OF BIRTH:  17-May-1945  SUBJECTIVE:  CHIEF COMPLAINT:   Chief Complaint  Patient presents with  . Shortness of Breath   Feels much better.  Off [place] and down to 5 L [place] now.  Remains constipated.   REVIEW OF SYSTEMS:   Review of Systems  Constitutional: Negative for fever and chills.  HENT: Negative for congestion and tinnitus.   Eyes: Negative for blurred vision and double vision.  Respiratory: Positive for shortness of breath (improved). Negative for cough and wheezing.   Cardiovascular: Negative for chest pain, orthopnea and PND.  Gastrointestinal: Positive for constipation. Negative for nausea, vomiting, abdominal pain and diarrhea.  Genitourinary: Negative for dysuria and hematuria.  Neurological: Negative for dizziness, sensory change and focal weakness.  All other systems reviewed and are negative.   DRUG ALLERGIES:   Allergies  Allergen Reactions  . Other Other (See Comments)    darvocet--pt reports blistering with this medication  . Penicillins Nausea And Vomiting  . Darvon [Propoxyphene] Other (See Comments)    Blisters    VITALS:  Blood pressure 128/60, pulse 85, temperature 97.9 F (36.6 C), temperature source Axillary, resp. rate 20, height  (1.499 m), weight 193 kg (425 lb 7.8 oz), SpO2 95 %.  PHYSICAL EXAMINATION:   GENERAL:  69 y.o.-year-old morbidly obsese patient lying in the bed in no apparent distress. EYES: Pupils equal, round, reactive to light and accommodation. No scleral icterus. Extraocular muscles intact.  HEENT: Head atraumatic, normocephalic. Oropharynx and nasopharynx clear. Most oral mucosa NECK:  Supple, no jugular venous distention. No thyroid enlargement, no tenderness.  LUNGS: No dullness to percussion. Negative use of accessory muscles. No rales or rhonchi noted wheezing anteriorly. CARDIOVASCULAR: S1, S2  normal. No murmurs, rubs, or gallops.  ABDOMEN: Soft, nontender, nondistended. Bowel sounds present. No organomegaly or mass.  EXTREMITIES: 1+ pedal edema, No cyanosis, or clubbing.  NEUROLOGIC: Cranial nerves II through XII are intact. No focal motor or sensory deficits bilaterally. Globally weak.  PSYCHIATRIC: The patient is alert and oriented x 3. Globally weak  SKIN: No obvious rash, lesion, or ulcer.   Foley catheter in place draining clear urine.    LABORATORY PANEL:   CBC  Recent Labs Lab 09/25/14 0445  WBC 5.5  HGB 10.5*  HCT 32.1*  PLT 111*   ------------------------------------------------------------------------------------------------------------------  Chemistries   Recent Labs Lab 09/26/14 0938 09/27/14 0513  NA 132*  --   K 4.0  --   CL 98*  --   CO2 25  --   GLUCOSE 188*  --   BUN 32*  --   CREATININE 0.74 0.69  CALCIUM 7.9*  --   MG 1.8  --    ------------------------------------------------------------------------------------------------------------------  Cardiac Enzymes No results for input(s): TROPONINI in the last 168 hours. ------------------------------------------------------------------------------------------------------------------  RADIOLOGY:  No results found.   ASSESSMENT AND PLAN:   70 year old female with past medical history of morbid obesity, obstructive sleep apnea, COPD, osteoarthritis, obesity pickwickian syndrome, chronic atrial fibrillation who presented to the hospital due to shortness of breath and noted to be in acute on chronic respiratory failure.  #1 acute on chronic hypoxic hypercarbic respiratory failure-this is likely secondary to patient's obesity pickwickian syndrome complicated with sleep apnea and COPD exacerbation/MRSA pneumonia. -We will switch from IV steroids to oral prednisone today, cont. DuoNeb nebs around-the-clock, Spiriva, and Vancomycin.  -Continue BiPAP with  naps and at night. Pulmonary following    #2 COPD exacerbation-likely secondary to underlying pneumonia. -Switch to oral prednisone today, cont. DuoNeb's, Spiriva, Pulmicort -on IV vancomycin for MRSA pneumonia and can switch to oral Bactrim upon discharge. -Continue BiPAP support.  #3 chronic afibrillation-rate controlled.   -Continue Eliquis  #4 MRSA pneumonia-likely present on admission and the cause of her COPD exacerbation and acute respiratory failure. -Continue IV vancomycin and can switch to oral Bactrim upon discharge. - needs a total of 2 weeks of therapy.  Clinically much improved  #5 depression-continue Zoloft.  #6 morbid obesity with obesity pickwickian syndrome-continue BiPAP support.  #7 thrombocytopenia-seems chronic for the patient. Etiology unclear. -No acute bleeding, avoid heparin products. Follow-up with hematology as outpatient.  #8 hyperglycemia-likely secondary to steroids. Improved. - cont. To taper steroids. Continue sliding scale insulin, Lantus.   #9 constipation-likely to due to patient being bedbound. No abdominal pain, nausea, vomiting. -Continue lactulose, Senokot. If needed will consider suppository or an enema  Seen by PT and they recommend SNF and CM aware.  D/c either home w/ Hospice or SNF w/ hospice.    All the records are reviewed and case discussed with Care Management/Social Workerr. Management plans discussed with the patient, family and they are in agreement.  CODE STATUS: DO NOT RESUSCITATE  DVT Prophylaxis - Eliquis.   TOTAL TIME TAKING CARE OF THIS PATIENT: 30 minutes.  Possible DC to skilled nursing facility in the next 1-2 days depending on progress    Houston Siren M.D on 09/27/2014 at 11:20 AM  Between 7am to 6pm - Pager - [phone]  After 6pm go to [email or web address] - password [email or web address]  [facility]  Office  [phone]  CC: Primary care physician; Pcp Not In System

## 2014-09-27 NOTE — Progress Notes (Signed)
Transferred per bed to room 122.  Husband aware of new location.  Report was called prior to transfer.

## 2014-09-28 LAB — GLUCOSE, CAPILLARY
GLUCOSE-CAPILLARY: 182 mg/dL — AB (ref 65–99)
GLUCOSE-CAPILLARY: 186 mg/dL — AB (ref 65–99)
Glucose-Capillary: 97 mg/dL (ref 65–99)
Glucose-Capillary: 203 mg/dL — ABNORMAL HIGH (ref 65–99)

## 2014-09-28 LAB — MAGNESIUM: Magnesium: 1.9 mg/dL (ref 1.7–2.4)

## 2014-09-28 LAB — BASIC METABOLIC PANEL
Anion gap: 4 — ABNORMAL LOW (ref 5–15)
BUN: 23 mg/dL — AB (ref 6–20)
CHLORIDE: 105 mmol/L (ref 101–111)
CO2: 27 mmol/L (ref 22–32)
CREATININE: 0.7 mg/dL (ref 0.44–1.00)
Calcium: 8 mg/dL — ABNORMAL LOW (ref 8.9–10.3)
GFR calc Af Amer: >60 mL/min (ref >60)
GFR calc non Af Amer: >60 mL/min (ref >60)
Glucose, Bld: 126 mg/dL — ABNORMAL HIGH (ref 65–99)
Potassium: 4 mmol/L (ref 3.5–5.1)
Sodium: 136 mmol/L (ref 135–145)

## 2014-09-28 LAB — PHOSPHORUS: Phosphorus: 3.9 mg/dL (ref 2.5–4.6)

## 2014-09-28 MED ORDER — PREDNISONE 20 MG PO TABS
40.0000 mg | ORAL_TABLET | Freq: Every day | ORAL | Status: DC
  Administered 2014-09-29: 40 mg via ORAL
  Filled 2014-09-28: qty 2

## 2014-09-28 MED ORDER — INSULIN GLARGINE 100 UNIT/ML ~~LOC~~ SOLN
7.0000 [IU] | Freq: Once | SUBCUTANEOUS | Status: AC
  Administered 2014-09-28: 01:00:00 7 [IU] via SUBCUTANEOUS
  Filled 2014-09-28: qty 0.07

## 2014-09-28 MED ORDER — IPRATROPIUM-ALBUTEROL 0.5-2.5 (3) MG/3ML IN SOLN
3.0000 mL | Freq: Four times a day (QID) | RESPIRATORY_TRACT | Status: DC
  Administered 2014-09-28 – 2014-09-29 (×2): 3 mL via RESPIRATORY_TRACT
  Filled 2014-09-28 (×3): qty 3

## 2014-09-28 MED ORDER — BISACODYL 10 MG RE SUPP
10.0000 mg | Freq: Every day | RECTAL | Status: DC | PRN
  Administered 2014-09-28: 15:00:00 10 mg via RECTAL
  Filled 2014-09-28 (×2): qty 1

## 2014-09-28 NOTE — Progress Notes (Signed)
Notified Dr Sheryle Hail that CBG 102 and Lantus 15units ordered. Do you want to hold insulin? MD verbalized to give Lantus 7 units.

## 2014-09-28 NOTE — Plan of Care (Signed)
Problem: Discharge Progression Outcomes Goal: Other Discharge Outcomes/Goals Outcome: Progressing Patient had no reports of shortness of breath this shift Patient continues on 5l o2 with sats the mid 90's  Patient had no c/o pain this shift, she did have some stomach cramps after suppository and had several bowels movements but declined further intervention for cramps Patient is tolerating diet well  VSS   PT worked with patient in bed today and will closely monitor participation/progress and reassess need for/benefit from skilled PT as appropriate

## 2014-09-28 NOTE — Consult Note (Signed)
[facility] Consult   Reason for Consult:  Follow-up consult for this 69 year old woman with a history of depression currently in the hospital for respiratory failure Referring Physician:  Verdell Carmine Patient Identification: Eileen [name] MRN:  [id number] Principal Diagnosis: Acute on chronic respiratory failure with hypoxia Diagnosis:   Patient Active Problem List   Diagnosis Date Noted  . Acute delirium [R41.0] 09/17/2014  . Acute on chronic respiratory failure with hypoxia [J96.21] 09/14/2014  . COPD exacerbation [J44.1] 09/14/2014  . Pressure ulcer [L89.90] 09/14/2014    Total Time spent with patient: 30 minutes  Subjective:   Eileen [name] is a 69 y.o. female patient admitted with patient was admitted with respiratory failure. Earlier had symptoms of depression. Seems to be improving.Marland Kitchen  HPI:  Patient seen for follow-up today. Chart reviewed. Mood is stated as still being good. Optimistic. No psychotic symptoms no suicidal or homicidal ideation. Patient's affect is normal. Speech is normal given her physical limitations. No complaints about any side effects from medication. HPI Elements:   Quality:  Mood with a history of depression. Severity:  Currently mild to moderate. Timing:  Getting better throughout the hospital stay. Duration:  Chronic problem. Context:  Severe medical problems.  Past Medical History:  Past Medical History  Diagnosis Date  . COPD (chronic obstructive pulmonary disease)   . Arthritis     Past Surgical History  Procedure Laterality Date  . Laminectomy    . Back surgery     Family History:  Family History  Problem Relation Age of Onset  . CAD Mother   . Diabetes type II Mother   . Hypertension Father   . CAD Father    Social History:  History  Alcohol Use No     History  Drug Use No    History   Social History  . Marital Status: Married    Spouse Name: N/A  . Number of Children: N/A  . Years of Education: N/A    Social History Main Topics  . Smoking status: Never Smoker   . Smokeless tobacco: Never Used  . Alcohol Use: No  . Drug Use: No  . Sexual Activity: Not on file   Other Topics Concern  . None   Social History Narrative  . None   Additional Social History:                          Allergies:   Allergies  Allergen Reactions  . Other Other (See Comments)    darvocet--pt reports blistering with this medication  . Penicillins Nausea And Vomiting  . Darvon [Propoxyphene] Other (See Comments)    Blisters    Labs:  Results for orders placed or performed during the hospital encounter of 09/14/14 (from the past 48 hour(s))  Glucose, capillary     Status: Abnormal   Collection Time: 09/26/14  4:11 PM  Result Value Ref Range   Glucose-Capillary 180 (H) 65 - 99 mg/dL  Glucose, capillary     Status: Abnormal   Collection Time: 09/26/14 10:36 PM  Result Value Ref Range   Glucose-Capillary 160 (H) 65 - 99 mg/dL  Creatinine, serum     Status: None   Collection Time: 09/27/14  5:13 AM  Result Value Ref Range   Creatinine, Ser 0.69 0.44 - 1.00 mg/dL   GFR calc non Af Amer >60 >60 mL/min   GFR calc Af Amer >60 >60 mL/min    Comment: (NOTE) The  eGFR has been calculated using the CKD EPI equation. This calculation has not been validated in all clinical situations. eGFR's persistently <60 mL/min signify possible Chronic Kidney Disease.   Glucose, capillary     Status: Abnormal   Collection Time: 09/27/14  7:20 AM  Result Value Ref Range   Glucose-Capillary 144 (H) 65 - 99 mg/dL  Glucose, capillary     Status: Abnormal   Collection Time: 09/27/14 11:36 AM  Result Value Ref Range   Glucose-Capillary 142 (H) 65 - 99 mg/dL  Glucose, capillary     Status: Abnormal   Collection Time: 09/27/14  4:26 PM  Result Value Ref Range   Glucose-Capillary 265 (H) 65 - 99 mg/dL  Glucose, capillary     Status: Abnormal   Collection Time: 09/27/14 11:29 PM  Result Value Ref Range    Glucose-Capillary 102 (H) 65 - 99 mg/dL   Comment 1 Notify RN   Basic metabolic panel     Status: Abnormal   Collection Time: 09/28/14  5:15 AM  Result Value Ref Range   Sodium 136 135 - 145 mmol/L   Potassium 4.0 3.5 - 5.1 mmol/L    Comment: HEMOLYSIS AT THIS LEVEL MAY AFFECT RESULT   Chloride 105 101 - 111 mmol/L   CO2 27 22 - 32 mmol/L   Glucose, Bld 126 (H) 65 - 99 mg/dL   BUN 23 (H) 6 - 20 mg/dL   Creatinine, Ser 0.70 0.44 - 1.00 mg/dL   Calcium 8.0 (L) 8.9 - 10.3 mg/dL   GFR calc non Af Amer >60 >60 mL/min   GFR calc Af Amer >60 >60 mL/min    Comment: (NOTE) The eGFR has been calculated using the CKD EPI equation. This calculation has not been validated in all clinical situations. eGFR's persistently <60 mL/min signify possible Chronic Kidney Disease.    Anion gap 4 (L) 5 - 15  Magnesium     Status: None   Collection Time: 09/28/14  5:15 AM  Result Value Ref Range   Magnesium 1.9 1.7 - 2.4 mg/dL  Phosphorus     Status: None   Collection Time: 09/28/14  5:15 AM  Result Value Ref Range   Phosphorus 3.9 2.5 - 4.6 mg/dL  Glucose, capillary     Status: None   Collection Time: 09/28/14  7:11 AM  Result Value Ref Range   Glucose-Capillary 97 65 - 99 mg/dL   Comment 1 Notify RN   Glucose, capillary     Status: Abnormal   Collection Time: 09/28/14 11:17 AM  Result Value Ref Range   Glucose-Capillary 182 (H) 65 - 99 mg/dL   Comment 1 Notify RN     Vitals: Blood pressure 131/55, pulse 81, temperature 97.7 F (36.5 C), temperature source Oral, resp. rate 18, height 4' 11"  (1.499 m), weight 193 kg (425 lb 7.8 oz), SpO2 95 %.  Risk to Self: Is patient at risk for suicide?: No Risk to Others:   Prior Inpatient Therapy:   Prior Outpatient Therapy:    Current Facility-Administered Medications  Medication Dose Route Frequency Provider Last Rate Last Dose  . acetaminophen (TYLENOL) tablet 650 mg  650 mg Oral Q6H PRN Juluis Mire, MD   650 mg at 09/25/14 1318   Or  .  acetaminophen (TYLENOL) suppository 650 mg  650 mg Rectal Q6H PRN Juluis Mire, MD      . acetaZOLAMIDE (DIAMOX) tablet 250 mg  250 mg Oral BID Flora Lipps, MD   250 mg  at 09/28/14 0930  . antiseptic oral rinse (CPC / CETYLPYRIDINIUM CHLORIDE 0.05%) solution 7 mL  7 mL Mouth Rinse BID Loletha Grayer, MD   7 mL at 09/28/14 1000  . apixaban (ELIQUIS) tablet 5 mg  5 mg Oral BID Henreitta Leber, MD   5 mg at 09/28/14 0930  . bisacodyl (DULCOLAX) suppository 10 mg  10 mg Rectal Daily PRN Henreitta Leber, MD   10 mg at 09/28/14 1437  . budesonide (PULMICORT) nebulizer solution 0.5 mg  0.5 mg Nebulization BID Vishal Mungal, MD   0.5 mg at 09/28/14 0825  . guaiFENesin (MUCINEX) 12 hr tablet 600 mg  600 mg Oral BID Loletha Grayer, MD   600 mg at 09/28/14 0930  . insulin aspart (novoLOG) injection 0-20 Units  0-20 Units Subcutaneous TID WC Loletha Grayer, MD   4 Units at 09/28/14 1205  . insulin aspart (novoLOG) injection 0-5 Units  0-5 Units Subcutaneous QHS Loletha Grayer, MD   3 Units at 09/24/14 2249  . insulin glargine (LANTUS) injection 15 Units  15 Units Subcutaneous QHS Henreitta Leber, MD   15 Units at 09/26/14 2243  . ipratropium-albuterol (DUONEB) 0.5-2.5 (3) MG/3ML nebulizer solution 3 mL  3 mL Nebulization Q6H Henreitta Leber, MD   3 mL at 09/28/14 1350  . lactulose (CHRONULAC) 10 GM/15ML solution 30 g  30 g Oral BID Henreitta Leber, MD   30 g at 09/28/14 0931  . LORazepam (ATIVAN) injection 0.5 mg  0.5 mg Intravenous Q6H PRN Loletha Grayer, MD   0.5 mg at 09/21/14 1329  . morphine CONCENTRATE 10 MG/0.5ML oral solution 10 mg  10 mg Oral Q2H PRN Loletha Grayer, MD   10 mg at 09/20/14 5597  . ondansetron (ZOFRAN) tablet 4 mg  4 mg Oral Q6H PRN Juluis Mire, MD   4 mg at 09/25/14 1941   Or  . ondansetron (ZOFRAN) injection 4 mg  4 mg Intravenous Q6H PRN Juluis Mire, MD   4 mg at 09/19/14 0933  . [START ON 09/29/2014] predniSONE (DELTASONE) tablet 40 mg  40 mg Oral Q breakfast Henreitta Leber, MD      . senna-docusate (Senokot-S) tablet 1 tablet  1 tablet Oral BID Flora Lipps, MD   1 tablet at 09/28/14 0930  . sertraline (ZOLOFT) tablet 100 mg  100 mg Oral Daily Henreitta Leber, MD   100 mg at 09/28/14 0930  . theophylline (THEODUR) 12 hr tablet 200 mg  200 mg Oral Q12H Erby Pian, MD   200 mg at 09/28/14 0931  . tiotropium (SPIRIVA) inhalation capsule 18 mcg  18 mcg Inhalation Daily Juluis Mire, MD   18 mcg at 09/28/14 0931  . traZODone (DESYREL) tablet 100 mg  100 mg Oral QHS Henreitta Leber, MD   100 mg at 09/27/14 2346  . vancomycin (VANCOCIN) 1,500 mg in sodium chloride 0.9 % 500 mL IVPB  1,500 mg Intravenous Q24H Henreitta Leber, MD   1,500 mg at 09/27/14 1658    Musculoskeletal: Strength & Muscle Tone: decreased Gait & Station: unable to stand Patient leans: N/A  Psychiatric Specialty Exam: Physical Exam  Psychiatric: She has a normal mood and affect. Her speech is normal and behavior is normal. Judgment and thought content normal. Cognition and memory are normal.  Despite her sickness patient is awake and alert and conversant. Affect is reactive and appropriate. Thoughts appear to be clear.    Review of Systems  Psychiatric/Behavioral: Negative for depression, suicidal ideas, hallucinations and substance abuse. The patient is not nervous/anxious and does not have insomnia.     Blood pressure 131/55, pulse 81, temperature 97.7 F (36.5 C), temperature source Oral, resp. rate 18, height 4' 11"  (1.499 m), weight 193 kg (425 lb 7.8 oz), SpO2 95 %.Body mass index is 85.89 kg/(m^2).  General Appearance: Fairly Groomed  Engineer, water::  Good  Speech:  Slow  Volume:  Decreased  Mood:  Anxious  Affect:  Appropriate  Thought Process:  Goal Directed  Orientation:  Full (Time, Place, and Person)  Thought Content:  Negative  Suicidal Thoughts:  No  Homicidal Thoughts:  No  Memory:  Immediate;   Good Recent;   Fair Remote;   Good  Judgement:  Intact   Insight:  Present  Psychomotor Activity:  Decreased  Concentration:  Fair  Recall:  [employer] of Knowledge:Fair  Language: Fair  Akathisia:  No  Handed:  Right  AIMS (if indicated):     Assets:  Communication Skills Desire for Improvement Resilience Social Support  ADL's:  Impaired  Cognition: WNL  Sleep:      Medical Decision Making: Established Problem, Stable/Improving (1), Review of Psycho-Social Stressors (1) and Review and summation of old records (2)  Treatment Plan Summary: Medication management and Plan Patient is currently on Zoloft 100 mg a day. No new complaints. Mood is stable. No sign of new depressive symptoms. Supportive counseling completed. Plan to continue current medication follow-up intermittently. Patient agrees.  Plan:  No evidence of imminent risk to self or others at present.   Patient does not meet criteria for psychiatric inpatient admission. Supportive therapy provided about ongoing stressors. Disposition: Continue inpatient follow-up  Alethia Berthold 09/28/2014 4:06 PM  Psychiatry: Follow-up for patient with a history of depression still in the critical care unit struggling with respiratory insufficiency. Patient states that her mood is doing pretty well. She worries about her husband and his diabetes but is not focusing on her own health as a worry. Her mood stays fairly good. She has not experienced any suicidal thoughts. Not experiencing any hallucinations or psychotic thinking.  Continues to obviously be short of breath otherwise no change to her review of systems.  Patient was awake alert and oriented. No sign of delirium. Made reasonable eye contact. Appropriate affect. No sign of disorganized thinking. No suicidal ideation.  Patient appears to be stable and doing well psychiatrically. No indication to change anything about her medicine. I will follow-up intermittently as needed. No change to diagnosis.

## 2014-09-28 NOTE — Plan of Care (Signed)
Problem: Discharge Progression Outcomes Goal: Pneumonia vaccine received if indicated Outcome: Not Applicable Date Met:  57/26/20 Vaccinated within the last 5 years. Goal: Other Discharge Outcomes/Goals Outcome: Progressing Plan of care progress to goals: Afebrile. VSS. O2 in the mid 90's on 5L O2 per [place]. Bi-PAP used during the night.  Denies pain.

## 2014-09-28 NOTE — Care Management Important Message (Signed)
Important Message  Patient Details  Name: Eileen [name] MRN: [id number] Date of Birth: 12-03-1945   Medicare Important Message Given:  N/A - LOS <3 / Initial given by admissions    Gwenette Greet 09/28/2014, 12:31 PM

## 2014-09-28 NOTE — Progress Notes (Signed)
[facility] - [place] at [facility]   PATIENT NAME: Eileen [name]    MR#:  [id number]  DATE OF BIRTH:  03-Nov-1945  SUBJECTIVE:  CHIEF COMPLAINT:   Chief Complaint  Patient presents with  . Shortness of Breath   Feels much better.  Off [place] and down to 4 L [place].  Remains constipated.   No complaints.   REVIEW OF SYSTEMS:   Review of Systems  Constitutional: Negative for fever and chills.  HENT: Negative for congestion and tinnitus.   Eyes: Negative for blurred vision and double vision.  Respiratory: Positive for shortness of breath (improved). Negative for cough and wheezing.   Cardiovascular: Negative for chest pain, orthopnea and PND.  Gastrointestinal: Positive for constipation. Negative for nausea, vomiting, abdominal pain and diarrhea.  Genitourinary: Negative for dysuria and hematuria.  Neurological: Negative for dizziness, sensory change and focal weakness.  All other systems reviewed and are negative.   DRUG ALLERGIES:   Allergies  Allergen Reactions  . Other Other (See Comments)    darvocet--pt reports blistering with this medication  . Penicillins Nausea And Vomiting  . Darvon [Propoxyphene] Other (See Comments)    Blisters    VITALS:  Blood pressure 131/55, pulse 81, temperature 97.7 F (36.5 C), temperature source Oral, resp. rate 18, height 4\' 11"  (1.499 m), weight 193 kg (425 lb 7.8 oz), SpO2 95 %.  PHYSICAL EXAMINATION:   GENERAL:  69 y.o.-year-old morbidly obsese patient lying in the bed in no apparent distress. EYES: Pupils equal, round, reactive to light and accommodation. No scleral icterus. Extraocular muscles intact.  HEENT: Head atraumatic, normocephalic. Oropharynx and nasopharynx clear. Most oral mucosa NECK:  Supple, no jugular venous distention. No thyroid enlargement, no tenderness.  LUNGS: No dullness to percussion. Negative use of accessory muscles. No rales or rhonchi noted wheezing  anteriorly. CARDIOVASCULAR: S1, S2 normal. No murmurs, rubs, or gallops.  ABDOMEN: Soft, nontender, nondistended. Bowel sounds present. No organomegaly or mass.  EXTREMITIES: 1+ pedal edema, No cyanosis, or clubbing.  NEUROLOGIC: Cranial nerves II through XII are intact. No focal motor or sensory deficits bilaterally. Globally weak.  PSYCHIATRIC: The patient is alert and oriented x 3. Globally weak  SKIN: No obvious rash, lesion, or ulcer.   Foley catheter in place draining clear urine.    LABORATORY PANEL:   CBC  Recent Labs Lab 09/25/14 0445  WBC 5.5  HGB 10.5*  HCT 32.1*  PLT 111*   ------------------------------------------------------------------------------------------------------------------  Chemistries   Recent Labs Lab 09/28/14 0515  NA 136  K 4.0  CL 105  CO2 27  GLUCOSE 126*  BUN 23*  CREATININE 0.70  CALCIUM 8.0*  MG 1.9   ------------------------------------------------------------------------------------------------------------------  Cardiac Enzymes No results for input(s): TROPONINI in the last 168 hours. ------------------------------------------------------------------------------------------------------------------  RADIOLOGY:  No results found.   ASSESSMENT AND PLAN:   69 year old female with past medical history of morbid obesity, obstructive sleep apnea, COPD, osteoarthritis, obesity pickwickian syndrome, chronic atrial fibrillation who presented to the hospital due to shortness of breath and noted to be in acute on chronic respiratory failure.  #1 acute on chronic hypoxic hypercarbic respiratory failure-this is likely secondary to patient's obesity pickwickian syndrome complicated with sleep apnea and COPD exacerbation/MRSA pneumonia. -Continue to taper prednisone, cont. DuoNeb nebs around-the-clock, Spiriva, and Vancomycin.  -Continue BiPAP with naps and at night.   #2 COPD exacerbation-likely secondary to underlying  pneumonia. -Continue prednisone taper, cont. DuoNeb's, Spiriva, Pulmicort -on IV vancomycin for MRSA pneumonia and can switch  to oral Bactrim upon discharge. -Continue BiPAP support.  #3 chronic afibrillation-rate controlled.   -Continue Eliquis  #4 MRSA pneumonia-likely present on admission and the cause of her COPD exacerbation and acute respiratory failure. -Continue IV vancomycin and can switch to oral Bactrim upon discharge. - needs a total of 2 weeks of therapy.  Clinically much improved  #5 depression-continue Zoloft.  #6 morbid obesity with obesity pickwickian syndrome-continue BiPAP support.  #7 thrombocytopenia-seems chronic for the patient. Etiology unclear. -No acute bleeding, avoid heparin products. Follow-up with hematology as outpatient.  #8 hyperglycemia-likely secondary to steroids. Much Improved. - cont. To taper steroids. Continue sliding scale insulin, Lantus.   #9 constipation-likely to due to patient being bedbound. No abdominal pain, nausea, vomiting. -Continue lactulose, Senokot. We'll give Dulcolax suppository today.  Seen by PT and they recommend SNF and CM aware and doing bed search.    All the records are reviewed and case discussed with Care Management/Social Workerr. Management plans discussed with the patient, family and they are in agreement.  CODE STATUS: DO NOT RESUSCITATE  DVT Prophylaxis - Eliquis.   TOTAL TIME TAKING CARE OF THIS PATIENT: 30 minutes.  Possible DC to skilled nursing facility in the next 2-3 days when bed available.  Houston Siren M.D on 09/28/2014 at 2:54 PM  Between 7am to 6pm - Pager - [phone]  After 6pm go to [email or web address] - password [email or web address]  [facility]  Office  [phone]  CC: Primary care physician; Pcp Not In System

## 2014-09-28 NOTE — Clinical Social Work Note (Signed)
Patient will require bipap at night and while sleeping. CSW spoke with Jonny Ruiz, the respiratory therapist for patient yesterday and was informed that patient's bipap settings are: 16/10 at 45%. Respiratory therapy informed CSW that 45% is equivalent to 6 liters of oxygen. Patient is on 5 liters of oxygen nasal cannula throughout the day. This information has been relayed to [place] [facility] and CSW awaiting determination. York Spaniel MSW,LCSW [phone]

## 2014-09-28 NOTE — Progress Notes (Signed)
Physical Therapy Treatment Patient Details Name: Eileen [name] MRN: [id number] DOB: [birth year] Today's Date: 09/28/2014    History of Present Illness presented to ER secondary to SOB, cough and hypoxia; admitted with acute/chronic respiratory failure related to COPD exacerbation.  Hospital course significant for further respiratory decline (requiring BiPAP) and runs of a-fib (requiring Cardizem drip, now off) with transition to CCU.  Currently on HFNC at 50%, 50L/min.    PT Comments    Patient requiring significant encouragement for participation with all therex/theract this date.  Easily distracted by external environment, requiring frequent redirection to task. Has progressed from HFNC to 5L [place] this date; maintains sats >95% with supine activities. Continues to require extensive physical assist for all bed-level activities, and is agreeable to limited mobility attempts (rolling towards L only); limited active effort displayed at times.  Will closely monitor participation/progress and reassess need for/benefit from skilled PT as appropriate.   Follow Up Recommendations  SNF (will closely monitor participation/progress)     Equipment Recommendations       Recommendations for Other Services       Precautions / Restrictions Precautions Precautions: Fall Precaution Comments: Contact isolation, droplet isolation Restrictions Weight Bearing Restrictions: No    Mobility  Bed Mobility Overal bed mobility: Needs Assistance   Rolling: Total assist         General bed mobility comments: unable to complete full rotation/roll despite dep assist from therapist  Transfers                 General transfer comment: unsafe/unable to tolerate at this time  Ambulation/Gait             General Gait Details: non-ambulatory at baseline   Stairs            Wheelchair Mobility    Modified Rankin (Stroke Patients Only)       Balance                                     Cognition                            Exercises Other Exercises Other Exercises: Bilat UE supine therex, 1x10, AROM: shoulder flex/ext, rowing/scapular retraction, D2 flex/ext; bilat LE supine therex, 1x15, AROM: ankle pumps, quad sets, glut sets, hip abduct/adduct. Other Exercises: Rolling towards L x2, dep assist from therapist.  Hand-over-hand assist for UE placement on rails; dep assist to initiate rotation. Unable to fully complete roll/rotation despite extensive assist.  Refuses further attempts or trial or contralateral side.    General Comments        Pertinent Vitals/Pain      Home Living                      Prior Function            PT Goals (current goals can now be found in the care plan section) Acute Rehab PT Goals Patient Stated Goal: "I don't know if I can do much" PT Goal Formulation: With patient Time For Goal Achievement: 10/09/14 Potential to Achieve Goals: Fair Progress towards PT goals: Not progressing toward goals - comment (patient with minimal change in activity tolerance or functional performance; extensive encouragement required throguhout)    Frequency  Min 2X/week    PT Plan Current plan remains appropriate  Co-evaluation             End of Session   Activity Tolerance: Patient limited by fatigue Patient left: in bed;with call bell/phone within reach     Time: 1458-1516 PT Time Calculation (min) (ACUTE ONLY): 18 min  Charges:  $Therapeutic Exercise: 8-22 mins                    G Codes:      [name] H. Manson Passey, PT, DPT, NCS 09/28/2014, 3:25 PM [phone]

## 2014-09-28 NOTE — Clinical Social Work Note (Signed)
Rosey Bath at [facility] has informed CSW that if patient's bipap can get down to delivering 28% or below then they can take patient, they currently cannot manage 45%.  York Spaniel MSW,LCSW

## 2014-09-29 LAB — GLUCOSE, CAPILLARY
GLUCOSE-CAPILLARY: 191 mg/dL — AB (ref 65–99)
Glucose-Capillary: 151 mg/dL — ABNORMAL HIGH (ref 65–99)
Glucose-Capillary: 254 mg/dL — ABNORMAL HIGH (ref 65–99)
Glucose-Capillary: 193 mg/dL — ABNORMAL HIGH (ref 65–99)

## 2014-09-29 LAB — VANCOMYCIN, TROUGH: Vancomycin Tr: 19 ug/mL (ref 10–20)

## 2014-09-29 MED ORDER — IPRATROPIUM-ALBUTEROL 0.5-2.5 (3) MG/3ML IN SOLN
3.0000 mL | Freq: Two times a day (BID) | RESPIRATORY_TRACT | Status: DC
Start: 2014-09-29 — End: 2014-10-01
  Administered 2014-09-29 – 2014-10-01 (×4): 3 mL via RESPIRATORY_TRACT
  Filled 2014-09-29 (×4): qty 3

## 2014-09-29 MED ORDER — PREDNISONE 20 MG PO TABS
30.0000 mg | ORAL_TABLET | Freq: Every day | ORAL | Status: DC
  Administered 2014-09-30: 10:00:00 30 mg via ORAL
  Filled 2014-09-29: qty 1

## 2014-09-29 NOTE — Plan of Care (Signed)
Problem: Discharge Progression Outcomes Goal: Other Discharge Outcomes/Goals Outcome: Progressing No c/o dyspnea this shift Patient o2 sats in the mid 90's on 5L o2 VSS, patient  Patient is tolerating diet well Awaiting possible SNF placement on 7/25-26

## 2014-09-29 NOTE — Progress Notes (Signed)
ANTIBIOTIC CONSULT NOTE - FOLLOW UP  Pharmacy Consult for Vancomycin Indication: pneumonia  Allergies  Allergen Reactions  . Other Other (See Comments)    darvocet--pt reports blistering with this medication  . Penicillins Nausea And Vomiting  . Darvon [Propoxyphene] Other (See Comments)    Blisters    Patient Measurements: Height: 4\' 11"  (149.9 cm) Weight: (!) 424 lb 9.6 oz (192.597 kg) IBW/kg (Calculated) : 43.2 Adjusted Body Weight: n/a  Vital Signs: Temp: 98.9 F (37.2 C) (07/23 1423) Temp Source: Oral (07/23 1423) BP: 133/60 mmHg (07/23 1423) Pulse Rate: 101 (07/23 1423) Intake/Output from previous day: 07/22 0701 - 07/23 0700 In: -  Out: 1500 [Urine:1500] Intake/Output from this shift: Total I/O In: -  Out: 750 [Urine:750]  Labs:  Recent Labs  09/27/14 0513 09/28/14 0515  CREATININE 0.69 0.70   Estimated Creatinine Clearance: 106.4 mL/min (by C-G formula based on Cr of 0.7).  Recent Labs  09/29/14 1722  VANCOTROUGH 19     Microbiology: Recent Results (from the past 720 hour(s))  MRSA PCR Screening     Status: None   Collection Time: 09/14/14  7:50 PM  Result Value Ref Range Status   MRSA by PCR NEGATIVE NEGATIVE Final    Comment:        The GeneXpert MRSA Assay (FDA approved for NASAL specimens only), is one component of a comprehensive MRSA colonization surveillance program. It is not intended to diagnose MRSA infection nor to guide or monitor treatment for MRSA infections.   Culture, expectorated sputum-assessment     Status: None   Collection Time: 09/18/14  6:15 PM  Result Value Ref Range Status   Specimen Description SPUTUM  Final   Special Requests NONE  Final   Sputum evaluation THIS SPECIMEN IS ACCEPTABLE FOR SPUTUM CULTURE  Final   Report Status 09/19/2014 FINAL  Final  Culture, respiratory (NON-Expectorated)     Status: None   Collection Time: 09/18/14  6:15 PM  Result Value Ref Range Status   Specimen Description SPUTUM   Final   Special Requests NONE Reflexed from [id number]  Final   Gram Stain   Final    FAIR SPECIMEN - 70-80% WBCS FEW WBC SEEN RARE GRAM POSITIVE COCCI IN CLUSTERS    Culture   Final    HEAVY GROWTH METHICILLIN RESISTANT STAPHYLOCOCCUS AUREUS CRITICAL RESULT CALLED TO, READ BACK BY AND VERIFIED WITH: PAM MEYERS AT 1218 09/21/14 DV    Report Status 09/21/2014 FINAL  Final   Organism ID, Bacteria METHICILLIN RESISTANT STAPHYLOCOCCUS AUREUS  Final      Susceptibility   Methicillin resistant staphylococcus aureus - MIC*    CIPROFLOXACIN 2 INTERMEDIATE Intermediate     GENTAMICIN <=0.5 SENSITIVE Sensitive     OXACILLIN >=4 RESISTANT Resistant     VANCOMYCIN 1 SENSITIVE Sensitive     TRIMETH/SULFA <=10 SENSITIVE Sensitive     CEFOXITIN SCREEN Value in next row Resistant      POSITIVECEFOXITIN SCREEN - This test may be used to predict mecA-mediated oxacillin resistance, and it is based on the cefoxitin disk screen test.  The cefoxitin screen and oxacillin work in combination to determine the final interpretation reported for oxacillin.     Inducible Clindamycin Value in next row Resistant      POSITIVEINDUCIBLE CLINDAMYCIN RESISTANCE - A positive ICR test is indicative of inducible resistance to macrolides, lincosamides, and type B streptogramin.  This isolate is presumed to be resistant to Clindamycin, however, Clindamycin may still be effective in some patients.  TETRACYCLINE Value in next row Sensitive      SENSITIVE<=1    * HEAVY GROWTH METHICILLIN RESISTANT STAPHYLOCOCCUS AUREUS    Anti-infectives    Start     Dose/Rate Route Frequency Ordered Stop   09/26/14 1800  vancomycin (VANCOCIN) 1,500 mg in sodium chloride 0.9 % 500 mL IVPB     1,500 mg 250 mL/hr over 120 Minutes Intravenous Every 24 hours 09/26/14 1048     09/24/14 1800  vancomycin (VANCOCIN) 1,500 mg in sodium chloride 0.9 % 500 mL IVPB  Status:  Discontinued     1,500 mg 250 mL/hr over 120 Minutes Intravenous Every 18 hours  09/23/14 1554 09/26/14 1048   09/23/14 2130  vancomycin (VANCOCIN) 1,500 mg in sodium chloride 0.9 % 500 mL IVPB  Status:  Discontinued     1,500 mg 250 mL/hr over 120 Minutes Intravenous Every 18 hours 09/23/14 1532 09/23/14 1533   09/23/14 1600  vancomycin (VANCOCIN) 1,500 mg in sodium chloride 0.9 % 500 mL IVPB  Status:  Discontinued     1,500 mg 250 mL/hr over 120 Minutes Intravenous Every 18 hours 09/23/14 1533 09/23/14 1554   09/21/14 1500  vancomycin (VANCOCIN) 1,500 mg in sodium chloride 0.9 % 500 mL IVPB  Status:  Discontinued     1,500 mg 250 mL/hr over 120 Minutes Intravenous Every 12 hours 09/21/14 1426 09/23/14 1532   09/17/14 1200  azithromycin (ZITHROMAX) 500 mg in dextrose 5 % 250 mL IVPB  Status:  Discontinued     500 mg 250 mL/hr over 60 Minutes Intravenous Every 24 hours 09/17/14 1113 09/18/14 1154   09/15/14 1800  azithromycin (ZITHROMAX) 500 mg in dextrose 5 % 250 mL IVPB  Status:  Discontinued     500 mg 250 mL/hr over 60 Minutes Intravenous Every 24 hours 09/14/14 0345 09/15/14 1311   09/15/14 1800  azithromycin (ZITHROMAX) tablet 500 mg  Status:  Discontinued     500 mg Oral Daily 09/15/14 1311 09/17/14 1113   09/14/14 0145  azithromycin (ZITHROMAX) 500 mg in dextrose 5 % 250 mL IVPB     500 mg 250 mL/hr over 60 Minutes Intravenous  Once 09/14/14 0133 09/14/14 0302      Assessment: Patient currently ordered Vancomycin  IV q24h. Vancomycin trough resulted at 75mcg/ml.  Goal of Therapy:  Vancomycin trough level 15-20 mcg/ml  Plan:  Will continue with current dosing.  Foye Deer 09/29/2014,6:37 PM

## 2014-09-29 NOTE — Plan of Care (Signed)
Problem: Discharge Progression Outcomes Goal: Other Discharge Outcomes/Goals Outcome: Progressing Plan of care progress to goals: Afebrile. VSS. O2 in the mid 90's on 5L O2 per [place]. Bi-PAP used during the night.   No c/o pain.

## 2014-09-29 NOTE — Progress Notes (Signed)
[facility] - [place] at [facility]   PATIENT NAME: Eileen [name]    MR#:  [id number]  DATE OF BIRTH:  12/03/1945  SUBJECTIVE:  CHIEF COMPLAINT:   Chief Complaint  Patient presents with  . Shortness of Breath   Shortness of breath much improved. Stable on nasal cannula. Constipation improved as patient had 3 bowel movements since yesterday.  REVIEW OF SYSTEMS:   Review of Systems  Constitutional: Negative for fever and chills.  HENT: Negative for congestion and tinnitus.   Eyes: Negative for blurred vision and double vision.  Respiratory: Positive for shortness of breath (improved). Negative for cough and wheezing.   Cardiovascular: Negative for chest pain, orthopnea and PND.  Gastrointestinal: Negative for nausea, vomiting, abdominal pain, diarrhea and constipation.  Genitourinary: Negative for dysuria and hematuria.  Neurological: Negative for dizziness, sensory change and focal weakness.  All other systems reviewed and are negative.   DRUG ALLERGIES:   Allergies  Allergen Reactions  . Other Other (See Comments)    darvocet--pt reports blistering with this medication  . Penicillins Nausea And Vomiting  . Darvon [Propoxyphene] Other (See Comments)    Blisters    VITALS:  Blood pressure 117/55, pulse 68, temperature 98.5 F (36.9 C), temperature source Oral, resp. rate 18, height 4\' 11"  (1.499 m), weight 192.597 kg (424 lb 9.6 oz), SpO2 96 %.  PHYSICAL EXAMINATION:   GENERAL:  69 y.o.-year-old morbidly obsese patient lying in the bed in no apparent distress. EYES: Pupils equal, round, reactive to light and accommodation. No scleral icterus. Extraocular muscles intact.  HEENT: Head atraumatic, normocephalic. Oropharynx and nasopharynx clear. Most oral mucosa NECK:  Supple, no jugular venous distention. No thyroid enlargement, no tenderness.  LUNGS: No dullness to percussion. Negative use of accessory muscles. No rales or rhonchi noted  wheezing anteriorly. CARDIOVASCULAR: S1, S2 normal. No murmurs, rubs, or gallops.  ABDOMEN: Soft, nontender, nondistended. Bowel sounds present. No organomegaly or mass.  EXTREMITIES: 1+ pedal edema, No cyanosis, or clubbing.  NEUROLOGIC: Cranial nerves II through XII are intact. No focal motor or sensory deficits bilaterally. Globally weak.  PSYCHIATRIC: The patient is alert and oriented x 3. Globally weak  SKIN: No obvious rash, lesion, or ulcer.   Foley catheter in place draining clear urine.    LABORATORY PANEL:   CBC  Recent Labs Lab 09/25/14 0445  WBC 5.5  HGB 10.5*  HCT 32.1*  PLT 111*   ------------------------------------------------------------------------------------------------------------------  Chemistries   Recent Labs Lab 09/28/14 0515  NA 136  K 4.0  CL 105  CO2 27  GLUCOSE 126*  BUN 23*  CREATININE 0.70  CALCIUM 8.0*  MG 1.9   ------------------------------------------------------------------------------------------------------------------  Cardiac Enzymes No results for input(s): TROPONINI in the last 168 hours. ------------------------------------------------------------------------------------------------------------------  RADIOLOGY:  No results found.   ASSESSMENT AND PLAN:   69 year old female with past medical history of morbid obesity, obstructive sleep apnea, COPD, osteoarthritis, obesity pickwickian syndrome, chronic atrial fibrillation who presented to the hospital due to shortness of breath and noted to be in acute on chronic respiratory failure.  #1 acute on chronic hypoxic hypercarbic respiratory failure-this is likely secondary to patient's obesity pickwickian syndrome complicated with sleep apnea and COPD exacerbation/MRSA pneumonia. -Continue to taper prednisone, cont. DuoNeb nebs around-the-clock, Spiriva, and Vancomycin.  -Continue BiPAP with naps and at night.   #2 COPD exacerbation-likely secondary to underlying  pneumonia. -Continue prednisone taper, cont. DuoNeb's, Spiriva, Pulmicort -on IV vancomycin for MRSA pneumonia and can switch to oral Bactrim upon  discharge. -Continue BiPAP support.  #3 chronic afibrillation-rate controlled.   -Continue Eliquis  #4 MRSA pneumonia-likely present on admission and the cause of her COPD exacerbation and acute respiratory failure. -Continue IV vancomycin and can switch to oral Bactrim upon discharge. - needs a total of 2 weeks of therapy.  Clinically much improved  #5 depression-continue Zoloft.  #6 morbid obesity with obesity pickwickian syndrome-continue BiPAP support.  #7 thrombocytopenia-seems chronic for the patient. Etiology unclear. -No acute bleeding.  Follow-up with hematology as outpatient.  #8 hyperglycemia-likely secondary to steroids. Much Improved. - cont. To taper steroids. Continue sliding scale insulin, Lantus.   #9 constipation-resolved as patient had 3 bowel movements yesterday. -We'll DC lactulose. Continue Senokot.  Seen by PT and they recommend SNF and CM aware and doing bed search.    All the records are reviewed and case discussed with Care Management/Social Workerr. Management plans discussed with the patient, family and they are in agreement.  CODE STATUS: DO NOT RESUSCITATE  DVT Prophylaxis - Eliquis.   TOTAL TIME TAKING CARE OF THIS PATIENT: 25 minutes.  Possible DC to skilled nursing facility in the next 2-3 days when bed available.  Houston Siren M.D on 09/29/2014 at 2:03 PM  Between 7am to 6pm - Pager - [phone]  After 6pm go to [email or web address] - password [email or web address]  [facility]  Office  [phone]  CC: Primary care physician; Pcp Not In System

## 2014-09-30 LAB — GLUCOSE, CAPILLARY
Glucose-Capillary: 115 mg/dL — ABNORMAL HIGH (ref 65–99)
Glucose-Capillary: 163 mg/dL — ABNORMAL HIGH (ref 65–99)
Glucose-Capillary: 268 mg/dL — ABNORMAL HIGH (ref 65–99)
Glucose-Capillary: 175 mg/dL — ABNORMAL HIGH (ref 65–99)

## 2014-09-30 MED ORDER — PREDNISONE 20 MG PO TABS
20.0000 mg | ORAL_TABLET | Freq: Every day | ORAL | Status: DC
  Administered 2014-10-01: 09:00:00 20 mg via ORAL
  Filled 2014-09-30: qty 1

## 2014-09-30 NOTE — Progress Notes (Signed)
[facility] - [place] at [facility]   PATIENT NAME: Eileen [name]    MR#:  [id number]  DATE OF BIRTH:  1945-09-05  SUBJECTIVE:  CHIEF COMPLAINT:   Chief Complaint  Patient presents with  . Shortness of Breath   Shortness of breath much improved. Stable on nasal cannula.  No acute events overnight.    REVIEW OF SYSTEMS:   Review of Systems  Constitutional: Negative for fever and chills.  HENT: Negative for congestion and tinnitus.   Eyes: Negative for blurred vision and double vision.  Respiratory: Negative for cough, shortness of breath and wheezing.   Cardiovascular: Negative for chest pain, orthopnea and PND.  Gastrointestinal: Negative for nausea, vomiting, abdominal pain, diarrhea and constipation.  Genitourinary: Negative for dysuria and hematuria.  Neurological: Negative for dizziness, sensory change and focal weakness.  All other systems reviewed and are negative.   DRUG ALLERGIES:   Allergies  Allergen Reactions  . Other Other (See Comments)    darvocet--pt reports blistering with this medication  . Penicillins Nausea And Vomiting  . Darvon [Propoxyphene] Other (See Comments)    Blisters    VITALS:  Blood pressure 108/56, pulse 77, temperature 97.7 F (36.5 C), temperature source Oral, resp. rate 17, height  (1.499 m), weight 194.003 kg (427 lb 11.2 oz), SpO2 96 %.  PHYSICAL EXAMINATION:   GENERAL:  69 y.o.-year-old morbidly obsese patient lying in the bed in no apparent distress. EYES: Pupils equal, round, reactive to light and accommodation. No scleral icterus. Extraocular muscles intact.  HEENT: Head atraumatic, normocephalic. Oropharynx and nasopharynx clear. Most oral mucosa NECK:  Supple, no jugular venous distention. No thyroid enlargement, no tenderness.  LUNGS: No dullness to percussion. Negative use of accessory muscles. No rales or rhonchi noted wheezing anteriorly. CARDIOVASCULAR: S1, S2 normal. No murmurs, rubs,  or gallops.  ABDOMEN: Soft, nontender, nondistended. Bowel sounds present. No organomegaly or mass.  EXTREMITIES: 1+ pedal edema, No cyanosis, or clubbing.  NEUROLOGIC: Cranial nerves II through XII are intact. No focal motor or sensory deficits bilaterally. Globally weak.  PSYCHIATRIC: The patient is alert and oriented x 3. Globally weak  SKIN: No obvious rash, lesion, or ulcer.   Foley catheter in place draining clear urine.    LABORATORY PANEL:   CBC  Recent Labs Lab 09/25/14 0445  WBC 5.5  HGB 10.5*  HCT 32.1*  PLT 111*   ------------------------------------------------------------------------------------------------------------------  Chemistries   Recent Labs Lab 09/28/14 0515  NA 136  K 4.0  CL 105  CO2 27  GLUCOSE 126*  BUN 23*  CREATININE 0.70  CALCIUM 8.0*  MG 1.9   ------------------------------------------------------------------------------------------------------------------  Cardiac Enzymes No results for input(s): TROPONINI in the last 168 hours. ------------------------------------------------------------------------------------------------------------------  RADIOLOGY:  No results found.   ASSESSMENT AND PLAN:   69 year old female with past medical history of morbid obesity, obstructive sleep apnea, COPD, osteoarthritis, obesity pickwickian syndrome, chronic atrial fibrillation who presented to the hospital due to shortness of breath and noted to be in acute on chronic respiratory failure.  #1 acute on chronic hypoxic hypercarbic respiratory failure-this is likely secondary to patient's obesity pickwickian syndrome complicated with sleep apnea and COPD exacerbation/MRSA pneumonia. -Continue to taper prednisone and likely finish taper tomorrow, cont. DuoNeb as needed, Spiriva, and Vancomycin.  -Continue BiPAP with naps and at night.   #2 COPD exacerbation-likely secondary to underlying pneumonia. -Continue prednisone taper, cont. DuoNeb's,  Spiriva, Pulmicort -on IV vancomycin for MRSA pneumonia and can switch to oral Bactrim upon discharge. -  Continue BiPAP support.  #3 chronic afibrillation-rate controlled.   -Continue Eliquis  #4 MRSA pneumonia-likely present on admission and the cause of her COPD exacerbation and acute respiratory failure. -Continue IV vancomycin and can switch to oral Bactrim upon discharge. - needs a total of 2 weeks of therapy.  Clinically much improved  #5 depression-continue Zoloft.  #6 morbid obesity with obesity pickwickian syndrome-continue BiPAP support.  #7 thrombocytopenia-seems chronic for the patient. Etiology unclear. -No acute bleeding.  Follow-up with hematology as outpatient.  #8 hyperglycemia-likely secondary to steroids. Much Improved. - Continue sliding scale insulin, Lantus.   #9 constipation- resolved and cont. Senakot.   - off lactulose now.   Seen by PT and they recommend SNF and CM aware and doing bed search.    All the records are reviewed and case discussed with Care Management/Social Workerr. Management plans discussed with the patient, family and they are in agreement.  CODE STATUS: DO NOT RESUSCITATE  DVT Prophylaxis - Eliquis.   TOTAL TIME TAKING CARE OF THIS PATIENT: 25 minutes.  Possible DC to skilled nursing facility when bed available.  Houston Siren M.D on 09/30/2014 at 11:25 AM  Between 7am to 6pm - Pager - [phone]  After 6pm go to [email or web address] - password [email or web address]  [facility]  Office  [phone]  CC: Primary care physician; Pcp Not In System

## 2014-09-30 NOTE — Progress Notes (Signed)
ANTIBIOTIC CONSULT NOTE - FOLLOW UP  Pharmacy Consult for Vancomycin Indication: pneumonia  Allergies  Allergen Reactions  . Other Other (See Comments)    darvocet--pt reports blistering with this medication  . Penicillins Nausea And Vomiting  . Darvon [Propoxyphene] Other (See Comments)    Blisters    Patient Measurements: Height:  (149.9 cm) Weight: (!) 427 lb 11.2 oz (194.003 kg) IBW/kg (Calculated) : 43.2   Vital Signs: Temp: 97.7 F (36.5 C) (07/24 0546) Temp Source: Oral (07/24 0546) BP: 108/56 mmHg (07/24 0546) Pulse Rate: 77 (07/24 0546) Intake/Output from previous day: 07/23 0701 - 07/24 0700 In: -  Out: 1200 [Urine:1200] Intake/Output from this shift: Total I/O In: -  Out: 300 [Urine:300]  Labs:  Recent Labs  09/28/14 0515  CREATININE 0.70   Estimated Creatinine Clearance: 106.9 mL/min (by C-G formula based on Cr of 0.7).  Recent Labs  09/29/14 1722  VANCOTROUGH 19     Microbiology: Recent Results (from the past 720 hour(s))  MRSA PCR Screening     Status: None   Collection Time: 09/14/14  7:50 PM  Result Value Ref Range Status   MRSA by PCR NEGATIVE NEGATIVE Final    Comment:        The GeneXpert MRSA Assay (FDA approved for NASAL specimens only), is one component of a comprehensive MRSA colonization surveillance program. It is not intended to diagnose MRSA infection nor to guide or monitor treatment for MRSA infections.   Culture, expectorated sputum-assessment     Status: None   Collection Time: 09/18/14  6:15 PM  Result Value Ref Range Status   Specimen Description SPUTUM  Final   Special Requests NONE  Final   Sputum evaluation THIS SPECIMEN IS ACCEPTABLE FOR SPUTUM CULTURE  Final   Report Status 09/19/2014 FINAL  Final  Culture, respiratory (NON-Expectorated)     Status: None   Collection Time: 09/18/14  6:15 PM  Result Value Ref Range Status   Specimen Description SPUTUM  Final   Special Requests NONE Reflexed from  [id number]  Final   Gram Stain   Final    FAIR SPECIMEN - 70-80% WBCS FEW WBC SEEN RARE GRAM POSITIVE COCCI IN CLUSTERS    Culture   Final    HEAVY GROWTH METHICILLIN RESISTANT STAPHYLOCOCCUS AUREUS CRITICAL RESULT CALLED TO, READ BACK BY AND VERIFIED WITH: PAM MEYERS AT 1218 09/21/14 DV    Report Status 09/21/2014 FINAL  Final   Organism ID, Bacteria METHICILLIN RESISTANT STAPHYLOCOCCUS AUREUS  Final      Susceptibility   Methicillin resistant staphylococcus aureus - MIC*    CIPROFLOXACIN 2 INTERMEDIATE Intermediate     GENTAMICIN <=0.5 SENSITIVE Sensitive     OXACILLIN >=4 RESISTANT Resistant     VANCOMYCIN 1 SENSITIVE Sensitive     TRIMETH/SULFA <=10 SENSITIVE Sensitive     CEFOXITIN SCREEN Value in next row Resistant      POSITIVECEFOXITIN SCREEN - This test may be used to predict mecA-mediated oxacillin resistance, and it is based on the cefoxitin disk screen test.  The cefoxitin screen and oxacillin work in combination to determine the final interpretation reported for oxacillin.     Inducible Clindamycin Value in next row Resistant      POSITIVEINDUCIBLE CLINDAMYCIN RESISTANCE - A positive ICR test is indicative of inducible resistance to macrolides, lincosamides, and type B streptogramin.  This isolate is presumed to be resistant to Clindamycin, however, Clindamycin may still be effective in some patients.     TETRACYCLINE Value  in next row Sensitive      SENSITIVE<=1    * HEAVY GROWTH METHICILLIN RESISTANT STAPHYLOCOCCUS AUREUS    Anti-infectives    Start     Dose/Rate Route Frequency Ordered Stop   09/26/14 1800  vancomycin (VANCOCIN) 1,500 mg in sodium chloride 0.9 % 500 mL IVPB     1,500 mg 250 mL/hr over 120 Minutes Intravenous Every 24 hours 09/26/14 1048     09/24/14 1800  vancomycin (VANCOCIN) 1,500 mg in sodium chloride 0.9 % 500 mL IVPB  Status:  Discontinued     1,500 mg 250 mL/hr over 120 Minutes Intravenous Every 18 hours 09/23/14 1554 09/26/14 1048   09/23/14 2130   vancomycin (VANCOCIN) 1,500 mg in sodium chloride 0.9 % 500 mL IVPB  Status:  Discontinued     1,500 mg 250 mL/hr over 120 Minutes Intravenous Every 18 hours 09/23/14 1532 09/23/14 1533   09/23/14 1600  vancomycin (VANCOCIN) 1,500 mg in sodium chloride 0.9 % 500 mL IVPB  Status:  Discontinued     1,500 mg 250 mL/hr over 120 Minutes Intravenous Every 18 hours 09/23/14 1533 09/23/14 1554   09/21/14 1500  vancomycin (VANCOCIN) 1,500 mg in sodium chloride 0.9 % 500 mL IVPB  Status:  Discontinued     1,500 mg 250 mL/hr over 120 Minutes Intravenous Every 12 hours 09/21/14 1426 09/23/14 1532   09/17/14 1200  azithromycin (ZITHROMAX) 500 mg in dextrose 5 % 250 mL IVPB  Status:  Discontinued     500 mg 250 mL/hr over 60 Minutes Intravenous Every 24 hours 09/17/14 1113 09/18/14 1154   09/15/14 1800  azithromycin (ZITHROMAX) 500 mg in dextrose 5 % 250 mL IVPB  Status:  Discontinued     500 mg 250 mL/hr over 60 Minutes Intravenous Every 24 hours 09/14/14 0345 09/15/14 1311   09/15/14 1800  azithromycin (ZITHROMAX) tablet 500 mg  Status:  Discontinued     500 mg Oral Daily 09/15/14 1311 09/17/14 1113   09/14/14 0145  azithromycin (ZITHROMAX) 500 mg in dextrose 5 % 250 mL IVPB     500 mg 250 mL/hr over 60 Minutes Intravenous  Once 09/14/14 0133 09/14/14 0302      Assessment: Patient currently ordered Vancomycin 1500mg  IV q24h for MRSA PNA. Vancomycin trough resulted at 10mcg/ml, before 5th dose.  Goal of Therapy:  Vancomycin trough level 15-20 mcg/ml  Plan:  Will continue with current dosing.  Will order SCr for tomorrow AM to continue to monitor renal function.  May need to repeat trough level in a few days to confirm not >20 as pt is at risk for accumulation, especially if renal function worsens.   Crist Fat L 09/30/2014,10:34 AM

## 2014-09-30 NOTE — Plan of Care (Signed)
Problem: Discharge Progression Outcomes Goal: Other Discharge Outcomes/Goals Outcome: Progressing Plan of Care Progress to Goal:  No acute events occurred during shift.  Pt hemodynamically stable.  No c/o pain.  Still on IV abx.  Pt going to SNF upon d/c

## 2014-09-30 NOTE — Plan of Care (Signed)
Problem: Discharge Progression Outcomes Goal: Other Discharge Outcomes/Goals Outcome: Progressing Patient is alert and oriented, no c/o pain. Utilizes bed pan with assistance. Bipap utilized during the night.

## 2014-10-01 LAB — GLUCOSE, CAPILLARY
GLUCOSE-CAPILLARY: 91 mg/dL (ref 65–99)
Glucose-Capillary: 135 mg/dL — ABNORMAL HIGH (ref 65–99)
Glucose-Capillary: 198 mg/dL — ABNORMAL HIGH (ref 65–99)
Glucose-Capillary: 154 mg/dL — ABNORMAL HIGH (ref 65–99)

## 2014-10-01 LAB — CREATININE, SERUM
CREATININE: 0.6 mg/dL (ref 0.44–1.00)
GFR calc Af Amer: >60 mL/min (ref >60)

## 2014-10-01 LAB — HEMOGLOBIN: Hemoglobin: 10.2 g/dL — ABNORMAL LOW (ref 12.0–16.0)

## 2014-10-01 MED ORDER — PREDNISONE 10 MG PO TABS
10.0000 mg | ORAL_TABLET | Freq: Every day | ORAL | Status: DC
  Administered 2014-10-02: 10 mg via ORAL
  Filled 2014-10-01: qty 1

## 2014-10-01 MED ORDER — SULFAMETHOXAZOLE-TRIMETHOPRIM 400-80 MG PO TABS
2.0000 | ORAL_TABLET | Freq: Two times a day (BID) | ORAL | Status: DC
  Administered 2014-10-01: 22:00:00 2 via ORAL
  Filled 2014-10-01 (×4): qty 2

## 2014-10-01 MED ORDER — ALBUTEROL SULFATE (2.5 MG/3ML) 0.083% IN NEBU
2.5000 mg | INHALATION_SOLUTION | Freq: Four times a day (QID) | RESPIRATORY_TRACT | Status: DC
  Administered 2014-10-01 – 2014-10-03 (×9): 2.5 mg via RESPIRATORY_TRACT
  Filled 2014-10-01 (×9): qty 3

## 2014-10-01 MED ORDER — LACTULOSE 10 GM/15ML PO SOLN
30.0000 g | Freq: Every day | ORAL | Status: DC | PRN
  Administered 2014-10-01: 22:00:00 30 g via ORAL
  Filled 2014-10-01: qty 60

## 2014-10-01 NOTE — Progress Notes (Signed)
[facility] - [place] at [facility]   PATIENT NAME: Eileen [name]    MR#:  [id number]  DATE OF BIRTH:  [birth year]  SUBJECTIVE:  CHIEF COMPLAINT:   Chief Complaint  Patient presents with  . Shortness of Breath   Slept well on Bipap.  No acute issues overnight.  Awaiting Discharge to SNF when bed available.  Needs to wean off O2 on Bipap.    REVIEW OF SYSTEMS:   Review of Systems  Constitutional: Negative for fever and chills.  HENT: Negative for congestion and tinnitus.   Eyes: Negative for blurred vision and double vision.  Respiratory: Negative for cough, shortness of breath and wheezing.   Cardiovascular: Negative for chest pain, orthopnea and PND.  Gastrointestinal: Negative for nausea, vomiting, abdominal pain, diarrhea and constipation.  Genitourinary: Negative for dysuria and hematuria.  Neurological: Negative for dizziness, sensory change and focal weakness.  All other systems reviewed and are negative.   DRUG ALLERGIES:   Allergies  Allergen Reactions  . Other Other (See Comments)    darvocet--pt reports blistering with this medication  . Penicillins Nausea And Vomiting  . Darvon [Propoxyphene] Other (See Comments)    Blisters    VITALS:  Blood pressure 119/52, pulse 86, temperature 98.4 F (36.9 C), temperature source Oral, resp. rate 20, height 4\' 11"  (1.499 m), weight 191.237 kg (421 lb 9.6 oz), SpO2 97 %.  PHYSICAL EXAMINATION:   GENERAL:  69 y.o.-year-old morbidly obsese patient lying in the bed in no apparent distress. EYES: Pupils equal, round, reactive to light and accommodation. No scleral icterus. Extraocular muscles intact.  HEENT: Head atraumatic, normocephalic. Oropharynx and nasopharynx clear. Most oral mucosa NECK:  Supple, no jugular venous distention. No thyroid enlargement, no tenderness.  LUNGS: No dullness to percussion. Negative use of accessory muscles. No rales or rhonchi noted wheezing  anteriorly. CARDIOVASCULAR: S1, S2 normal. No murmurs, rubs, or gallops.  ABDOMEN: Soft, nontender, nondistended. Bowel sounds present. No organomegaly or mass.  EXTREMITIES: +1 pedal edema b/l, No cyanosis, or clubbing.  NEUROLOGIC: Cranial nerves II through XII are intact. No focal motor or sensory deficits bilaterally. Globally weak.  PSYCHIATRIC: The patient is alert and oriented x 3. Globally weak  SKIN: No obvious rash, lesion, or ulcer.    LABORATORY PANEL:   CBC  Recent Labs Lab 09/25/14 0445 10/01/14 0435  WBC 5.5  --   HGB 10.5* 10.2*  HCT 32.1*  --   PLT 111*  --    ------------------------------------------------------------------------------------------------------------------  Chemistries   Recent Labs Lab 09/28/14 0515 10/01/14 0435  NA 136  --   K 4.0  --   CL 105  --   CO2 27  --   GLUCOSE 126*  --   BUN 23*  --   CREATININE 0.70 0.60  CALCIUM 8.0*  --   MG 1.9  --    ------------------------------------------------------------------------------------------------------------------  Cardiac Enzymes No results for input(s): TROPONINI in the last 168 hours. ------------------------------------------------------------------------------------------------------------------  RADIOLOGY:  No results found.   ASSESSMENT AND PLAN:   69 year old female with past medical history of morbid obesity, obstructive sleep apnea, COPD, osteoarthritis, obesity pickwickian syndrome, chronic atrial fibrillation who presented to the hospital due to shortness of breath and noted to be in acute on chronic respiratory failure.  #1 acute on chronic hypoxic hypercarbic respiratory failure-this is likely secondary to patient's obesity pickwickian syndrome complicated with sleep apnea and COPD exacerbation/MRSA pneumonia. -Continue to taper prednisone and likely finish taper tomorrow, cont. DuoNeb as needed,  Spiriva, and Vancomycin.  -Continue BiPAP with naps and at night.  Need to wean FiO2 with Bipap to 22%.  Discussed w/ Respiratory and will tolerate O2 sats 88% or higher and wean FiO2 with Bipap as tolerated.   #2 COPD exacerbation-likely secondary to underlying pneumonia. -Continue prednisone taper, cont. DuoNeb's, Spiriva, Pulmicort -on IV vancomycin for MRSA pneumonia and can switch to oral Bactrim upon discharge. -Continue BiPAP support.  #3 chronic afibrillation-rate controlled.   -Continue Eliquis  #4 MRSA pneumonia-likely present on admission and the cause of her COPD exacerbation and acute respiratory failure. -Continue IV vancomycin and can switch to oral Bactrim upon discharge. - needs a total of 2 weeks of therapy.  Clinically much improved  #5 depression-continue Zoloft.  #6 morbid obesity with obesity pickwickian syndrome-continue BiPAP support.  #7 thrombocytopenia-seems chronic for the patient. Etiology unclear. -No acute bleeding.  Follow-up with hematology as outpatient.  #8 hyperglycemia-likely secondary to steroids. Much Improved. - Continue sliding scale insulin, Lantus.   #9 constipation- resolved and cont. Senakot.   - cont. Maintenance Lactulose.   Seen by PT and they recommend SNF and CM aware and doing bed search.    All the records are reviewed and case discussed with Care Management/Social Workerr. Management plans discussed with the patient, family and they are in agreement.  CODE STATUS: DO NOT RESUSCITATE  DVT Prophylaxis - Eliquis.   TOTAL TIME TAKING CARE OF THIS PATIENT: 30 minutes.  Possible DC to skilled nursing facility when bed available.  Houston Siren M.D on 10/01/2014 at 9:23 AM  Between 7am to 6pm - Pager - [phone]  After 6pm go to [email or web address] - password [email or web address]  [facility]  Office  [phone]  CC: Primary care physician; Pcp Not In System

## 2014-10-01 NOTE — Consult Note (Signed)
Palliative Care Update:  Pt is known to me from my having seen her 2-3 weeks ago.  Unable to see pt today, but will talk with her tomorrow. She remains DNR.  She is being weaned down from BIPAP but her oxygen needs are limiting her discharge options.    Cammie Mcgee, MD

## 2014-10-01 NOTE — Clinical Social Work Note (Signed)
CSW updated pt's APS worker on pt's discharge status. CSW will update [facility] on BiPAP settings as they are currently too high for the facility to manage. Pt's only potential bed offer is [facility]. CSW will continue to follow.   Dede Query, MSW, LCSW Clinical Social Worker  [phone]

## 2014-10-01 NOTE — Plan of Care (Signed)
Problem: Discharge Progression Outcomes Goal: Other Discharge Outcomes/Goals Outcome: Progressing Plan of care progress to goal: Afebrile. VSS. Continue 5L O2 per [place]. Uses Bi-PAP during the night. ABX, nebs, prednisone. Denies pain. Awaiting a bed in SNF.

## 2014-10-01 NOTE — Care Management Important Message (Signed)
Important Message  Patient Details  Name: Eileen [name] MRN: [id number] Date of Birth: April 20, 1945   Medicare Important Message Given:   (IM given)    Gwenette Greet 10/01/2014, 9:53 AM

## 2014-10-01 NOTE — Plan of Care (Signed)
Problem: Discharge Progression Outcomes Goal: Other Discharge Outcomes/Goals Outcome: Progressing No c/o shortness of breath this shift Patients  o2 via n/c was reduced from 5L to 4L to 1L by respiratory, sats remained in the lower 90's on 1 liter o2 n/c Patient is awaiting acceptance for SNF Patient is tolerating diet well  Possible discharge tommorow

## 2014-10-02 LAB — GLUCOSE, CAPILLARY
GLUCOSE-CAPILLARY: 151 mg/dL — AB (ref 65–99)
Glucose-Capillary: 84 mg/dL (ref 65–99)
Glucose-Capillary: 105 mg/dL — ABNORMAL HIGH (ref 65–99)
Glucose-Capillary: 183 mg/dL — ABNORMAL HIGH (ref 65–99)

## 2014-10-02 MED ORDER — SULFAMETHOXAZOLE-TRIMETHOPRIM 800-160 MG PO TABS: 2.0000 | ORAL_TABLET | Freq: Two times a day (BID) | ORAL | Status: DC

## 2014-10-02 MED ORDER — ALBUTEROL SULFATE (2.5 MG/3ML) 0.083% IN NEBU: 2.5000 mg | INHALATION_SOLUTION | Freq: Four times a day (QID) | RESPIRATORY_TRACT | Status: DC

## 2014-10-02 MED ORDER — LORAZEPAM 1 MG PO TABS: 1.0000 mg | ORAL_TABLET | Freq: Three times a day (TID) | ORAL | Status: DC | PRN

## 2014-10-02 MED ORDER — INSULIN ASPART 100 UNIT/ML ~~LOC~~ SOLN: 0.0000 [IU] | Freq: Three times a day (TID) | SUBCUTANEOUS | Status: DC

## 2014-10-02 MED ORDER — SULFAMETHOXAZOLE-TRIMETHOPRIM 800-160 MG PO TABS
2.0000 | ORAL_TABLET | Freq: Two times a day (BID) | ORAL | Status: DC
  Administered 2014-10-02 – 2014-10-03 (×2): 2 via ORAL
  Filled 2014-10-02 (×2): qty 2

## 2014-10-02 MED ORDER — LACTULOSE 10 GM/15ML PO SOLN: 30.0000 g | Freq: Every day | ORAL | Status: DC | PRN

## 2014-10-02 MED ORDER — THEOPHYLLINE ER 200 MG PO TB12
200.0000 mg | ORAL_TABLET | Freq: Two times a day (BID) | ORAL | Status: DC
Start: 2014-10-02 — End: 2015-08-27

## 2014-10-02 MED ORDER — TIOTROPIUM BROMIDE MONOHYDRATE 18 MCG IN CAPS: 18.0000 ug | ORAL_CAPSULE | Freq: Every day | RESPIRATORY_TRACT | Status: DC

## 2014-10-02 MED ORDER — SULFAMETHOXAZOLE-TRIMETHOPRIM 800-160 MG PO TABS: 2.0000 | ORAL_TABLET | Freq: Two times a day (BID) | ORAL | Status: AC

## 2014-10-02 MED ORDER — INSULIN GLARGINE 100 UNIT/ML ~~LOC~~ SOLN: 15.0000 [IU] | Freq: Every day | SUBCUTANEOUS | Status: DC

## 2014-10-02 MED ORDER — SENNOSIDES-DOCUSATE SODIUM 8.6-50 MG PO TABS: 1.0000 | ORAL_TABLET | Freq: Two times a day (BID) | ORAL | Status: DC

## 2014-10-02 MED ORDER — INSULIN ASPART 100 UNIT/ML ~~LOC~~ SOLN: 0.0000 [IU] | Freq: Every day | SUBCUTANEOUS | Status: DC

## 2014-10-02 MED ORDER — ACETAZOLAMIDE 250 MG PO TABS: 250.0000 mg | ORAL_TABLET | Freq: Two times a day (BID) | ORAL | Status: DC

## 2014-10-02 MED ORDER — SULFAMETHOXAZOLE-TRIMETHOPRIM 800-160 MG PO TABS
1.0000 | ORAL_TABLET | Freq: Two times a day (BID) | ORAL | Status: DC
  Administered 2014-10-02: 1 via ORAL
  Filled 2014-10-02: qty 1

## 2014-10-02 MED ORDER — BISACODYL 10 MG RE SUPP: 10.0000 mg | Freq: Every day | RECTAL | Status: DC | PRN

## 2014-10-02 MED ORDER — MORPHINE SULFATE (CONCENTRATE) 10 MG/0.5ML PO SOLN: 5.0000 mg | ORAL | Status: DC | PRN

## 2014-10-02 NOTE — Progress Notes (Signed)
PT Cancellation Note  Patient Details Name: STEPHAN DRAUGHN MRN: [id number] DOB: 11/24/1945   Cancelled Treatment:    Reason Eval/Treat Not Completed: Other (comment) (Treatment session attempted.  Patient currently eating breakfast; will re-attempt at later time/date as available and medically appropriate.)   Sean Malinowski H. Manson Passey, PT, DPT, NCS 10/02/2014, 8:57 AM [phone]

## 2014-10-02 NOTE — Consult Note (Signed)
[facility] Consult   Reason for Consult:  Follow-up consult for this 69 year old woman with a history of depression currently in the hospital for respiratory failure Referring Physician:  Verdell Carmine Patient Identification: Eileen [name] MRN:  [id number] Principal Diagnosis: Acute on chronic respiratory failure with hypoxia Diagnosis:   Patient Active Problem List   Diagnosis Date Noted  . Acute delirium [R41.0] 09/17/2014  . Acute on chronic respiratory failure with hypoxia [J96.21] 09/14/2014  . COPD exacerbation [J44.1] 09/14/2014  . Pressure ulcer [L89.90] 09/14/2014    Total Time spent with patient: 20 minutes  Subjective:   Eileen [name] is a 69 y.o. female patient admitted with patient was admitted with respiratory failure. Earlier had symptoms of depression. Seems to be improving.Marland Kitchen  HPI:  Patient seen and chart reviewe No new complaints. Affect upbeat. Optimistic. Tolerating medicine w. No sign of acute depr HPI Elements:   Quality:  Mood with a history of depression. Severity:  Currently mild to moderate. Timing:  Getting better throughout the hospital stay. Duration:  Chronic problem. Context:  Severe medical problems.  Past Medical History:  Past Medical History  Diagnosis Date  . COPD (chronic obstructive pulmonary disease)   . Arthritis     Past Surgical History  Procedure Laterality Date  . Laminectomy    . Back surgery     Family History:  Family History  Problem Relation Age of Onset  . CAD Mother   . Diabetes type II Mother   . Hypertension Father   . CAD Father    Social History:  History  Alcohol Use No     History  Drug Use No    History   Social History  . Marital Status: Married    Spouse Name: N/A  . Number of Children: N/A  . Years of Education: N/A   Social History Main Topics  . Smoking status: Never Smoker   . Smokeless tobacco: Never Used  . Alcohol Use: No  . Drug Use: No  . Sexual Activity: Not on  file   Other Topics Concern  . None   Social History Narrative  . None   Additional Social History:                          Allergies:   Allergies  Allergen Reactions  . Other Other (See Comments)    darvocet--pt reports blistering with this medication  . Penicillins Nausea And Vomiting  . Darvon [Propoxyphene] Other (See Comments)    Blisters    Labs:  Results for orders placed or performed during the hospital encounter of 09/14/14 (from the past 48 hour(s))  Creatinine, serum     Status: None   Collection Time: 10/01/14  4:35 AM  Result Value Ref Range   Creatinine, Ser 0.60 0.44 - 1.00 mg/dL   GFR calc non Af Amer >60 >60 mL/min   GFR calc Af Amer >60 >60 mL/min    Comment: (NOTE) The eGFR has been calculated using the CKD EPI equation. This calculation has not been validated in all clinical situations. eGFR's persistently <60 mL/min signify possible Chronic Kidney Disease.   Hemoglobin     Status: Abnormal   Collection Time: 10/01/14  4:35 AM  Result Value Ref Range   Hemoglobin 10.2 (L) 12.0 - 16.0 g/dL  Glucose, capillary     Status: None   Collection Time: 10/01/14  8:03 AM  Result Value Ref Range   Glucose-Capillary  91 65 - 99 mg/dL  Glucose, capillary     Status: Abnormal   Collection Time: 10/01/14 11:21 AM  Result Value Ref Range   Glucose-Capillary 135 (H) 65 - 99 mg/dL  Glucose, capillary     Status: Abnormal   Collection Time: 10/01/14  4:23 PM  Result Value Ref Range   Glucose-Capillary 198 (H) 65 - 99 mg/dL  Glucose, capillary     Status: Abnormal   Collection Time: 10/01/14  9:34 PM  Result Value Ref Range   Glucose-Capillary 154 (H) 65 - 99 mg/dL   Comment 1 Notify RN   Glucose, capillary     Status: None   Collection Time: 10/02/14  7:47 AM  Result Value Ref Range   Glucose-Capillary 84 65 - 99 mg/dL  Glucose, capillary     Status: Abnormal   Collection Time: 10/02/14 11:10 AM  Result Value Ref Range   Glucose-Capillary 105  (H) 65 - 99 mg/dL  Glucose, capillary     Status: Abnormal   Collection Time: 10/02/14  4:24 PM  Result Value Ref Range   Glucose-Capillary 183 (H) 65 - 99 mg/dL    Vitals: Blood pressure 140/69, pulse 80, temperature 98.6 F (37 C), temperature source Oral, resp. rate 20, height _0  (1.499 m), weight 191.418 kg (422 lb), SpO2 95 %.  Risk to Self: Is patient at risk for suicide?: No Risk to Others:   Prior Inpatient Therapy:   Prior Outpatient Therapy:    Current Facility-Administered Medications  Medication Dose Route Frequency Provider Last Rate Last Dose  . acetaminophen (TYLENOL) tablet 650 mg  650 mg Oral Q6H PRN Juluis Mire, MD   650 mg at 09/25/14 1318   Or  . acetaminophen (TYLENOL) suppository 650 mg  650 mg Rectal Q6H PRN Juluis Mire, MD      . acetaZOLAMIDE (DIAMOX) tablet 250 mg  250 mg Oral BID Flora Lipps, MD   250 mg at 10/02/14 0849  . albuterol (PROVENTIL) (2.5 MG/3ML) 0.083% nebulizer solution 2.5 mg  2.5 mg Nebulization Q6H Henreitta Leber, MD   2.5 mg at 10/02/14 2048  . antiseptic oral rinse (CPC / CETYLPYRIDINIUM CHLORIDE 0.05%) solution 7 mL  7 mL Mouth Rinse BID Loletha Grayer, MD   7 mL at 10/02/14 0850  . apixaban (ELIQUIS) tablet 5 mg  5 mg Oral BID Henreitta Leber, MD   5 mg at 10/02/14 0848  . bisacodyl (DULCOLAX) suppository 10 mg  10 mg Rectal Daily PRN Henreitta Leber, MD   10 mg at 09/28/14 1437  . budesonide (PULMICORT) nebulizer solution 0.5 mg  0.5 mg Nebulization BID Vishal Mungal, MD   0.5 mg at 10/02/14 2048  . guaiFENesin (MUCINEX) 12 hr tablet 600 mg  600 mg Oral BID Loletha Grayer, MD   600 mg at 10/02/14 0848  . insulin aspart (novoLOG) injection 0-20 Units  0-20 Units Subcutaneous TID WC Loletha Grayer, MD   4 Units at 10/02/14 1752  . insulin aspart (novoLOG) injection 0-5 Units  0-5 Units Subcutaneous QHS Loletha Grayer, MD   3 Units at 09/24/14 2249  . insulin glargine (LANTUS) injection 15 Units  15 Units Subcutaneous QHS  Henreitta Leber, MD   15 Units at 10/01/14 2212  . lactulose (CHRONULAC) 10 GM/15ML solution 30 g  30 g Oral Daily PRN Henreitta Leber, MD   30 g at 10/01/14 2213  . LORazepam (ATIVAN) injection 0.5 mg  0.5 mg Intravenous Q6H PRN  Loletha Grayer, MD   0.5 mg at 09/21/14 1329  . morphine CONCENTRATE 10 MG/0.5ML oral solution 5-10 mg  5-10 mg Oral Q2H PRN Loletha Grayer, MD      . ondansetron [facility]) tablet 4 mg  4 mg Oral Q6H PRN Juluis Mire, MD   4 mg at 09/25/14 1941   Or  . ondansetron (ZOFRAN) injection 4 mg  4 mg Intravenous Q6H PRN Juluis Mire, MD   4 mg at 09/19/14 0933  . senna-docusate (Senokot-S) tablet 1 tablet  1 tablet Oral BID Flora Lipps, MD   1 tablet at 10/02/14 0848  . sertraline (ZOLOFT) tablet 100 mg  100 mg Oral Daily Henreitta Leber, MD   100 mg at 10/02/14 0848  . sulfamethoxazole-trimethoprim (BACTRIM DS,SEPTRA DS) 800-160 MG per tablet 2 tablet  2 tablet Oral Q12H Henreitta Leber, MD      . theophylline (THEODUR) 12 hr tablet 200 mg  200 mg Oral Q12H Erby Pian, MD   200 mg at 10/02/14 0848  . tiotropium (SPIRIVA) inhalation capsule 18 mcg  18 mcg Inhalation Daily Juluis Mire, MD   18 mcg at 10/02/14 0849  . traZODone (DESYREL) tablet 100 mg  100 mg Oral QHS Henreitta Leber, MD   100 mg at 10/01/14 2213    Musculoskeletal: Strength & Muscle Tone: decreased Gait & Station: unable to stand Patient leans: N/A  Psychiatric Specialty Exam: Physical Exam  Psychiatric: She has a normal mood and affect. Her speech is normal and behavior is normal. Judgment and thought content normal. Cognition and memory are normal.  Despite her sickness patient is awake and alert and conversant. Affect is reactive and appropriate. Thoughts appear to be clear.    Review of Systems  Psychiatric/Behavioral: Negative for depression, suicidal ideas, hallucinations and substance abuse. The patient is not nervous/anxious and does not have insomnia.     Blood pressure  140/69, pulse 80, temperature 98.6 F (37 C), temperature source Oral, resp. rate 20, height _0  (1.499 m), weight 191.418 kg (422 lb), SpO2 95 %.Body mass index is 85.19 kg/(m^2).  General Appearance: Fairly Groomed  Engineer, water::  Good  Speech:  Slow  Volume:  Decreased  Mood:  Anxious  Affect:  Appropriate  Thought Process:  Goal Directed  Orientation:  Full (Time, Place, and Person)  Thought Content:  Negative  Suicidal Thoughts:  No  Homicidal Thoughts:  No  Memory:  Immediate;   Good Recent;   Fair Remote;   Good  Judgement:  Intact  Insight:  Present  Psychomotor Activity:  Decreased  Concentration:  Fair  Recall:  [employer] of Knowledge:Fair  Language: Fair  Akathisia:  No  Handed:  Right  AIMS (if indicated):     Assets:  Communication Skills Desire for Improvement Resilience Social Support  ADL's:  Impaired  Cognition: WNL  Sleep:      Medical Decision Making: Established Problem, Stable/Improving (1), Review of Psycho-Social Stressors (1) and Review and summation of old records (2)  Treatment Plan Summary: Medication management and Plan Patient is currently on Zoloft 100 mg a day. No new complaints. Mood is stable. No sign of new depressive symptoms. Supportive counseling completed. Plan to continue current medication follow-up intermittently. Patient agrees.  Plan:  No evidence of imminent risk to self or others at present.   Patient does not meet criteria for psychiatric inpatient admission. Supportive therapy provided about ongoing stressors. Disposition: Continue inpatient follow-up  Jenny Reichmann  [place] 10/02/2014 10:02 PM  Psychiatry:Out of the CCU and recovering on the medicine service

## 2014-10-02 NOTE — Plan of Care (Signed)
Problem: Discharge Progression Outcomes Goal: Other Discharge Outcomes/Goals Outcome: Progressing Patient alert and oriented, no c/o pain. Remains on the specialty bed, automatically repositions patient. Able to call when patient needs assistance.

## 2014-10-02 NOTE — Plan of Care (Signed)
Problem: Spiritual Needs Goal: Ability to function at adequate level Outcome: Progressing Plan of Care Progress to Goal: Pt now on 1L during the day.  O2 sats  Are 94%.  Will d/c to [facility] tomorrow - will be able to do bipap there.  Large BM today. Pt worked w/PT today.  Is much better about turning for bedpan - using turning function of bed very helpful.  Need to ask dr if he'll write a script for special bed upon d/c.

## 2014-10-02 NOTE — Progress Notes (Signed)
Physical Therapy Treatment Patient Details Name: Eileen [name] MRN: [id number] DOB: 25-Feb-1946 Today's Date: 10/02/2014    History of Present Illness presented to ER secondary to SOB, cough and hypoxia; admitted with acute/chronic respiratory failure related to COPD exacerbation.  Hospital course significant for further respiratory decline (requiring BiPAP) and runs of a-fib (requiring Cardizem drip, now off) with transition to CCU.  Currently on HFNC at 50%, 50L/min.    PT Comments    Patient now weaned to 1L supplemental O2 via [place]; tolerating supine therex with sats >90%.  Continues to require mod encouragement for participation, full active effort with therapeutic exercise.  Was able to complete full rolling towards L for bedpan placement this date, but requires max assist +2 and assist from mattress surface to complete.  Does appear to have greater effort to assist with mobility when motivated by personal need vs. Therapeutic activity/benefit.    Patient appears to be nearing baseline level of function, minimal progress in functional mobility despite improvement in medical condition.  will attempt 1 additional visit; if status remains grossly same, will discontinue skilled PT services   Follow Up Recommendations  SNF     Equipment Recommendations       Recommendations for Other Services       Precautions / Restrictions Precautions Precautions: Fall Precaution Comments: Contact isolation, droplet isolation Restrictions Weight Bearing Restrictions: No    Mobility  Bed Mobility Overal bed mobility: +2 for physical assistance Bed Mobility: Rolling           General bed mobility comments: towards L; able to complete full roll for bedpan placement with roll assist from bariatric specialty bed this date  Transfers                 General transfer comment: unsafe/unable to tolerate at this time  Ambulation/Gait             General Gait Details:  non-ambulatory at baseline   Stairs            Wheelchair Mobility    Modified Rankin (Stroke Patients Only)       Balance                                    Cognition                            Exercises Other Exercises Other Exercises: Bilat UE supine therex, 1x15, AROM: cervical flexion/chin tucks, rhythmic stabilization (with bilat UEs clasped), D1/D2 flex/ext; bilat LE supine therex, 1x15, AROM: ankle pumps, quad sets, glut sets, hip abduct/adduct, partial SLR (R only). Other Exercises: Rolling towards L x2, max assist +2 for bedpan placement.  Assist from mattress surface required to fully complete change of position.  Patient noted with increased active effort with mobility attempts when needing to toilet (vs. rolling for therapeutic benefit)    General Comments        Pertinent Vitals/Pain      Home Living                      Prior Function            PT Goals (current goals can now be found in the care plan section) Acute Rehab PT Goals Patient Stated Goal: "I need to go to the bathroom" PT Goal Formulation: With patient Time  For Goal Achievement: 10/09/14 Potential to Achieve Goals: Fair Progress towards PT goals: Not progressing toward goals - comment (patient appears to be nearing baseline level of function, minimal progress in functional mobility despite improvement in medical condition.  will attempt 1 additional visit; if status remains grossly same, will discontinue skilled PT services)    Frequency  Min 2X/week    PT Plan Current plan remains appropriate    Co-evaluation             End of Session   Activity Tolerance: Patient tolerated treatment well Patient left: in bed;with call bell/phone within reach     Time: 0917-0943 PT Time Calculation (min) (ACUTE ONLY): 26 min  Charges:  $Therapeutic Exercise: 8-22 mins $Therapeutic Activity: 8-22 mins                    G Codes:      Artem Bunte  H. Manson Passey, PT, DPT, NCS 10/02/2014, 9:51 AM [phone]

## 2014-10-02 NOTE — Care Management Important Message (Signed)
Important Message  Patient Details  Name: Eileen [name] MRN: [id number] Date of Birth: 1946-02-02   Medicare Important Message Given:   (IM given)    Gwenette Greet 10/02/2014, 10:07 AM

## 2014-10-02 NOTE — Clinical Social Work Note (Signed)
Pt will be ready for discharge tomorrow. Pt's BiPAP setting are managable by [facility] and they have ordered the BiPAP. Facility is waiting for the BiPAP arrival. CSW spoke with MD, and pt will discharge tomorrow as it is important that BiPAP is in place prior to discharge. CSW updated facility and they will be able to accept pt tomorrow. CSW also updated [facility]. CSW spoke with pt, who is agreeable to discharge plan. CSW will continue to follow.   Dede Query, MSW, LCSW Clinical Social Worker  [phone]

## 2014-10-02 NOTE — Progress Notes (Addendum)
Nutrition Follow-up  INTERVENTION:   Meals and Snacks: Cater to patient preferences; continue snacks as ordered may discontinue if po intake of meals adequate Coordination of Care: Continue to monitor bowel regimen as lactulose ordered prn last given last night, (last BM 09/28/2014)   NUTRITION DIAGNOSIS:   Inadequate oral intake related to acute illness as evidenced by meal completion < 25%; improved as pt eating <50% of meals  GOAL:   Patient will meet greater than or equal to 90% of their needs; improved, continue goal  MONITOR:    (Energy Intake, Pulmonary, Anthropometrics, Electrolyte/Renal Profile, Glucose Profile)  ASSESSMENT:   Pt continues on Bipap at night weaning FiO2 per MD note. Pt awaiting discharge to SNF when bed available. Pt asleep on visit.  Diet Order:  Diet regular Room service appropriate?: Yes; Fluid consistency:: Thin    Current Nutrition: Pt with a good appetite this am ate 100% of breakfast. Limited documentation on I/O chart. RD notes last documentation of po intake on average 84% of meals from 7/17-7/21)   Gastrointestinal Profile: Last BM: 09/28/2014   Medications: Lactulose, Novolog, Lantus, Prednisone  Electrolyte/Renal Profile and Glucose Profile:   Recent Labs Lab 09/26/14 0938 09/27/14 0513 09/28/14 0515 10/01/14 0435  NA 132*  --  136  --   K 4.0  --  4.0  --   CL 98*  --  105  --   CO2 25  --  27  --   BUN 32*  --  23*  --   CREATININE 0.74 0.69 0.70 0.60  CALCIUM 7.9*  --  8.0*  --   MG 1.8  --  1.9  --   PHOS 3.1  --  3.9  --   GLUCOSE 188*  --  126*  --    Protein Profile: No results for input(s): ALBUMIN in the last 168 hours.   Weight Trend since Admission: Filed Weights   09/30/14 0500 10/01/14 0500 10/02/14 0500  Weight: 427 lb 11.2 oz (194.003 kg) 421 lb 9.6 oz (191.237 kg) 422 lb (191.418 kg)     Skin:   (stage I pressure ulcer)   Height:   Ht Readings from Last 1 Encounters:  09/17/14  (1.499 m)     Weight:   Wt Readings from Last 1 Encounters:  10/02/14 422 lb (191.418 kg)    Ideal Body Weight:   44kg  Wt Readings from Last 10 Encounters:  10/02/14 422 lb (191.418 kg)    BMI:  Body mass index is 85.19 kg/(m^2).  Estimated Nutritional Needs:   Kcal:  Using IBW of 44kg (BEE 865 kcals (IF 1.0-1.2, Af 1.2) 7829-5621 kcals/d  Protein:  Using IBW of 44kg (1.0-1.2 g/kg) 44-53 gm/d  Fluid:  Using IBW of 44kg (25-20ml/kg)1100-1320ml/d  EDUCATION NEEDS:   No education needs identified at this time  LOW Care Level  Leda Quail, RD, LDN Pager [phone]

## 2014-10-02 NOTE — Clinical Social Work Note (Signed)
BiPAP setting updates has been sent to [facility], pt's only potential bed offer. Per report, pt is ready for discharge today. [facility] (for [facility]) is aware of possible discharge. Awaiting to confirm bed offer from [facility]. CSW will continue to follow.   Dede Query, MSW, LCSW Clinical Socia Worker  [phone]

## 2014-10-02 NOTE — Progress Notes (Signed)
[facility] - [place] at [facility]   PATIENT NAME: Eileen [name]    MR#:  [id number]  DATE OF BIRTH:  [birth year]  SUBJECTIVE:  CHIEF COMPLAINT:   Chief Complaint  Patient presents with  . Shortness of Breath   Slept well on Bipap.  No acute issues overnight.  O2 with Bipap down down to 22%.  Awaiting discharge to SNF.    REVIEW OF SYSTEMS:   Review of Systems  Constitutional: Negative for fever and chills.  HENT: Negative for congestion and tinnitus.   Eyes: Negative for blurred vision and double vision.  Respiratory: Negative for cough, shortness of breath and wheezing.   Cardiovascular: Negative for chest pain, orthopnea and PND.  Gastrointestinal: Negative for nausea, vomiting, abdominal pain, diarrhea and constipation.  Genitourinary: Negative for dysuria and hematuria.  Neurological: Negative for dizziness, sensory change and focal weakness.  All other systems reviewed and are negative.   DRUG ALLERGIES:   Allergies  Allergen Reactions  . Other Other (See Comments)    darvocet--pt reports blistering with this medication  . Penicillins Nausea And Vomiting  . Darvon [Propoxyphene] Other (See Comments)    Blisters    VITALS:  Blood pressure 125/54, pulse 84, temperature 99.5 F (37.5 C), temperature source Axillary, resp. rate 18, height 4\' 11"  (1.499 m), weight 191.418 kg (422 lb), SpO2 93 %.  PHYSICAL EXAMINATION:   GENERAL:  69 y.o.-year-old morbidly obsese patient lying in the bed in no apparent distress. EYES: Pupils equal, round, reactive to light and accommodation. No scleral icterus. Extraocular muscles intact.  HEENT: Head atraumatic, normocephalic. Oropharynx and nasopharynx clear. Most oral mucosa NECK:  Supple, no jugular venous distention. No thyroid enlargement, no tenderness.  LUNGS: No dullness to percussion. Negative use of accessory muscles. No rales or rhonchi noted wheezing anteriorly. CARDIOVASCULAR: S1, S2 normal.  No murmurs, rubs, or gallops.  ABDOMEN: Soft, nontender, nondistended. Bowel sounds present. No organomegaly or mass.  EXTREMITIES: +1 pedal edema b/l, No cyanosis, or clubbing.  NEUROLOGIC: Cranial nerves II through XII are intact. No focal motor or sensory deficits bilaterally. Globally weak.  PSYCHIATRIC: The patient is alert and oriented x 3. Globally weak  SKIN: No obvious rash, lesion, or ulcer.    LABORATORY PANEL:   CBC  Recent Labs Lab 10/01/14 0435  HGB 10.2*   ------------------------------------------------------------------------------------------------------------------  Chemistries   Recent Labs Lab 09/28/14 0515 10/01/14 0435  NA 136  --   K 4.0  --   CL 105  --   CO2 27  --   GLUCOSE 126*  --   BUN 23*  --   CREATININE 0.70 0.60  CALCIUM 8.0*  --   MG 1.9  --    ------------------------------------------------------------------------------------------------------------------  Cardiac Enzymes No results for input(s): TROPONINI in the last 168 hours. ------------------------------------------------------------------------------------------------------------------  RADIOLOGY:  No results found.   ASSESSMENT AND PLAN:   69 year old female with past medical history of morbid obesity, obstructive sleep apnea, COPD, osteoarthritis, obesity pickwickian syndrome, chronic atrial fibrillation who presented to the hospital due to shortness of breath and noted to be in acute on chronic respiratory failure.  #1 acute on chronic hypoxic hypercarbic respiratory failure-this is likely secondary to patient's obesity pickwickian syndrome complicated with sleep apnea and COPD exacerbation/MRSA pneumonia. - Finished Pred. taper, cont. DuoNeb as needed, Spiriva, and switched to Bactrim yesterday.  -Continue BiPAP with naps and at night. FiO2 w/ Bipap weaned to 22%.    #2 COPD exacerbation-likely secondary to underlying pneumonia. -finished  pred. taper, cont. DuoNeb's,  Spiriva, Pulmicort -cont. Bactrim for 2 more days.  -Continue BiPAP support.  #3 chronic afibrillation-rate controlled.   -Continue Eliquis  #4 MRSA pneumonia-likely present on admission and the cause of her COPD exacerbation and acute respiratory failure. -was on IV Vancomycin and switched to Oral Bactrim yesterday and will cont. For 2 more days.  - much improved.   #5 depression-continue Zoloft.  #6 morbid obesity with obesity pickwickian syndrome-continue BiPAP support.  #7 thrombocytopenia-seems chronic for the patient. Etiology unclear. -No acute bleeding.  Follow-up with hematology as outpatient.  #8 hyperglycemia-likely secondary to steroids. Much Improved. - Continue sliding scale insulin, Lantus.   #9 constipation- resolved and cont. Senakot, lactulose.      Seen by PT and they recommend SNF and CM aware and doing bed search.    All the records are reviewed and case discussed with Care Management/Social Workerr. Management plans discussed with the patient, family and they are in agreement.  CODE STATUS: DO NOT RESUSCITATE  DVT Prophylaxis - Eliquis.   TOTAL TIME TAKING CARE OF THIS PATIENT: 30 minutes.  Possible DC to skilled nursing facility when bed available.  Houston Siren M.D on 10/02/2014 at 1:13 PM  Between 7am to 6pm - Pager - [phone]  After 6pm go to [email or web address] - password [email or web address]  [facility]  Office  [phone]  CC: Primary care physician; Pcp Not In System

## 2014-10-02 NOTE — Discharge Summary (Addendum)
[facility] Physicians -  at [facility]   PATIENT NAME: Eileen [name]    MR#:  [id number]  DATE OF BIRTH:  06/29/1945  DATE OF ADMISSION:  09/14/2014 ADMITTING PHYSICIAN: Crissie Figures, MD  DATE OF DISCHARGE: 10/03/2014  PRIMARY CARE PHYSICIAN: Pcp Not In System    ADMISSION DIAGNOSIS:  COPD exacerbation [J44.1]  DISCHARGE DIAGNOSIS:  Principal Problem:   Acute on chronic respiratory failure with hypoxia Active Problems:   COPD exacerbation   Pressure ulcer   Acute delirium   SECONDARY DIAGNOSIS:   Past Medical History  Diagnosis Date  . COPD (chronic obstructive pulmonary disease)   . Arthritis     HOSPITAL COURSE:   69 year old female with past medical history of morbid obesity, obstructive sleep apnea, COPD, osteoarthritis, obesity pickwickian syndrome, chronic atrial fibrillation who presented to the hospital due to shortness of breath and noted to be in acute on chronic respiratory failure.  #1 acute on chronic hypoxic hypercarbic respiratory failure-this was likely due to secondary to patient's obesity pickwickian syndrome complicated with sleep apnea and COPD exacerbation/MRSA pneumonia. -Patient was treated aggressively with BiPAP support, IV steroids, DuoNeb's, Pulmicort nebs and has clinically improved. Patient has not finished a prednisone taper. -Patient was treated with IV vancomycin for her MRSA pneumonia and now has been switched over to oral Bactrim and is being discharged on that. -Patient does benefit from BiPAP with sleep and also with times and she will continue that. Her clinical status has significantly improved since admission. - use bipap at night.  #2 COPD exacerbation-likely secondary to underlying MRSA pneumonia. -Patient was treated aggressively with IV steroids, DuoNeb's around-the-clock, Pulmicort nebs, theophylline and has significantly improved. She has no more wheezing, bronchospasm. -She was weaned off BiPAP to  high flow nasal cannula and now is just on 3-4 L nasal cannula doing well. She requires BiPAP support at night and also with naps and she will continue that. -Patient has finished a prednisone taper and has a few more days of oral Bactrim left for treatment for underlying pneumonia  #3 chronic afibrillation-rate controlled.  -Continue Eliquis  #4 MRSA pneumonia-likely present on admission and the cause of her COPD exacerbation and acute respiratory failure. -was on IV Vancomycin and switched to Oral Bactrim on 10/01/2014 and will cont. For 2 more days.  -Clinically much improved  #5 depression-continue Zoloft.  #6 morbid obesity with obesity pickwickian syndrome-continue BiPAP support.  #7 thrombocytopenia-seems chronic for the patient. Etiology unclear. -No acute bleeding. Follow-up with hematology as outpatient.  #8 hyperglycemia with type 2 diabetes-much improved. - Continue sliding scale insulin, Lantus.   #9 constipation- resolved and cont. Senakot, lactulose, Colace.   Patient likely needs long-term care to skilled nursing facility which is where she is presently being discharged.  DISCHARGE CONDITIONS:   Stable  CONSULTS OBTAINED:  Treatment Team:  Stephanie Acre, MD Audery Amel, MD  DRUG ALLERGIES:   Allergies  Allergen Reactions  . Other Other (See Comments)    darvocet--pt reports blistering with this medication  . Penicillins Nausea And Vomiting  . Darvon [Propoxyphene] Other (See Comments)    Blisters    DISCHARGE MEDICATIONS:   Current Discharge Medication List    START taking these medications   Details  acetaZOLAMIDE (DIAMOX) 250 MG tablet Take 1 tablet (250 mg total) by mouth 2 (two) times daily.    albuterol (PROVENTIL) (2.5 MG/3ML) 0.083% nebulizer solution Take 3 mLs (2.5 mg total) by nebulization every 6 (six) hours. Qty: 75  mL, Refills: 12    bisacodyl (DULCOLAX) 10 MG suppository Place 1 suppository (10 mg total) rectally daily as  needed for moderate constipation. Qty: 12 suppository, Refills: 0    !! insulin aspart (NOVOLOG) 100 UNIT/ML injection Inject 0-20 Units into the skin 3 (three) times daily with meals. Qty: 10 mL, Refills: 11    !! insulin aspart (NOVOLOG) 100 UNIT/ML injection Inject 0-5 Units into the skin at bedtime. Qty: 10 mL, Refills: 11    insulin glargine (LANTUS) 100 UNIT/ML injection Inject 0.15 mLs (15 Units total) into the skin at bedtime. Qty: 10 mL, Refills: 11    lactulose (CHRONULAC) 10 GM/15ML solution Take 45 mLs (30 g total) by mouth daily as needed for mild constipation. Qty: 240 mL, Refills: 0    senna-docusate (SENOKOT-S) 8.6-50 MG per tablet Take 1 tablet by mouth 2 (two) times daily.    sulfamethoxazole-trimethoprim (BACTRIM DS,SEPTRA DS) 800-160 MG per tablet Take 2 tablets by mouth every 12 (twelve) hours.    theophylline (THEODUR) 200 MG 12 hr tablet Take 1 tablet (200 mg total) by mouth every 12 (twelve) hours.    tiotropium (SPIRIVA) 18 MCG inhalation capsule Place 1 capsule (18 mcg total) into inhaler and inhale daily. Qty: 30 capsule, Refills: 12     !! - Potential duplicate medications found. Please discuss with provider.    CONTINUE these medications which have CHANGED   Details  LORazepam (ATIVAN) 1 MG tablet Take 1 tablet (1 mg total) by mouth every 8 (eight) hours as needed for anxiety. Qty: 30 tablet, Refills: 0      CONTINUE these medications which have NOT CHANGED   Details  apixaban (ELIQUIS) 5 MG TABS tablet Take 5 mg by mouth 2 (two) times daily.    docusate sodium (COLACE) 100 MG capsule Take 200 mg by mouth 2 (two) times daily.    furosemide (LASIX) 40 MG tablet Take 40 mg by mouth daily.    sertraline (ZOLOFT) 50 MG tablet Take 100 mg by mouth daily.    traZODone (DESYREL) 100 MG tablet Take 100 mg by mouth at bedtime.         DISCHARGE INSTRUCTIONS:   Use BIPAP at night- setting- Insp Pressure- 16, Exp pressure 10, Respi rate- 10, Oxygen  supply 22%.  DIET:  Cardiac diet and Diabetic diet  DISCHARGE CONDITION:  Stable  ACTIVITY:  Activity as tolerated  OXYGEN:  Home Oxygen: Yes.     Oxygen Delivery: 3 liters/min via Patient connected to nasal cannula oxygen   Use BIPAP at night- setting- Insp Pressure- 16, Exp pressure 10, Respi rate- 10, Oxygen supply 22%.  DISCHARGE LOCATION:  nursing home   If you experience worsening of your admission symptoms, develop shortness of breath, life threatening emergency, suicidal or homicidal thoughts you must seek medical attention immediately by calling 911 or calling your MD immediately  if symptoms less severe.  You Must read complete instructions/literature along with all the possible adverse reactions/side effects for all the Medicines you take and that have been prescribed to you. Take any new Medicines after you have completely understood and accpet all the possible adverse reactions/side effects.   Please note  You were cared for by a hospitalist during your hospital stay. If you have any questions about your discharge medications or the care you received while you were in the hospital after you are discharged, you can call the unit and asked to speak with the hospitalist on call if the hospitalist that took  care of you is not available. Once you are discharged, your primary care physician will handle any further medical issues. Please note that NO REFILLS for any discharge medications will be authorized once you are discharged, as it is imperative that you return to your primary care physician (or establish a relationship with a primary care physician if you do not have one) for your aftercare needs so that they can reassess your need for medications and monitor your lab values.   Today   Feels better, has no shortness of breath. No acute events overnight.  VITAL SIGNS:  Blood pressure 140/69, pulse 80, temperature 98.6 F (37 C), temperature source Oral, resp. rate 20,  height 4\' 11"  (1.499 m), weight 191.418 kg (422 lb), SpO2 95 %.  I/O:   Intake/Output Summary (Last 24 hours) at 10/02/14 1356 Last data filed at 10/02/14 0738  Gross per 24 hour  Intake      0 ml  Output    450 ml  Net   -450 ml    PHYSICAL EXAMINATION:   GENERAL: 69 y.o.-year-old morbidly obsese patient lying in the bed in no apparent distress. EYES: Pupils equal, round, reactive to light and accommodation. No scleral icterus. Extraocular muscles intact.  HEENT: Head atraumatic, normocephalic. Oropharynx and nasopharynx clear. Most oral mucosa NECK: Supple, no jugular venous distention. No thyroid enlargement, no tenderness.  LUNGS: No dullness to percussion. Negative use of accessory muscles. No rales or rhonchi noted wheezing anteriorly. CARDIOVASCULAR: S1, S2 normal. No murmurs, rubs, or gallops.  ABDOMEN: Soft, nontender, nondistended. Bowel sounds present. No organomegaly or mass.  EXTREMITIES: +1 pedal edema b/l, No cyanosis, or clubbing.  NEUROLOGIC: Cranial nerves II through XII are intact. No focal motor or sensory deficits bilaterally. Globally weak.  PSYCHIATRIC: The patient is alert and oriented x 3. Globally weak  SKIN: No obvious rash, lesion, or ulcer.  DATA REVIEW:   CBC  Recent Labs Lab 10/01/14 0435  HGB 10.2*    Chemistries   Recent Labs Lab 09/28/14 0515 10/01/14 0435  NA 136  --   K 4.0  --   CL 105  --   CO2 27  --   GLUCOSE 126*  --   BUN 23*  --   CREATININE 0.70 0.60  CALCIUM 8.0*  --   MG 1.9  --     Cardiac Enzymes No results for input(s): TROPONINI in the last 168 hours.  Microbiology Results  Results for orders placed or performed during the hospital encounter of 09/14/14  MRSA PCR Screening     Status: None   Collection Time: 09/14/14  7:50 PM  Result Value Ref Range Status   MRSA by PCR NEGATIVE NEGATIVE Final    Comment:        The GeneXpert MRSA Assay (FDA approved for NASAL specimens only), is one component  of a comprehensive MRSA colonization surveillance program. It is not intended to diagnose MRSA infection nor to guide or monitor treatment for MRSA infections.   Culture, expectorated sputum-assessment     Status: None   Collection Time: 09/18/14  6:15 PM  Result Value Ref Range Status   Specimen Description SPUTUM  Final   Special Requests NONE  Final   Sputum evaluation THIS SPECIMEN IS ACCEPTABLE FOR SPUTUM CULTURE  Final   Report Status 09/19/2014 FINAL  Final  Culture, respiratory (NON-Expectorated)     Status: None   Collection Time: 09/18/14  6:15 PM  Result Value Ref Range Status  Specimen Description SPUTUM  Final   Special Requests NONE Reflexed from [id number]  Final   Gram Stain   Final    FAIR SPECIMEN - 70-80% WBCS FEW WBC SEEN RARE GRAM POSITIVE COCCI IN CLUSTERS    Culture   Final    HEAVY GROWTH METHICILLIN RESISTANT STAPHYLOCOCCUS AUREUS CRITICAL RESULT CALLED TO, READ BACK BY AND VERIFIED WITH: PAM MEYERS AT 1218 09/21/14 DV    Report Status 09/21/2014 FINAL  Final   Organism ID, Bacteria METHICILLIN RESISTANT STAPHYLOCOCCUS AUREUS  Final      Susceptibility   Methicillin resistant staphylococcus aureus - MIC*    CIPROFLOXACIN 2 INTERMEDIATE Intermediate     GENTAMICIN <=0.5 SENSITIVE Sensitive     OXACILLIN >=4 RESISTANT Resistant     VANCOMYCIN 1 SENSITIVE Sensitive     TRIMETH/SULFA <=10 SENSITIVE Sensitive     CEFOXITIN SCREEN Value in next row Resistant      POSITIVECEFOXITIN SCREEN - This test may be used to predict mecA-mediated oxacillin resistance, and it is based on the cefoxitin disk screen test.  The cefoxitin screen and oxacillin work in combination to determine the final interpretation reported for oxacillin.     Inducible Clindamycin Value in next row Resistant      POSITIVEINDUCIBLE CLINDAMYCIN RESISTANCE - A positive ICR test is indicative of inducible resistance to macrolides, lincosamides, and type B streptogramin.  This isolate is presumed  to be resistant to Clindamycin, however, Clindamycin may still be effective in some patients.     TETRACYCLINE Value in next row Sensitive      SENSITIVE<=1    * HEAVY GROWTH METHICILLIN RESISTANT STAPHYLOCOCCUS AUREUS    RADIOLOGY:  No results found.   Management plans discussed with the patient, family and they are in agreement.  CODE STATUS:     Code Status Orders        Start     Ordered   09/14/14 1323  Do not attempt resuscitation (DNR)   Continuous    Question Answer Comment  In the event of cardiac or respiratory ARREST Do not call a "code blue"   In the event of cardiac or respiratory ARREST Do not perform Intubation, CPR, defibrillation or ACLS   In the event of cardiac or respiratory ARREST Use medication by any route, position, wound care, and other measures to relive pain and suffering. May use oxygen, suction and manual treatment of airway obstruction as needed for comfort.      09/14/14 1322    Advance Directive Documentation        Most Recent Value   Type of Advance Directive  Healthcare Power of Attorney, Living will   Pre-existing out of facility DNR order (yellow form or pink MOST form)     "MOST" Form in Place?        TOTAL TIME TAKING CARE OF THIS PATIENT: 40 minutes.    Houston Siren M.D on 10/02/2014 at 1:56 PM  Between 7am to 6pm - Pager - [phone]  After 6pm go to [email or web address] - password [email or web address]  [facility]  Office  [phone]  CC: Primary care physician; Pcp Not In System

## 2014-10-03 DIAGNOSIS — D696 Thrombocytopenia, unspecified: Secondary | ICD-10-CM

## 2014-10-03 DIAGNOSIS — E8809 Other disorders of plasma-protein metabolism, not elsewhere classified: Secondary | ICD-10-CM

## 2014-10-03 DIAGNOSIS — D638 Anemia in other chronic diseases classified elsewhere: Secondary | ICD-10-CM

## 2014-10-03 DIAGNOSIS — E669 Obesity, unspecified: Secondary | ICD-10-CM

## 2014-10-03 DIAGNOSIS — E119 Type 2 diabetes mellitus without complications: Secondary | ICD-10-CM

## 2014-10-03 DIAGNOSIS — M1990 Unspecified osteoarthritis, unspecified site: Secondary | ICD-10-CM

## 2014-10-03 DIAGNOSIS — Z515 Encounter for palliative care: Secondary | ICD-10-CM

## 2014-10-03 DIAGNOSIS — J15212 Pneumonia due to Methicillin resistant Staphylococcus aureus: Principal | ICD-10-CM

## 2014-10-03 DIAGNOSIS — I2510 Atherosclerotic heart disease of native coronary artery without angina pectoris: Secondary | ICD-10-CM

## 2014-10-03 DIAGNOSIS — Z66 Do not resuscitate: Secondary | ICD-10-CM

## 2014-10-03 LAB — GLUCOSE, CAPILLARY
GLUCOSE-CAPILLARY: 65 mg/dL (ref 65–99)
Glucose-Capillary: 128 mg/dL — ABNORMAL HIGH (ref 65–99)

## 2014-10-03 NOTE — Plan of Care (Signed)
Problem: Discharge Progression Outcomes Goal: Other Discharge Outcomes/Goals Outcome: Progressing Plan of care progress to goal Pt rested well. Tolerated bipap. 02 at 1liter otherwise. No c/o pain.Possible discharge to [facility] today.

## 2014-10-03 NOTE — Progress Notes (Signed)
Inpatient Diabetes Program Recommendations  AACE/ADA: New Consensus Statement on Inpatient Glycemic Control (2013)  Target Ranges:  Prepandial:   less than 140 mg/dL      Peak postprandial:   less than 180 mg/dL (1-2 hours)      Critically ill patients:  140 - 180 mg/dL   Results for MELVINE, JULIN (MRN [id number]) as of 10/03/2014 10:32  Ref. Range 10/02/2014 07:47 10/02/2014 11:10 10/02/2014 16:24 10/02/2014 21:59 10/03/2014 07:13  Glucose-Capillary Latest Ref Range: 65-99 mg/dL 84 956 (H) 213 (H) 086 (H) 65    Diabetes history: No Outpatient Diabetes medications: NA Current orders for Inpatient glycemic control: Novolog 0-20 units TID with meals, Novolog 0-5 units HS, Lantus 15 units QHS  Inpatient Diabetes Program Recommendations Insulin - Basal: Noted patient is no longer on steroids and fasting glucose 65 mg/dl this morning. Please consider decreasing Lantus to 10 units QHS and continue to taper down.  Thanks, Orlando Penner, RN, MSN, CCRN, CDE Diabetes Coordinator Inpatient Diabetes Program [phone] (Team Pager from 8am to 5pm) [phone] (AP office) [phone] [facility] office) [phone] [facility] office)

## 2014-10-03 NOTE — Progress Notes (Signed)
[facility] Physicians - [place] at [facility]   PATIENT NAME: Eileen [name]    MR#:  [id number]  DATE OF BIRTH:  1946/01/02  SUBJECTIVE:  CHIEF COMPLAINT:   Chief Complaint  Patient presents with  . Shortness of Breath   Slept well on Bipap.  No acute issues overnight.  O2 with Bipap down down to 22%.  Awaiting discharge to SNF.  Likely later today.  REVIEW OF SYSTEMS:   Review of Systems  Constitutional: Negative for fever and chills.  HENT: Negative for congestion and tinnitus.   Eyes: Negative for blurred vision and double vision.  Respiratory: Negative for cough, shortness of breath and wheezing.   Cardiovascular: Negative for chest pain, orthopnea and PND.  Gastrointestinal: Negative for nausea, vomiting, abdominal pain, diarrhea and constipation.  Genitourinary: Negative for dysuria and hematuria.  Neurological: Negative for dizziness, sensory change and focal weakness.  All other systems reviewed and are negative.   DRUG ALLERGIES:   Allergies  Allergen Reactions  . Other Other (See Comments)    darvocet--pt reports blistering with this medication  . Penicillins Nausea And Vomiting  . Darvon [Propoxyphene] Other (See Comments)    Blisters    VITALS:  Blood pressure 100/70, pulse 86, temperature 98.2 F (36.8 C), temperature source Oral, resp. rate 18, height 4\' 11"  (1.499 m), weight 188.243 kg (415 lb), SpO2 96 %.  PHYSICAL EXAMINATION:   GENERAL:  69 y.o.-year-old morbidly obsese patient lying in the bed in no apparent distress. EYES: Pupils equal, round, reactive to light and accommodation. No scleral icterus. Extraocular muscles intact.  HEENT: Head atraumatic, normocephalic. Oropharynx and nasopharynx clear. Most oral mucosa NECK:  Supple, no jugular venous distention. No thyroid enlargement, no tenderness.  LUNGS: No dullness to percussion. Negative use of accessory muscles. No rales or rhonchi noted wheezing anteriorly. Using  supplemental oxygen. CARDIOVASCULAR: S1, S2 normal. No murmurs, rubs, or gallops.  ABDOMEN: Soft, nontender, nondistended. Bowel sounds present. No organomegaly or mass.  EXTREMITIES: +1 pedal edema b/l, No cyanosis, or clubbing.  NEUROLOGIC: Cranial nerves II through XII are intact. No focal motor or sensory deficits bilaterally. Globally weak.  PSYCHIATRIC: The patient is alert and oriented x 3. Globally weak  SKIN: No obvious rash, lesion, or ulcer.    LABORATORY PANEL:   CBC  Recent Labs Lab 10/01/14 0435  HGB 10.2*   ------------------------------------------------------------------------------------------------------------------  Chemistries   Recent Labs Lab 09/28/14 0515 10/01/14 0435  NA 136  --   K 4.0  --   CL 105  --   CO2 27  --   GLUCOSE 126*  --   BUN 23*  --   CREATININE 0.70 0.60  CALCIUM 8.0*  --   MG 1.9  --    ------------------------------------------------------------------------------------------------------------------  Cardiac Enzymes No results for input(s): TROPONINI in the last 168 hours. ------------------------------------------------------------------------------------------------------------------  RADIOLOGY:  No results found.   ASSESSMENT AND PLAN:   69 year old female with past medical history of morbid obesity, obstructive sleep apnea, COPD, osteoarthritis, obesity pickwickian syndrome, chronic atrial fibrillation who presented to the hospital due to shortness of breath and noted to be in acute on chronic respiratory failure.  #1 acute on chronic hypoxic hypercarbic respiratory failure-this is likely secondary to patient's obesity pickwickian syndrome complicated with sleep apnea and COPD exacerbation/MRSA pneumonia. - Finished Pred. taper, cont. DuoNeb as needed, Spiriva, and switched to Bactrim yesterday.  -Continue BiPAP with naps and at night. FiO2 w/ Bipap weaned to 22%.   - discharge to [facility]  today.  #2 COPD  exacerbation-likely secondary to underlying pneumonia. -finished pred. taper, cont. DuoNeb's, Spiriva, Pulmicort -cont. Bactrim for 2 more days.  -Continue BiPAP support.  #3 chronic afibrillation-rate controlled.   -Continue Eliquis  #4 MRSA pneumonia-likely present on admission and the cause of her COPD exacerbation and acute respiratory failure. -was on IV Vancomycin and switched to Oral Bactrim yesterday and will cont. For 2 more days.  - much improved.   #5 depression-continue Zoloft.  #6 morbid obesity with obesity pickwickian syndrome-continue BiPAP support.  #7 thrombocytopenia-seems chronic for the patient. Etiology unclear. -No acute bleeding.  Follow-up with hematology as outpatient.  #8 hyperglycemia-likely secondary to steroids. Much Improved. - Continue sliding scale insulin, Lantus.   #9 constipation- resolved and cont. Senakot, lactulose.      Seen by PT and they recommend SNF and CM aware , d/c later today.  All the records are reviewed and case discussed with Care Management/Social Workerr. Management plans discussed with the patient, family and they are in agreement.  CODE STATUS: DO NOT RESUSCITATE  DVT Prophylaxis - Eliquis.   TOTAL TIME TAKING CARE OF THIS PATIENT: 30 minutes.  Possible DC to skilled nursing facility when bed available.  Altamese Dilling M.D on 10/03/2014 at 11:20 AM  Between 7am to 6pm - Pager - [phone]  After 6pm go to [email or web address] - password [email or web address]  [facility]  Office  [phone]  CC: Primary care physician; Pcp Not In System

## 2014-10-03 NOTE — Clinical Social Work Note (Signed)
Pt is ready for discharge today to [facility]. [facility] has given auth for transfer. Pt is aware of copays and agreeable, per Lead CSW. Facility is ready to admit pt as they have obtained BiPAP, Bariatric bed, and discharge summary with BiPAP setting. Pt is agreeable to discharge plan. CSW attempted to reach pt's husband, however was not able to leave a message at either number. CSW also updated [facility] DSS APS worker. CSW is signing off as no further needs identified.   Dede Query, MSW, LCSW Clinical Social Worker  [phone]

## 2014-10-03 NOTE — Clinical Social Work Placement (Signed)
   CLINICAL SOCIAL WORK PLACEMENT  NOTE  Date:  10/03/2014  Patient Details  Name: Eileen [name] MRN: [id number] Date of Birth: 02/17/1946  Clinical Social Work is seeking post-discharge placement for this patient at the Skilled  Nursing Facility level of care (*CSW will initial, date and re-position this form in  chart as items are completed):  Yes   Patient/family provided with [facility] Clinical Social Work Department's list of facilities offering this level of care within the geographic area requested by the patient (or if unable, by the patient's family).  Yes   Patient/family informed of their freedom to choose among providers that offer the needed level of care, that participate in Medicare, Medicaid or managed care program needed by the patient, have an available bed and are willing to accept the patient.  Yes   Patient/family informed of 's ownership interest in [facility] and [facility], as well as of the fact that they are under no obligation to receive care at these facilities.  PASRR submitted to EDS on 09/18/14     PASRR number received on 09/18/14     Existing PASRR number confirmed on       FL2 transmitted to all facilities in geographic area requested by pt/family on 09/18/14     FL2 transmitted to all facilities within larger geographic area on       Patient informed that his/her managed care company has contracts with or will negotiate with certain facilities, including the following:        Yes   Patient/family informed of bed offers received.  Patient chooses bed at [facility]     Physician recommends and patient chooses bed at      Patient to be transferred to [facility] on 10/03/14.  Patient to be transferred to facility by EMS     Patient family notified on 10/03/14 of transfer.  Name of family member notified:  husband     PHYSICIAN Please sign FL2     Additional Comment:     _______________________________________________ Ned Card, LCSW 10/03/2014, 11:09 AM

## 2014-10-03 NOTE — Plan of Care (Signed)
Problem: Discharge Progression Outcomes Goal: Other Discharge Outcomes/Goals Outcome: Adequate for Discharge Patient had no c/o pain this shift O2 sats in the mid 90's on 4 liters  VSS, Received order to discharge patient to [facility] today, report called to [facility] [employer], patient transported in stretcher to room 73 at [facility]

## 2014-10-03 NOTE — Care Management Important Message (Signed)
Important Message  Patient Details  Name: Eileen [name] MRN: [id number] Date of Birth: Aug 23, 1945   Medicare Important Message Given:  Other (see comment) (IM given)    Gwenette Greet 10/03/2014, 1:12 PM

## 2014-10-03 NOTE — Consult Note (Signed)
Palliative Medicine Inpatient Consult Follow Up Note   Name: Eileen [name] Date: 10/03/2014 MRN: [id number]  DOB: 1945-11-08  Referring Physician: Altamese Dilling, MD  Palliative Care consult requested for this 69 y.o. female for goals of medical therapy in patient with acute on chronic respiratory failure due to MRSA pneumonia and advanced COPD and obesity related restrictive lung disease.     REVIEW OF SYSTEMS:  She feels cold --the room is chilly.   CODE STATUS: DNR --Eileen [name] form is confirmed to be in the packet going to facility.    PAST MEDICAL HISTORY: Past Medical History  Diagnosis Date  . COPD (chronic obstructive pulmonary disease)   . Arthritis     PAST SURGICAL HISTORY:  Past Surgical History  Procedure Laterality Date  . Laminectomy    . Back surgery      Vital Signs: BP 134/75 mmHg  Pulse 54  Temp(Src) 97.9 F (36.6 C) (Oral)  Resp 18  Ht 4\' 11"  (1.499 m)  Wt 188.243 kg (415 lb)  BMI 83.78 kg/m2  SpO2 94% Filed Weights   10/01/14 0500 10/02/14 0500 10/03/14 0500  Weight: 191.237 kg (421 lb 9.6 oz) 191.418 kg (422 lb) 188.243 kg (415 lb)    Estimated body mass index is 83.78 kg/(m^2) as calculated from the following:   Height as of this encounter: 4\' 11"  (1.499 m).   Weight as of this encounter: 188.243 kg (415 lb).  PHYSICAL EXAM: Alert with O2 via [place] in place Verbal OP clear Neck w/o JVD or TM Heart irreg irreg Lungs clear anteriorly Abd obese and NT with nl BS Ext --moves all 4's normally  Skin warm and dry  LABS: CBC:    Component Value Date/Time   WBC 5.5 09/25/2014 0445   WBC 7.8 10/15/2013 0348   HGB 10.2* 10/01/2014 0435   HGB 9.9* 10/18/2013 0425   HCT 32.1* 09/25/2014 0445   HCT 32.0* 10/15/2013 0348   PLT 111* 09/25/2014 0445   PLT 92* 10/15/2013 0348   MCV 89.9 09/25/2014 0445   MCV 88 10/15/2013 0348   NEUTROABS 5.8 09/14/2014 0101   NEUTROABS 7.2* 10/15/2013 0348   LYMPHSABS 0.9* 09/14/2014 0101   LYMPHSABS 0.5* 10/15/2013 0348   MONOABS 0.6 09/14/2014 0101   MONOABS 0.1* 10/15/2013 0348   EOSABS 0.1 09/14/2014 0101   EOSABS 0.0 10/15/2013 0348   BASOSABS 0.0 09/14/2014 0101   BASOSABS 0.0 10/15/2013 0348   Comprehensive Metabolic Panel:    Component Value Date/Time   NA 136 09/28/2014 0515   NA 141 10/18/2013 0425   K 4.0 09/28/2014 0515   K 5.4* 10/18/2013 0425   CL 105 09/28/2014 0515   CL 101 10/18/2013 0425   CO2 27 09/28/2014 0515   CO2 37* 10/18/2013 0425   BUN 23* 09/28/2014 0515   BUN 34* 10/18/2013 0425   CREATININE 0.60 10/01/2014 0435   CREATININE 1.13 10/18/2013 0425   GLUCOSE 126* 09/28/2014 0515   GLUCOSE 96 10/18/2013 0425   CALCIUM 8.0* 09/28/2014 0515   CALCIUM 7.8* 10/18/2013 0425   AST 18 10/11/2013 0252   ALT 15 10/11/2013 0252   ALKPHOS 72 10/11/2013 0252   BILITOT 0.5 10/11/2013 0252   PROT 6.0* 10/11/2013 0252   ALBUMIN 1.5* 10/11/2013 0252    IMPRESSION: Advanced COPD with acute on chronic respiratory failure with hypercapnia and hypoxemia MRSA Pneumonia --now treated with Vanco and switched to Bactrim  Hyperkalemia (potassium 5.4 today) Chronic AFib --rate controlled and anticoagulated with Eliquis DJD Depression  with Delusional Disorder  CAD HTN DM-2 Anemia of chronic disease Thrombocytopenia --chronic with etiology unclear Constipation Severe hypoalbuminemia despite obesity  PLAN: She is to go today to a skilled facility --they have been waiting on the arrival of a BIPAP device so it will be there when she arrives. (She refers to it as CPAP).  I see that she is on 4 LPM via [place] today/ now.  She is not delusional currently.  She is able to converse and express her needs. She feels chilly, but the [facility] is quite cool in her room, so this is likely the reason (I adjusted the thermostat).  She is in agreement with the plan as it currently is in place.  She is to go to skilled care and she hopes to improve.  She feels that her mood has  improved and her depression is managed. She expresses normal speech content and says she is hoping things will get better.     I spoke with case manager about plans, arrangements, and goals of care for patient.    Time Spent: 30 minutes

## 2014-11-03 ENCOUNTER — Inpatient Hospital Stay
Admission: EM | Admit: 2014-11-03 | Discharge: 2014-11-13 | DRG: 177 | Disposition: A | Discharge status: Skilled Nursing Facility | Payer: PPO | Attending: Internal Medicine | Admitting: Internal Medicine

## 2014-11-03 ENCOUNTER — Inpatient Hospital Stay
Admit: 2014-11-03 | Discharge: 2014-11-03 | Disposition: A | Discharge status: Home / Self Care | Payer: PPO | Attending: Internal Medicine | Admitting: Internal Medicine

## 2014-11-03 ENCOUNTER — Encounter: Payer: Self-pay | Admitting: Internal Medicine

## 2014-11-03 ENCOUNTER — Emergency Department: Payer: PPO

## 2014-11-03 DIAGNOSIS — Z880 Allergy status to penicillin: Secondary | ICD-10-CM | POA: Diagnosis not present

## 2014-11-03 DIAGNOSIS — Z66 Do not resuscitate: Secondary | ICD-10-CM | POA: Diagnosis present

## 2014-11-03 DIAGNOSIS — Z7901 Long term (current) use of anticoagulants: Secondary | ICD-10-CM

## 2014-11-03 DIAGNOSIS — I4891 Unspecified atrial fibrillation: Secondary | ICD-10-CM | POA: Insufficient documentation

## 2014-11-03 DIAGNOSIS — Z79899 Other long term (current) drug therapy: Secondary | ICD-10-CM | POA: Diagnosis not present

## 2014-11-03 DIAGNOSIS — Z23 Encounter for immunization: Secondary | ICD-10-CM | POA: Diagnosis not present

## 2014-11-03 DIAGNOSIS — J441 Chronic obstructive pulmonary disease with (acute) exacerbation: Secondary | ICD-10-CM | POA: Diagnosis present

## 2014-11-03 DIAGNOSIS — I272 Other secondary pulmonary hypertension: Secondary | ICD-10-CM | POA: Diagnosis not present

## 2014-11-03 DIAGNOSIS — I5043 Acute on chronic combined systolic (congestive) and diastolic (congestive) heart failure: Secondary | ICD-10-CM | POA: Diagnosis not present

## 2014-11-03 DIAGNOSIS — I214 Non-ST elevation (NSTEMI) myocardial infarction: Secondary | ICD-10-CM | POA: Diagnosis present

## 2014-11-03 DIAGNOSIS — Z9119 Patient's noncompliance with other medical treatment and regimen: Secondary | ICD-10-CM | POA: Diagnosis present

## 2014-11-03 DIAGNOSIS — J9801 Acute bronchospasm: Secondary | ICD-10-CM | POA: Diagnosis present

## 2014-11-03 DIAGNOSIS — J9621 Acute and chronic respiratory failure with hypoxia: Secondary | ICD-10-CM | POA: Diagnosis not present

## 2014-11-03 DIAGNOSIS — J449 Chronic obstructive pulmonary disease, unspecified: Secondary | ICD-10-CM | POA: Diagnosis present

## 2014-11-03 DIAGNOSIS — Z794 Long term (current) use of insulin: Secondary | ICD-10-CM

## 2014-11-03 DIAGNOSIS — I482 Chronic atrial fibrillation: Secondary | ICD-10-CM | POA: Diagnosis present

## 2014-11-03 DIAGNOSIS — Z833 Family history of diabetes mellitus: Secondary | ICD-10-CM | POA: Diagnosis not present

## 2014-11-03 DIAGNOSIS — G4733 Obstructive sleep apnea (adult) (pediatric): Secondary | ICD-10-CM | POA: Diagnosis present

## 2014-11-03 DIAGNOSIS — Z885 Allergy status to narcotic agent status: Secondary | ICD-10-CM | POA: Diagnosis not present

## 2014-11-03 DIAGNOSIS — Z9981 Dependence on supplemental oxygen: Secondary | ICD-10-CM

## 2014-11-03 DIAGNOSIS — E662 Morbid (severe) obesity with alveolar hypoventilation: Secondary | ICD-10-CM | POA: Diagnosis present

## 2014-11-03 DIAGNOSIS — E119 Type 2 diabetes mellitus without complications: Secondary | ICD-10-CM

## 2014-11-03 DIAGNOSIS — J9622 Acute and chronic respiratory failure with hypercapnia: Secondary | ICD-10-CM | POA: Diagnosis not present

## 2014-11-03 DIAGNOSIS — D72829 Elevated white blood cell count, unspecified: Secondary | ICD-10-CM | POA: Diagnosis present

## 2014-11-03 DIAGNOSIS — I509 Heart failure, unspecified: Secondary | ICD-10-CM | POA: Diagnosis not present

## 2014-11-03 DIAGNOSIS — F329 Major depressive disorder, single episode, unspecified: Secondary | ICD-10-CM | POA: Diagnosis not present

## 2014-11-03 DIAGNOSIS — R7989 Other specified abnormal findings of blood chemistry: Secondary | ICD-10-CM | POA: Diagnosis present

## 2014-11-03 DIAGNOSIS — J9601 Acute respiratory failure with hypoxia: Secondary | ICD-10-CM

## 2014-11-03 DIAGNOSIS — I248 Other forms of acute ischemic heart disease: Secondary | ICD-10-CM | POA: Diagnosis not present

## 2014-11-03 DIAGNOSIS — M1990 Unspecified osteoarthritis, unspecified site: Secondary | ICD-10-CM | POA: Diagnosis not present

## 2014-11-03 DIAGNOSIS — Z8249 Family history of ischemic heart disease and other diseases of the circulatory system: Secondary | ICD-10-CM

## 2014-11-03 DIAGNOSIS — J810 Acute pulmonary edema: Secondary | ICD-10-CM | POA: Diagnosis not present

## 2014-11-03 DIAGNOSIS — E876 Hypokalemia: Secondary | ICD-10-CM | POA: Diagnosis present

## 2014-11-03 DIAGNOSIS — I480 Paroxysmal atrial fibrillation: Secondary | ICD-10-CM | POA: Diagnosis present

## 2014-11-03 DIAGNOSIS — D696 Thrombocytopenia, unspecified: Secondary | ICD-10-CM | POA: Diagnosis not present

## 2014-11-03 DIAGNOSIS — I10 Essential (primary) hypertension: Secondary | ICD-10-CM | POA: Diagnosis not present

## 2014-11-03 DIAGNOSIS — J9690 Respiratory failure, unspecified, unspecified whether with hypoxia or hypercapnia: Secondary | ICD-10-CM | POA: Insufficient documentation

## 2014-11-03 DIAGNOSIS — Z6841 Body Mass Index (BMI) 40.0 and over, adult: Secondary | ICD-10-CM

## 2014-11-03 DIAGNOSIS — Z9989 Dependence on other enabling machines and devices: Secondary | ICD-10-CM

## 2014-11-03 DIAGNOSIS — R778 Other specified abnormalities of plasma proteins: Secondary | ICD-10-CM | POA: Diagnosis present

## 2014-11-03 DIAGNOSIS — Z8614 Personal history of Methicillin resistant Staphylococcus aureus infection: Secondary | ICD-10-CM | POA: Diagnosis not present

## 2014-11-03 DIAGNOSIS — J189 Pneumonia, unspecified organism: Secondary | ICD-10-CM | POA: Diagnosis not present

## 2014-11-03 DIAGNOSIS — J15212 Pneumonia due to Methicillin resistant Staphylococcus aureus: Secondary | ICD-10-CM | POA: Diagnosis present

## 2014-11-03 DIAGNOSIS — J811 Chronic pulmonary edema: Secondary | ICD-10-CM | POA: Insufficient documentation

## 2014-11-03 HISTORY — DX: Major depressive disorder, single episode, unspecified: F32.9

## 2014-11-03 HISTORY — DX: Sleep apnea, unspecified: G47.30

## 2014-11-03 HISTORY — DX: Heart failure, unspecified: I50.9

## 2014-11-03 HISTORY — DX: Essential (primary) hypertension: I10

## 2014-11-03 HISTORY — DX: Type 2 diabetes mellitus without complications: E11.9

## 2014-11-03 HISTORY — DX: Depression, unspecified: F32.A

## 2014-11-03 LAB — COMPREHENSIVE METABOLIC PANEL
ALBUMIN: 2.6 g/dL — AB (ref 3.5–5.0)
ALT: 11 U/L — ABNORMAL LOW (ref 14–54)
ANION GAP: 12 (ref 5–15)
AST: 21 U/L (ref 15–41)
Alkaline Phosphatase: 128 U/L — ABNORMAL HIGH (ref 38–126)
BUN: 21 mg/dL — ABNORMAL HIGH (ref 6–20)
CHLORIDE: 92 mmol/L — AB (ref 101–111)
CO2: 34 mmol/L — AB (ref 22–32)
Calcium: 8.8 mg/dL — ABNORMAL LOW (ref 8.9–10.3)
Creatinine, Ser: 1 mg/dL (ref 0.44–1.00)
GFR calc Af Amer: >60 mL/min (ref >60)
GFR calc non Af Amer: 55 mL/min — ABNORMAL LOW (ref >60)
GLUCOSE: 173 mg/dL — AB (ref 65–99)
POTASSIUM: 2.4 mmol/L — AB (ref 3.5–5.1)
SODIUM: 138 mmol/L (ref 135–145)
TOTAL PROTEIN: 6.5 g/dL (ref 6.5–8.1)
Total Bilirubin: 0.6 mg/dL (ref 0.3–1.2)

## 2014-11-03 LAB — CBC
HCT: 39 % (ref 35.0–47.0)
Hemoglobin: 12.4 g/dL (ref 12.0–16.0)
MCH: 28.8 pg (ref 26.0–34.0)
MCHC: 31.9 g/dL — ABNORMAL LOW (ref 32.0–36.0)
MCV: 90.4 fL (ref 80.0–100.0)
Platelets: 157 10*3/uL (ref 150–440)
RBC: 4.31 MIL/uL (ref 3.80–5.20)
RDW: 15.7 % — AB (ref 11.5–14.5)
WBC: 14.2 10*3/uL — ABNORMAL HIGH (ref 3.6–11.0)

## 2014-11-03 LAB — GLUCOSE, CAPILLARY
Glucose-Capillary: 161 mg/dL — ABNORMAL HIGH (ref 65–99)
Glucose-Capillary: 148 mg/dL — ABNORMAL HIGH (ref 65–99)
Glucose-Capillary: 123 mg/dL — ABNORMAL HIGH (ref 65–99)
Glucose-Capillary: 108 mg/dL — ABNORMAL HIGH (ref 65–99)

## 2014-11-03 LAB — BRAIN NATRIURETIC PEPTIDE: B Natriuretic Peptide: 447 pg/mL — ABNORMAL HIGH (ref 0.0–100.0)

## 2014-11-03 LAB — TROPONIN I
Troponin I: 0.48 ng/mL — ABNORMAL HIGH (ref <0.031)
Troponin I: 0.04 ng/mL — ABNORMAL HIGH (ref <0.031)
Troponin I: 0.05 ng/mL — ABNORMAL HIGH (ref <0.031)

## 2014-11-03 LAB — MRSA PCR SCREENING: MRSA by PCR: NEGATIVE

## 2014-11-03 MED ORDER — DOCUSATE SODIUM 100 MG PO CAPS
200.0000 mg | ORAL_CAPSULE | Freq: Two times a day (BID) | ORAL | Status: DC
  Administered 2014-11-03 – 2014-11-13 (×21): 200 mg via ORAL
  Filled 2014-11-03 (×21): qty 2

## 2014-11-03 MED ORDER — BISACODYL 10 MG RE SUPP
10.0000 mg | Freq: Every day | RECTAL | Status: DC | PRN
  Administered 2014-11-10: 10 mg via RECTAL
  Filled 2014-11-03: qty 1

## 2014-11-03 MED ORDER — ACETAMINOPHEN 325 MG PO TABS: 650.0000 mg | ORAL_TABLET | Freq: Four times a day (QID) | ORAL | Status: DC | PRN

## 2014-11-03 MED ORDER — SERTRALINE HCL 50 MG PO TABS
100.0000 mg | ORAL_TABLET | Freq: Every day | ORAL | Status: DC
  Administered 2014-11-03 – 2014-11-13 (×11): 100 mg via ORAL
  Filled 2014-11-03: qty 1
  Filled 2014-11-03: qty 2
  Filled 2014-11-03 (×2): qty 1
  Filled 2014-11-03: qty 2
  Filled 2014-11-03: qty 1
  Filled 2014-11-03 (×3): qty 2
  Filled 2014-11-03: qty 1
  Filled 2014-11-03: qty 2

## 2014-11-03 MED ORDER — SODIUM CHLORIDE 0.9 % IV SOLN: 250.0000 mL | INTRAVENOUS | Status: DC | PRN

## 2014-11-03 MED ORDER — DILTIAZEM HCL 25 MG/5ML IV SOLN
10.0000 mg | Freq: Once | INTRAVENOUS | Status: AC
  Administered 2014-11-03: 10 mg via INTRAVENOUS
  Filled 2014-11-03: qty 5

## 2014-11-03 MED ORDER — ONDANSETRON HCL 4 MG/2ML IJ SOLN: 4.0000 mg | Freq: Four times a day (QID) | INTRAMUSCULAR | Status: DC | PRN

## 2014-11-03 MED ORDER — INSULIN ASPART 100 UNIT/ML ~~LOC~~ SOLN
0.0000 [IU] | Freq: Three times a day (TID) | SUBCUTANEOUS | Status: DC
  Administered 2014-11-03 – 2014-11-05 (×3): 2 [IU] via SUBCUTANEOUS
  Administered 2014-11-05 – 2014-11-06 (×2): 3 [IU] via SUBCUTANEOUS
  Administered 2014-11-06: 5 [IU] via SUBCUTANEOUS
  Administered 2014-11-06: 3 [IU] via SUBCUTANEOUS
  Administered 2014-11-07: 5 [IU] via SUBCUTANEOUS
  Administered 2014-11-07 – 2014-11-08 (×4): 3 [IU] via SUBCUTANEOUS
  Filled 2014-11-03: qty 3
  Filled 2014-11-03: qty 5
  Filled 2014-11-03 (×2): qty 3
  Filled 2014-11-03: qty 2
  Filled 2014-11-03 (×2): qty 3
  Filled 2014-11-03: qty 5
  Filled 2014-11-03: qty 3
  Filled 2014-11-03: qty 2
  Filled 2014-11-03: qty 3

## 2014-11-03 MED ORDER — FUROSEMIDE 10 MG/ML IJ SOLN
40.0000 mg | Freq: Once | INTRAMUSCULAR | Status: AC
  Administered 2014-11-03: 40 mg via INTRAVENOUS

## 2014-11-03 MED ORDER — INSULIN GLARGINE 100 UNIT/ML ~~LOC~~ SOLN
15.0000 [IU] | Freq: Every day | SUBCUTANEOUS | Status: DC
  Administered 2014-11-03 – 2014-11-12 (×10): 15 [IU] via SUBCUTANEOUS
  Filled 2014-11-03 (×13): qty 0.15

## 2014-11-03 MED ORDER — LEVOFLOXACIN IN D5W 500 MG/100ML IV SOLN
500.0000 mg | Freq: Once | INTRAVENOUS | Status: AC
  Administered 2014-11-03: 500 mg via INTRAVENOUS
  Filled 2014-11-03: qty 100

## 2014-11-03 MED ORDER — DILTIAZEM HCL 100 MG IV SOLR
5.0000 mg/h | INTRAVENOUS | Status: DC
  Administered 2014-11-03 (×2): 5 mg/h via INTRAVENOUS
  Filled 2014-11-03 (×2): qty 100

## 2014-11-03 MED ORDER — CETYLPYRIDINIUM CHLORIDE 0.05 % MT LIQD
7.0000 mL | Freq: Two times a day (BID) | OROMUCOSAL | Status: DC
  Administered 2014-11-03 – 2014-11-12 (×17): 7 mL via OROMUCOSAL

## 2014-11-03 MED ORDER — SODIUM CHLORIDE 0.9 % IJ SOLN
3.0000 mL | Freq: Two times a day (BID) | INTRAMUSCULAR | Status: DC
  Administered 2014-11-03 – 2014-11-13 (×19): 3 mL via INTRAVENOUS

## 2014-11-03 MED ORDER — INFLUENZA VAC SPLIT QUAD 0.5 ML IM SUSY
0.5000 mL | PREFILLED_SYRINGE | INTRAMUSCULAR | Status: AC
  Administered 2014-11-04: 0.5 mL via INTRAMUSCULAR
  Filled 2014-11-03: qty 0.5

## 2014-11-03 MED ORDER — SODIUM CHLORIDE 0.9 % IJ SOLN
3.0000 mL | Freq: Two times a day (BID) | INTRAMUSCULAR | Status: DC
  Administered 2014-11-03: 3 mL via INTRAVENOUS

## 2014-11-03 MED ORDER — CHLORHEXIDINE GLUCONATE 0.12 % MT SOLN
15.0000 mL | Freq: Two times a day (BID) | OROMUCOSAL | Status: DC
  Administered 2014-11-03 – 2014-11-13 (×20): 15 mL via OROMUCOSAL
  Filled 2014-11-03 (×22): qty 15

## 2014-11-03 MED ORDER — SODIUM CHLORIDE 0.9 % IJ SOLN
3.0000 mL | INTRAMUSCULAR | Status: DC | PRN
  Administered 2014-11-05 – 2014-11-07 (×2): 3 mL via INTRAVENOUS
  Filled 2014-11-03 (×2): qty 10

## 2014-11-03 MED ORDER — APIXABAN 5 MG PO TABS
5.0000 mg | ORAL_TABLET | Freq: Two times a day (BID) | ORAL | Status: DC
  Administered 2014-11-03 – 2014-11-13 (×21): 5 mg via ORAL
  Filled 2014-11-03 (×22): qty 1

## 2014-11-03 MED ORDER — ASPIRIN EC 81 MG PO TBEC
81.0000 mg | DELAYED_RELEASE_TABLET | Freq: Every day | ORAL | Status: DC
  Administered 2014-11-03 – 2014-11-10 (×8): 81 mg via ORAL
  Filled 2014-11-03 (×8): qty 1

## 2014-11-03 MED ORDER — FUROSEMIDE 10 MG/ML IJ SOLN
40.0000 mg | Freq: Two times a day (BID) | INTRAMUSCULAR | Status: AC
  Administered 2014-11-03 (×2): 40 mg via INTRAVENOUS
  Filled 2014-11-03 (×2): qty 4

## 2014-11-03 MED ORDER — ONDANSETRON HCL 4 MG PO TABS: 4.0000 mg | ORAL_TABLET | Freq: Four times a day (QID) | ORAL | Status: DC | PRN

## 2014-11-03 MED ORDER — TIOTROPIUM BROMIDE MONOHYDRATE 18 MCG IN CAPS
18.0000 ug | ORAL_CAPSULE | Freq: Every day | RESPIRATORY_TRACT | Status: DC
  Administered 2014-11-03 – 2014-11-13 (×11): 18 ug via RESPIRATORY_TRACT
  Filled 2014-11-03 (×3): qty 5

## 2014-11-03 MED ORDER — TRAZODONE HCL 100 MG PO TABS
100.0000 mg | ORAL_TABLET | Freq: Every day | ORAL | Status: DC
  Administered 2014-11-03 – 2014-11-12 (×10): 100 mg via ORAL
  Filled 2014-11-03 (×11): qty 1

## 2014-11-03 MED ORDER — IPRATROPIUM-ALBUTEROL 0.5-2.5 (3) MG/3ML IN SOLN
3.0000 mL | Freq: Four times a day (QID) | RESPIRATORY_TRACT | Status: DC
  Administered 2014-11-03 – 2014-11-05 (×8): 3 mL via RESPIRATORY_TRACT
  Filled 2014-11-03 (×8): qty 3

## 2014-11-03 MED ORDER — ACETAMINOPHEN 650 MG RE SUPP: 650.0000 mg | Freq: Four times a day (QID) | RECTAL | Status: DC | PRN

## 2014-11-03 MED ORDER — ACETAZOLAMIDE 250 MG PO TABS
250.0000 mg | ORAL_TABLET | Freq: Two times a day (BID) | ORAL | Status: DC
  Administered 2014-11-03 – 2014-11-11 (×16): 250 mg via ORAL
  Filled 2014-11-03 (×18): qty 1

## 2014-11-03 MED ORDER — METOPROLOL TARTRATE 25 MG PO TABS
12.5000 mg | ORAL_TABLET | Freq: Two times a day (BID) | ORAL | Status: DC
  Administered 2014-11-03 – 2014-11-04 (×3): 12.5 mg via ORAL
  Filled 2014-11-03 (×3): qty 1

## 2014-11-03 MED ORDER — POTASSIUM CHLORIDE 10 MEQ/100ML IV SOLN
10.0000 meq | INTRAVENOUS | Status: AC
  Administered 2014-11-03 (×5): 10 meq via INTRAVENOUS
  Filled 2014-11-03 (×5): qty 100

## 2014-11-03 MED ORDER — NYSTATIN 100000 UNIT/GM EX POWD
Freq: Two times a day (BID) | CUTANEOUS | Status: DC
  Administered 2014-11-03 – 2014-11-04 (×3): via TOPICAL
  Filled 2014-11-03: qty 15

## 2014-11-03 MED ORDER — LEVOFLOXACIN IN D5W 750 MG/150ML IV SOLN
750.0000 mg | INTRAVENOUS | Status: DC
  Administered 2014-11-04 – 2014-11-05 (×2): 750 mg via INTRAVENOUS
  Filled 2014-11-03 (×3): qty 150

## 2014-11-03 MED ORDER — THEOPHYLLINE ER 200 MG PO TB12
200.0000 mg | ORAL_TABLET | Freq: Two times a day (BID) | ORAL | Status: DC
  Administered 2014-11-03 – 2014-11-13 (×21): 200 mg via ORAL
  Filled 2014-11-03 (×4): qty 2
  Filled 2014-11-03 (×5): qty 1
  Filled 2014-11-03: qty 2
  Filled 2014-11-03: qty 1
  Filled 2014-11-03 (×2): qty 2
  Filled 2014-11-03: qty 1
  Filled 2014-11-03 (×2): qty 2
  Filled 2014-11-03: qty 1
  Filled 2014-11-03 (×2): qty 2
  Filled 2014-11-03 (×2): qty 1
  Filled 2014-11-03: qty 2
  Filled 2014-11-03: qty 1
  Filled 2014-11-03 (×3): qty 2

## 2014-11-03 MED ORDER — LEVOFLOXACIN IN D5W 250 MG/50ML IV SOLN
250.0000 mg | Freq: Once | INTRAVENOUS | Status: AC
  Administered 2014-11-03: 250 mg via INTRAVENOUS
  Filled 2014-11-03: qty 50

## 2014-11-03 NOTE — Progress Notes (Signed)
[facility] [place] at [facility]   PATIENT NAME: Eileen [name]    MR#:  [id number]  DATE OF BIRTH:  01-24-1946  SUBJECTIVE:  Very lethargic. On bipap. Came in with hypoxic respiratory failure. No fever  REVIEW OF SYSTEMS:   Review of Systems  Unable to perform ROS: medical condition   Tolerating Diet:no Tolerating PT: no  DRUG ALLERGIES:   Allergies  Allergen Reactions  . Other Other (See Comments)    darvocet--pt reports blistering with this medication  . Penicillins Nausea And Vomiting  . Darvon [Propoxyphene] Other (See Comments)    Blisters    VITALS:  Blood pressure 127/98, pulse 116, temperature 98.3 F (36.8 C), temperature source Axillary, resp. rate 41, height 4\' 11"  (1.499 m), weight 149.6 kg (329 lb 12.9 oz), SpO2 98 %.  PHYSICAL EXAMINATION:   Physical Exam Constitutional: She is oriented to person, place, and time. She appears well-developed and well-nourished.  In moderate respiratory distress +, on BiPAP  HENT:  Head: Normocephalic and atraumatic.  Right Ear: External ear normal.  Left Ear: External ear normal.  Mouth/Throat: No oropharyngeal exudate.  Eyes: EOM are normal. Pupils are equal, round, and reactive to light. No scleral icterus.  Neck: Normal range of motion. Neck supple. No JVD present. No thyromegaly present.  Cardiovascular: Normal rate, normal heart sounds and intact distal pulses. Exam reveals no friction rub.  No murmur heard. Irregularly irregular  Respiratory: She is in respiratory distress. She has no wheezes. She has rales. On BIPAP GI: Soft. Bowel sounds are normal. She exhibits no distension and no mass. There is no tenderness. There is no rebound and no guarding.  Musculoskeletal: Normal range of motion. She exhibits no edema.  Lymphadenopathy:   She has no cervical adenopathy.  Neurological: unable to assess being on BIPAP Skin: Skin is warm. No rash noted. No erythema.  Psychiatric:  lethargicl.   LABORATORY PANEL:   CBC  Recent Labs Lab 11/03/14 0352  WBC 14.2*  HGB 12.4  HCT 39.0  PLT 157    Chemistries   Recent Labs Lab 11/03/14 0352  NA 138  K 2.4*  CL 92*  CO2 34*  GLUCOSE 173*  BUN 21*  CREATININE 1.00  CALCIUM 8.8*  AST 21  ALT 11*  ALKPHOS 128*  BILITOT 0.6    Cardiac Enzymes  Recent Labs Lab 11/03/14 1503  TROPONINI 0.05*    RADIOLOGY:  Dg Chest Portable 1 View  11/03/2014   CLINICAL DATA:  Respiratory distress.  Difficulty breathing.  EXAM: PORTABLE CHEST - 1 VIEW  COMPARISON:  09/18/2014  FINDINGS: Lung volumes are low. Unchanged cardiomegaly. Diffuse bilateral patchy airspace opacity, detailed evaluation limited by soft tissue attenuation from body habitus. No large pleural effusion or pneumothorax. No acute osseous abnormalities are seen.  IMPRESSION: Hypoventilatory chest with cardiomegaly. Diffuse bilateral patchy airspace opacities, may reflect pulmonary edema, infection or inflammatory process.   Electronically Signed   By: Rubye Oaks M.D.   On: 11/03/2014 04:16     ASSESSMENT AND PLAN:   69 year old female with history of multiple medical problems including obstructive sleep apnea on CPAP, noncompliance to CPAP use, hypertension, diastolic congestive heart failure, atrial fibrillation on Eliquis, COPD, diabetes mellitus type 2, depression, on DO NOT RESUSCITATE status brought in with complains of acute respiratory distress with hypoxia.  1. Acute and chronic respiratory failure with hypoxia secondary to acute pulmonary edema. Underlying pneumonia could not be ruled out. -BIPAP -IV Levaquin -Pulmonary consult -f/u  BC -if remains afebrile, and neg BC than consider d/c abxs  2. Acute pulmonary edema, history of chronic gastric congestive heart failure, recent echo of August 2015 ejection fraction 50-50%. -IV lasix bid, I an O, monitor creat  3. Leukocytosis with bilateral patchy airspace opacities. Possible  pneumonia.  4. Elevated troponin-demand ischemia versus non-STEMI. Likely demend ischemia  5. Atrial fibrillation with rapid ventricle response, history of chronic atrial fibrillation on Eliquis. - continue BiPAP, IV furosemide, Nitropaste, cycle cardiac enzymes, low dose beta blocker.   6. Hypokalemia-supplement potassium, follow-up BMP.  7. Obstructive sleep apnea, continue BiPAP.  8. COPD, stable. Continue home medications.  9. Diabetes mellitus type 2 on Lantus. Stable. Continue same plus SSI.  10. Hypertension, stable. Case discussed with Care Management/Social Worker. Management plans discussed with the patient, family and they are in agreement.  CODE STATUS: DNR  DVT Prophylaxis: eliquis  TOTAL criticalTIME TAKING CARE OF THIS PATIENT: 71 minutes.  >50% time spent on counselling and coordination of care Dr Jarome Lamas M.D on 11/03/2014 at 4:43 PM  Between 7am to 6pm - Pager - [phone]  After 6pm go to [email or web address] - password [email or web address] [facility]  [facility]  Office  [phone]  CC: Primary care physician; Laurier Nancy, MD

## 2014-11-03 NOTE — Progress Notes (Signed)
ANTIBIOTIC CONSULT NOTE - INITIAL  Pharmacy Consult for Levaquin Indication: HCAP  Allergies  Allergen Reactions  . Other Other (See Comments)    darvocet--pt reports blistering with this medication  . Penicillins Nausea And Vomiting  . Darvon [Propoxyphene] Other (See Comments)    Blisters    Patient Measurements: Height:  (149.9 cm) Weight: (!) 329 lb 12.9 oz (149.6 kg) IBW/kg (Calculated) : 43.2 Adjusted Body Weight: 87 kg  Vital Signs: Temp: 98.3 F (36.8 C) (08/27 0900) Temp Source: Axillary (08/27 0900) BP: 130/74 mmHg (08/27 0900) Pulse Rate: 84 (08/27 0900) Intake/Output from previous day:   Intake/Output from this shift:    Labs:  Recent Labs  11/03/14 0352  WBC 14.2*  HGB 12.4  PLT 157  CREATININE 1.00   Estimated Creatinine Clearance: 69.9 mL/min (by C-G formula based on Cr of 1). No results for input(s): VANCOTROUGH, VANCOPEAK, VANCORANDOM, GENTTROUGH, GENTPEAK, GENTRANDOM, TOBRATROUGH, TOBRAPEAK, TOBRARND, AMIKACINPEAK, AMIKACINTROU, AMIKACIN in the last 72 hours.   Microbiology: No results found for this or any previous visit (from the past 720 hour(s)).  Medical History: Past Medical History  Diagnosis Date  . COPD (chronic obstructive pulmonary disease)   . Arthritis   . Hypertension   . Diabetes mellitus without complication   . CHF (congestive heart failure)   . Sleep apnea   . Depression     Medications:  Anti-infectives    Start     Dose/Rate Route Frequency Ordered Stop   11/04/14 1000  levofloxacin (LEVAQUIN) IVPB 750 mg     750 mg 100 mL/hr over 90 Minutes Intravenous Every 24 hours 11/03/14 0952     11/03/14 1000  Levofloxacin (LEVAQUIN) IVPB 250 mg     250 mg 50 mL/hr over 60 Minutes Intravenous  Once 11/03/14 0952     11/03/14 0430  levofloxacin (LEVAQUIN) IVPB 500 mg     500 mg 100 mL/hr over 60 Minutes Intravenous  Once 11/03/14 0426 11/03/14 0559     Assessment: Patient is admitted for possible HCAP. Received  Levaquin  IV in the ED this morning. MD orders for pharmacist dosing of Levaquin.  Goal of Therapy:  Resolution of symptoms  Plan:  Follow up culture results Will order Levaquin  IV q24h. Will order additional  IV dose today for a total dose of .  Clovia Cuff, PharmD, BCPS 11/03/2014 9:57 AM

## 2014-11-03 NOTE — Progress Notes (Signed)
*  PRELIMINARY RESULTS* Echocardiogram 2D Echocardiogram has been performed.  Eileen [name] 11/03/2014, 1:09 PM

## 2014-11-03 NOTE — ED Notes (Signed)
MD made aware of pt's critical labs values for K+ (2.4) and Troponin (0.48). Orders received for serial IVPB's of K+.

## 2014-11-03 NOTE — Progress Notes (Signed)
Patient alert and oriented. Vital stable- afib and controlled with cardizem drip.  Pt on Bipap- 70%- sating 100%- Patient having no complaints of pain- no shortness of breath at rest. Patient resting at this time.

## 2014-11-03 NOTE — ED Notes (Signed)
Attempted to call pt's husband to notify him of pt's arrival to the ED per pt's request. Pt made aware that there was no answer at the phone number given for home phone.

## 2014-11-03 NOTE — Consult Note (Signed)
Primary Cardiologist: None    Reason for Consultation : Pulmonary edema vs PNA, a-fib, elevated troponin   HPI : This is a 69 yo F with PMHx COPD, OSA (non-compliant with CPAP), chronic a-fib, diastolic CHF, HTN, DM2, who presents from [facility] c/o worsening SOB. o2 sats found to be in mid 70s and 80s, thus pt was admitted and cardiology was consulted for pulmonary edema vs PNA. Had a-fib with RVR, VR 164, now in 90s. States breathing has improved, denies CP, pressure, or tightness.         Review of Systems: General: negative for chills, fever, night sweats or weight changes.  Cardiovascular: negative for chest pain, edema, orthopnea, palpitations, paroxysmal nocturnal dyspnea. Positive for SOB  Dermatological: negative for rash Respiratory: negative for cough or wheezing Urologic: negative for hematuria Abdominal: negative for nausea, vomiting, diarrhea, bright red blood per rectum, melena, or hematemesis Neurologic: negative for visual changes, syncope, or dizziness All other systems reviewed and are otherwise negative except as noted above.    Past Medical History  Diagnosis Date  . COPD (chronic obstructive pulmonary disease)   . Arthritis   . Hypertension   . Diabetes mellitus without complication   . CHF (congestive heart failure)   . Sleep apnea   . Depression     Medications Prior to Admission  Medication Sig Dispense Refill  . acetaZOLAMIDE (DIAMOX) 250 MG tablet Take 1 tablet (250 mg total) by mouth 2 (two) times daily.    Marland Kitchen albuterol (PROVENTIL) (2.5 MG/3ML) 0.083% nebulizer solution Take 3 mLs (2.5 mg total) by nebulization every 6 (six) hours. 75 mL 12  . apixaban (ELIQUIS) 5 MG TABS tablet Take 5 mg by mouth 2 (two) times daily.    Marland Kitchen docusate sodium (COLACE) 100 MG capsule Take 200 mg by mouth 2 (two) times daily.    . furosemide (LASIX) 40 MG tablet Take 40 mg by mouth daily.    . insulin aspart (NOVOLOG) 100 UNIT/ML injection Inject 0-20 Units into  the skin 3 (three) times daily with meals. 10 mL 11  . insulin glargine (LANTUS) 100 UNIT/ML injection Inject 0.15 mLs (15 Units total) into the skin at bedtime. 10 mL 11  . sertraline (ZOLOFT) 50 MG tablet Take 100 mg by mouth daily.    . theophylline (THEODUR) 200 MG 12 hr tablet Take 1 tablet (200 mg total) by mouth every 12 (twelve) hours.    Marland Kitchen tiotropium (SPIRIVA) 18 MCG inhalation capsule Place 1 capsule (18 mcg total) into inhaler and inhale daily. 30 capsule 12  . traZODone (DESYREL) 100 MG tablet Take 100 mg by mouth at bedtime.    . bisacodyl (DULCOLAX) 10 MG suppository Place 1 suppository (10 mg total) rectally daily as needed for moderate constipation. 12 suppository 0  . insulin aspart (NOVOLOG) 100 UNIT/ML injection Inject 0-5 Units into the skin at bedtime. 10 mL 11     . acetaZOLAMIDE  250 mg Oral BID  . antiseptic oral rinse  7 mL Mouth Rinse q12n4p  . apixaban  5 mg Oral BID  . aspirin EC  81 mg Oral Daily  . chlorhexidine  15 mL Mouth Rinse BID  . docusate sodium  200 mg Oral BID  . furosemide  40 mg Intravenous BID  . [START ON 11/04/2014] Influenza vac split quadrivalent PF  0.5 mL Intramuscular Tomorrow-1000  . insulin aspart  0-15 Units Subcutaneous TID WC  . insulin glargine  15 Units Subcutaneous QHS  . ipratropium-albuterol  3 mL Nebulization Q6H  . levofloxacin (LEVAQUIN) IV  250 mg Intravenous Once  . [START ON 11/04/2014] levofloxacin (LEVAQUIN) IV  750 mg Intravenous Q24H  . metoprolol tartrate  12.5 mg Oral BID  . sertraline  100 mg Oral Daily  . sodium chloride  3 mL Intravenous Q12H  . sodium chloride  3 mL Intravenous Q12H  . theophylline  200 mg Oral Q12H  . tiotropium  18 mcg Inhalation Daily  . traZODone  100 mg Oral QHS    Infusions: . diltiazem (CARDIZEM) infusion 5 mg/hr (11/03/14 5366)    Allergies  Allergen Reactions  . Other Other (See Comments)    darvocet--pt reports blistering with this medication  . Penicillins Nausea And Vomiting   . Darvon [Propoxyphene] Other (See Comments)    Blisters    Social History   Social History  . Marital Status: Married    Spouse Name: N/A  . Number of Children: N/A  . Years of Education: N/A   Occupational History  . Not on file.   Social History Main Topics  . Smoking status: Never Smoker   . Smokeless tobacco: Never Used  . Alcohol Use: No  . Drug Use: No  . Sexual Activity: Not on file   Other Topics Concern  . Not on file   Social History Narrative    Family History  Problem Relation Age of Onset  . CAD Mother   . Diabetes type II Mother   . Hypertension Father   . CAD Father     PHYSICAL EXAM: Filed Vitals:   11/03/14 1200  BP:   Pulse: 96  Temp:   Resp: 18     Intake/Output Summary (Last 24 hours) at 11/03/14 1337 Last data filed at 11/03/14 1005  Gross per 24 hour  Intake    205 ml  Output    425 ml  Net   -220 ml    General:  Well appearing. No respiratory difficulty HEENT: normal Neck: supple. no JVD. Carotids 2+ bilat; no bruits. No lymphadenopathy or thryomegaly appreciated. Cor: PMI nondisplaced. Regular rate & rhythm. No rubs, gallops or murmurs. Lungs: clear Abdomen: soft, nontender, nondistended. No hepatosplenomegaly. No bruits or masses. Good bowel sounds. Extremities: no cyanosis, clubbing, rash, edema Neuro: alert & oriented x 3, cranial nerves grossly intact. moves all 4 extremities w/o difficulty. Affect pleasant.  ECG: a-fib with RVR 164 VR PRWP. Tele now showing a-fib with 91 VR  Results for orders placed or performed during the hospital encounter of 11/03/14 (from the past 24 hour(s))  CBC     Status: Abnormal   Collection Time: 11/03/14  3:52 AM  Result Value Ref Range   WBC 14.2 (H) 3.6 - 11.0 K/uL   RBC 4.31 3.80 - 5.20 MIL/uL   Hemoglobin 12.4 12.0 - 16.0 g/dL   HCT 44.0 34.7 - 42.5 %   MCV 90.4 80.0 - 100.0 fL   MCH 28.8 26.0 - 34.0 pg   MCHC 31.9 (L) 32.0 - 36.0 g/dL   RDW 95.6 (H) 38.7 - 56.4 %   Platelets  157 150 - 440 K/uL  Comprehensive metabolic panel     Status: Abnormal   Collection Time: 11/03/14  3:52 AM  Result Value Ref Range   Sodium 138 135 - 145 mmol/L   Potassium 2.4 (LL) 3.5 - 5.1 mmol/L   Chloride 92 (L) 101 - 111 mmol/L   CO2 34 (H) 22 - 32 mmol/L   Glucose, Bld 173 (H)  65 - 99 mg/dL   BUN 21 (H) 6 - 20 mg/dL   Creatinine, Ser 1.61 0.44 - 1.00 mg/dL   Calcium 8.8 (L) 8.9 - 10.3 mg/dL   Total Protein 6.5 6.5 - 8.1 g/dL   Albumin 2.6 (L) 3.5 - 5.0 g/dL   AST 21 15 - 41 U/L   ALT 11 (L) 14 - 54 U/L   Alkaline Phosphatase 128 (H) 38 - 126 U/L   Total Bilirubin 0.6 0.3 - 1.2 mg/dL   GFR calc non Af Amer 55 (L) >60 mL/min   GFR calc Af Amer >60 >60 mL/min   Anion gap 12 5 - 15  Troponin I     Status: Abnormal   Collection Time: 11/03/14  3:52 AM  Result Value Ref Range   Troponin I 0.48 (H) <0.031 ng/mL  Brain natriuretic peptide     Status: Abnormal   Collection Time: 11/03/14  3:52 AM  Result Value Ref Range   B Natriuretic Peptide 447.0 (H) 0.0 - 100.0 pg/mL  Glucose, capillary     Status: Abnormal   Collection Time: 11/03/14  9:02 AM  Result Value Ref Range   Glucose-Capillary 161 (H) 65 - 99 mg/dL  Troponin I     Status: Abnormal   Collection Time: 11/03/14  9:15 AM  Result Value Ref Range   Troponin I 0.04 (H) <0.031 ng/mL  MRSA PCR Screening     Status: None   Collection Time: 11/03/14  9:41 AM  Result Value Ref Range   MRSA by PCR NEGATIVE NEGATIVE   Dg Chest Portable 1 View  11/03/2014   CLINICAL DATA:  Respiratory distress.  Difficulty breathing.  EXAM: PORTABLE CHEST - 1 VIEW  COMPARISON:  09/18/2014  FINDINGS: Lung volumes are low. Unchanged cardiomegaly. Diffuse bilateral patchy airspace opacity, detailed evaluation limited by soft tissue attenuation from body habitus. No large pleural effusion or pneumothorax. No acute osseous abnormalities are seen.  IMPRESSION: Hypoventilatory chest with cardiomegaly. Diffuse bilateral patchy airspace opacities, may  reflect pulmonary edema, infection or inflammatory process.   Electronically Signed   By: Rubye Oaks M.D.   On: 11/03/2014 04:16     ASSESSMENT: Pulmonary edema, a-fib, mildly elevated troponin likely 2/2 demand ischemia   PLAN/DISCUSSION: Agree with plan, advise continuation of IV lasix, metoprolol, cardizem, eliquis, and abx. Pt is non-compliant with CPAP, thus may convert with amio, but will convert back to a-fib once again non-compliant. Advise conservative treatment. Await echo results for any further recs.   Patient and plan discussed with supervising provider, Dr. Adrian Blackwater, who agrees with above findings.   Alinda Sierras Margarito Courser [facility] 11/03/2014 1:37 PM

## 2014-11-03 NOTE — H&P (Signed)
[facility] - [place] at [facility]   PATIENT NAME: Eileen [name]    MR#:  [id number]  DATE OF BIRTH:  09/04/1945  DATE OF ADMISSION:  11/03/2014  PRIMARY CARE PHYSICIAN: Pcp Not In System   REQUESTING/REFERRING PHYSICIAN: Dr. Manson Passey  CHIEF COMPLAINT:   Chief Complaint  Patient presents with  . Respiratory Distress    HISTORY OF PRESENT ILLNESS:  Eileen [name]  is a 69 y.o. female with a known history of obstructive sleep apnea on CPAP with noncompliance to CPAP usage, hypertension, diastolic congestive heart failure, COPD on home oxygen, atrial fibrillation on Eliquis, diabetes mellitus type 2, on DO NOT RESUSCITATE status was found to be in respiratory distress with hypoxia at [facility] with O2 saturations in the mid 70s to low 80s, brought into the emergency room for further evaluation. On arrival patient was found to be in respiratory distress with hypoxia, was placed on BiPAP. Chest x-ray revealed diffuse bilateral patchy airspace opacities suggestive of acute pulmonary edema. Patient received Nitropaste, IV furosemide. EKG showed atrial fibrillation with rapid ventricular rate of 164 bpm. Lab work revealed potassium of 2.4, troponin 0.48, WBC 14.2. Patient was also given IV Levaquin in view of elevated WBC and bilateral airspace opacities for possible pneumonia. Patient received IV Cardizem 10 mg 1. Hospitalist service was consulted for further management. Patient is currently on BiPAP mask and O2 saturations are running in the upper 90s. Patient is alert awake and oriented, following commands, states doing slightly better. Patient denies any history of chest pain, fever, cough, nausea, vomiting, diarrhea, abdominal pain.   PAST MEDICAL HISTORY:   Past Medical History  Diagnosis Date  . COPD (chronic obstructive pulmonary disease)   . Arthritis   . Hypertension   . Diabetes mellitus without complication   . CHF (congestive heart failure)   .  Sleep apnea   . Depression     PAST SURGICAL HISTORY:   Past Surgical History  Procedure Laterality Date  . Laminectomy    . Back surgery      SOCIAL HISTORY:   Social History  Substance Use Topics  . Smoking status: Never Smoker   . Smokeless tobacco: Never Used  . Alcohol Use: No    FAMILY HISTORY:   Family History  Problem Relation Age of Onset  . CAD Mother   . Diabetes type II Mother   . Hypertension Father   . CAD Father     DRUG ALLERGIES:   Allergies  Allergen Reactions  . Other Other (See Comments)    darvocet--pt reports blistering with this medication  . Penicillins Nausea And Vomiting  . Darvon [Propoxyphene] Other (See Comments)    Blisters    REVIEW OF SYSTEMS:   Review of Systems  Constitutional: Negative for fever, chills and malaise/fatigue.  HENT: Negative for ear pain, hearing loss, nosebleeds, sore throat and tinnitus.   Eyes: Negative for blurred vision, double vision, pain, discharge and redness.  Respiratory: Positive for shortness of breath. Negative for cough, hemoptysis, sputum production and wheezing.   Cardiovascular: Negative for chest pain, palpitations, orthopnea and leg swelling.  Gastrointestinal: Negative for nausea, vomiting, abdominal pain, diarrhea, constipation, blood in stool and melena.  Genitourinary: Negative for dysuria, urgency, frequency and hematuria.  Musculoskeletal: Negative for back pain, joint pain and neck pain.  Skin: Negative for itching and rash.  Neurological: Negative for dizziness, tingling, sensory change, focal weakness and seizures.  Endo/Heme/Allergies: Does not bruise/bleed easily.  Psychiatric/Behavioral: Positive for depression.  The patient is not nervous/anxious.     MEDICATIONS AT HOME:   Prior to Admission medications   Medication Sig Start Date End Date Taking? Authorizing Provider  acetaZOLAMIDE (DIAMOX) 250 MG tablet Take 1 tablet (250 mg total) by mouth 2 (two) times daily. 10/02/14   Yes Houston Siren, MD  albuterol (PROVENTIL) (2.5 MG/3ML) 0.083% nebulizer solution Take 3 mLs (2.5 mg total) by nebulization every 6 (six) hours. 10/02/14  Yes Houston Siren, MD  apixaban (ELIQUIS) 5 MG TABS tablet Take 5 mg by mouth 2 (two) times daily.   Yes Historical Provider, MD  docusate sodium (COLACE) 100 MG capsule Take 200 mg by mouth 2 (two) times daily.   Yes Historical Provider, MD  furosemide (LASIX) 40 MG tablet Take 40 mg by mouth daily.   Yes Historical Provider, MD  insulin aspart (NOVOLOG) 100 UNIT/ML injection Inject 0-20 Units into the skin 3 (three) times daily with meals. 10/02/14  Yes Houston Siren, MD  insulin glargine (LANTUS) 100 UNIT/ML injection Inject 0.15 mLs (15 Units total) into the skin at bedtime. 10/02/14  Yes Houston Siren, MD  sertraline (ZOLOFT) 50 MG tablet Take 100 mg by mouth daily.   Yes Historical Provider, MD  theophylline (THEODUR) 200 MG 12 hr tablet Take 1 tablet (200 mg total) by mouth every 12 (twelve) hours. 10/02/14  Yes Houston Siren, MD  tiotropium (SPIRIVA) 18 MCG inhalation capsule Place 1 capsule (18 mcg total) into inhaler and inhale daily. 10/02/14  Yes Houston Siren, MD  traZODone (DESYREL) 100 MG tablet Take 100 mg by mouth at bedtime.   Yes Historical Provider, MD  bisacodyl (DULCOLAX) 10 MG suppository Place 1 suppository (10 mg total) rectally daily as needed for moderate constipation. 10/02/14   Houston Siren, MD  insulin aspart (NOVOLOG) 100 UNIT/ML injection Inject 0-5 Units into the skin at bedtime. 10/02/14   Houston Siren, MD      VITAL SIGNS:  Blood pressure 139/94, pulse 68, resp. rate 8, height  (1.499 m), weight 188.243 kg (415 lb), SpO2 99 %.  PHYSICAL EXAMINATION:  Physical Exam  Constitutional: She is oriented to person, place, and time. She appears well-developed and well-nourished.  In moderate  respiratory distress +, on BiPAP  HENT:  Head: Normocephalic and atraumatic.  Right Ear: External ear  normal.  Left Ear: External ear normal.  Mouth/Throat: No oropharyngeal exudate.  Eyes: EOM are normal. Pupils are equal, round, and reactive to light. No scleral icterus.  Neck: Normal range of motion. Neck supple. No JVD present. No thyromegaly present.  Cardiovascular: Normal rate, normal heart sounds and intact distal pulses.  Exam reveals no friction rub.   No murmur heard. Irregularly irregular  Respiratory: She is in respiratory distress. She has no wheezes. She has rales. She exhibits no tenderness.  GI: Soft. Bowel sounds are normal. She exhibits no distension and no mass. There is no tenderness. There is no rebound and no guarding.  Musculoskeletal: Normal range of motion. She exhibits no edema.  Lymphadenopathy:    She has no cervical adenopathy.  Neurological: She is alert and oriented to person, place, and time. She has normal reflexes. She displays normal reflexes. No cranial nerve deficit. She exhibits normal muscle tone.  Skin: Skin is warm. No rash noted. No erythema.  Psychiatric: She has a normal mood and affect. Her behavior is normal.   LABORATORY PANEL:   CBC  Recent Labs Lab 11/03/14 0352  WBC  14.2*  HGB 12.4  HCT 39.0  PLT 157   ------------------------------------------------------------------------------------------------------------------  Chemistries   Recent Labs Lab 11/03/14 0352  NA 138  K 2.4*  CL 92*  CO2 34*  GLUCOSE 173*  BUN 21*  CREATININE 1.00  CALCIUM 8.8*  AST 21  ALT 11*  ALKPHOS 128*  BILITOT 0.6   ------------------------------------------------------------------------------------------------------------------  Cardiac Enzymes  Recent Labs Lab 11/03/14 0352  TROPONINI 0.48*   ------------------------------------------------------------------------------------------------------------------  RADIOLOGY:  Dg Chest Portable 1 View  11/03/2014   CLINICAL DATA:  Respiratory distress.  Difficulty breathing.  EXAM:  PORTABLE CHEST - 1 VIEW  COMPARISON:  09/18/2014  FINDINGS: Lung volumes are low. Unchanged cardiomegaly. Diffuse bilateral patchy airspace opacity, detailed evaluation limited by soft tissue attenuation from body habitus. No large pleural effusion or pneumothorax. No acute osseous abnormalities are seen.  IMPRESSION: Hypoventilatory chest with cardiomegaly. Diffuse bilateral patchy airspace opacities, may reflect pulmonary edema, infection or inflammatory process.   Electronically Signed   By: Rubye Oaks M.D.   On: 11/03/2014 04:16    EKG:   Orders placed or performed during the hospital encounter of 11/03/14  . EKG 12-Lead  . EKG 12-Lead  Atrial fibrillation with ventricular rate of 1 64 bpm, left axis deviation.   IMPRESSION AND PLAN:   69 year old female with history of multiple medical problems including obstructive sleep apnea on CPAP, noncompliance to CPAP use, hypertension, diastolic congestive heart failure, atrial fibrillation on Eliquis, COPD, diabetes mellitus type 2, depression, on DO NOT RESUSCITATE status brought in with complains of acute respiratory distress with hypoxia. 1. Acute and chronic respiratory failure with hypoxia secondary to acute pulmonary edema. Underlying pneumonia could not be ruled out. 2. Acute pulmonary edema, history of chronic gastric congestive heart failure, recent echo of August 2015 ejection fraction 50-50%. 3. Leukocytosis with bilateral patchy airspace opacities. Possible pneumonia. 4. Elevated troponin-demand ischemia versus non-STEMI. 5. Atrial fibrillation with rapid ventricle response, history of chronic atrial fibrillation on Eliquis. Plan: Admit to ICU, continue BiPAP, IV furosemide, Nitropaste, cycle cardiac enzymes, low dose beta blocker. Order echocardiogram and cardiac consultation. Continue Eliquis. 6. Hypokalemia-supplement potassium, follow-up BMP. 7. Obstructive sleep apnea, continue BiPAP. 8. COPD, stable. Continue home  medications. 9. Diabetes mellitus type 2 on Lantus. Stable. Continue same plus SSI. 10. Hypertension, stable.    All the records are reviewed and case discussed with ED provider. Management plans discussed with the patient, family and they are in agreement.  CODE STATUS: DO NOT RESUSCITATE   TOTAL TIME TAKING CARE OF THIS PATIENT: 50  minutes.    Jonnie Kind N M.D on 11/03/2014 at 6:06 AM  Between 7am to 6pm - Pager - [phone]  After 6pm go to [email or web address] - password [email or web address]  [facility]  Office  [phone]  CC: Primary care physician; Pcp Not In System

## 2014-11-03 NOTE — Evaluation (Signed)
Physical Therapy Evaluation Patient Details Name: Eileen [name] MRN: [id number] DOB: 12-Mar-1945 Today's Date: 11/03/2014   History of Present Illness  SOB and COPD exacerbation, now very weak  Clinical Impression  Pt is on 70% BiPap and has elevated HR t/o PT exam and subsequent 9 minutes of therex.  She is able to show some strength with ankles and hips but b/l knees are very painful and limited.  She states since she fell on her knees 6 months ago she has essentially been bed bound.      Follow Up Recommendations SNF    Equipment Recommendations       Recommendations for Other Services       Precautions / Restrictions Restrictions Weight Bearing Restrictions: No      Mobility  Bed Mobility Overal bed mobility:  (pt reports that she would not be able to try sitting today)                Transfers                    Ambulation/Gait                Stairs            Wheelchair Mobility    Modified Rankin (Stroke Patients Only)       Balance                                             Pertinent Vitals/Pain Pain Assessment:  (severe b/l knee pain with ROM)    Home Living Family/patient expects to be discharged to:: Skilled nursing facility                      Prior Function Level of Independence: Needs assistance         Comments: Pt reports she hasn't walked in 6 months and is in bed almost all the time     Hand Dominance        Extremity/Trunk Assessment   Upper Extremity Assessment: Generalized weakness           Lower Extremity Assessment: Generalized weakness (pt has considerable weakness, functional limitations )         Communication   Communication: No difficulties (wearing mask)  Cognition Arousal/Alertness: Awake/alert Behavior During Therapy: Restless Overall Cognitive Status: Within Functional Limits for tasks assessed                      General Comments       Exercises General Exercises - Lower Extremity Ankle Circles/Pumps: AROM;Strengthening;10 reps Quad Sets: AROM;10 reps Heel Slides: PROM;5 reps Hip ABduction/ADduction: AROM;10 reps      Assessment/Plan    PT Assessment Patient needs continued PT services  PT Diagnosis Difficulty walking;Generalized weakness   PT Problem List Decreased strength;Decreased activity tolerance;Decreased balance;Decreased safety awareness  PT Treatment Interventions Functional mobility training;Therapeutic activities;Therapeutic exercise;Balance training;Wheelchair mobility training   PT Goals (Current goals can be found in the Care Plan section) Acute Rehab PT Goals Patient Stated Goal: states she'd like to return to some mobility even if it's wheel chair related PT Goal Formulation: With patient Time For Goal Achievement: 11/17/14 Potential to Achieve Goals: Good    Frequency Min 2X/week   Barriers to discharge        Co-evaluation  End of Session   Activity Tolerance: Patient limited by fatigue Patient left: with bed alarm set           Time: 1208-1234 PT Time Calculation (min) (ACUTE ONLY): 26 min   Charges:   PT Evaluation $Initial PT Evaluation Tier I: 1 Procedure PT Treatments $Therapeutic Exercise: 8-22 mins   PT G Codes:       Loran Senters, PT, DPT [phone]  Malachi Pro 11/03/2014, 2:10 PM

## 2014-11-03 NOTE — ED Provider Notes (Signed)
[facility] Emergency Department Provider Note  ____________________________________________  Time seen: 3:40 AM  I have reviewed the triage vital signs and the nursing notes.  History of physical exam limited by rest or distress  HISTORY  Chief Complaint Respiratory Distress     HPI Eileen [name] is a 69 y.o. female presents via EMS in rest or distress. Per EMS on their arrival to [facility] patient's O2 sat was 50% per EMS patient is supposed to wear a CPAP but she has been noncompliant and refusing to do so. CPAP was applied by EMS with oxygen level improved to 80%. Patient states she's had progressive dyspnea over the course of the day. Of note patient has a history of congestive heart failure for which she takes Lasix 40 mg once a day. Patient admits to "chest tightness at this time.    Past Medical History  Diagnosis Date  . COPD (chronic obstructive pulmonary disease)   . Arthritis     Patient Active Problem List   Diagnosis Date Noted  . Acute delirium 09/17/2014  . Acute on chronic respiratory failure with hypoxia 09/14/2014  . COPD exacerbation 09/14/2014  . Pressure ulcer 09/14/2014    Past Surgical History  Procedure Laterality Date  . Laminectomy    . Back surgery      Current Outpatient Rx  Name  Route  Sig  Dispense  Refill  . acetaZOLAMIDE (DIAMOX) 250 MG tablet   Oral   Take 1 tablet (250 mg total) by mouth 2 (two) times daily.         Marland Kitchen albuterol (PROVENTIL) (2.5 MG/3ML) 0.083% nebulizer solution   Nebulization   Take 3 mLs (2.5 mg total) by nebulization every 6 (six) hours.   75 mL   12   . apixaban (ELIQUIS) 5 MG TABS tablet   Oral   Take 5 mg by mouth 2 (two) times daily.         Marland Kitchen docusate sodium (COLACE) 100 MG capsule   Oral   Take 200 mg by mouth 2 (two) times daily.         . furosemide (LASIX) 40 MG tablet   Oral   Take 40 mg by mouth daily.         . insulin aspart  (NOVOLOG) 100 UNIT/ML injection   Subcutaneous   Inject 0-20 Units into the skin 3 (three) times daily with meals.   10 mL   11   . insulin glargine (LANTUS) 100 UNIT/ML injection   Subcutaneous   Inject 0.15 mLs (15 Units total) into the skin at bedtime.   10 mL   11   . sertraline (ZOLOFT) 50 MG tablet   Oral   Take 100 mg by mouth daily.         . theophylline (THEODUR) 200 MG 12 hr tablet   Oral   Take 1 tablet (200 mg total) by mouth every 12 (twelve) hours.         Marland Kitchen tiotropium (SPIRIVA) 18 MCG inhalation capsule   Inhalation   Place 1 capsule (18 mcg total) into inhaler and inhale daily.   30 capsule   12   . traZODone (DESYREL) 100 MG tablet   Oral   Take 100 mg by mouth at bedtime.         . bisacodyl (DULCOLAX) 10 MG suppository   Rectal   Place 1 suppository (10 mg total) rectally daily as needed for moderate constipation.  12 suppository   0   . insulin aspart (NOVOLOG) 100 UNIT/ML injection   Subcutaneous   Inject 0-5 Units into the skin at bedtime.   10 mL   11     Allergies Other; Penicillins; and Darvon  Family History  Problem Relation Age of Onset  . CAD Mother   . Diabetes type II Mother   . Hypertension Father   . CAD Father     Social History Social History  Substance Use Topics  . Smoking status: Never Smoker   . Smokeless tobacco: Never Used  . Alcohol Use: No    Review of Systems  Constitutional: Negative for fever. Eyes: Negative for visual changes. ENT: Negative for sore throat. Cardiovascular: Positive for chest pain. Respiratory: Positive for shortness of breath. Gastrointestinal: Negative for abdominal pain, vomiting and diarrhea. Genitourinary: Negative for dysuria. Musculoskeletal: Negative for back pain. Skin: Negative for rash. Neurological: Negative for headaches, focal weakness or numbness.   10-point ROS otherwise negative.  ____________________________________________   PHYSICAL  EXAM:  VITAL SIGNS: ED Triage Vitals  Enc Vitals Group     BP 11/03/14 0341 133/116 mmHg     Pulse Rate 11/03/14 0341 112     Resp 11/03/14 0341 18     Temp --      Temp src --      SpO2 11/03/14 0341 99 %     Weight 11/03/14 0341 415 lb (188.243 kg)     Height 11/03/14 0341  (1.499 m)     Head Cir --      Peak Flow --      Pain Score --      Pain Loc --      Pain Edu? --      Excl. in GC? --      Constitutional: Alert, apparent respiratory distress patient tripod position with CPAP in place Eyes: Conjunctivae are normal. PERRL. Normal extraocular movements. ENT   Head: Normocephalic and atraumatic.   Nose: No congestion/rhinnorhea.   Mouth/Throat: Mucous membranes are moist.   Neck: No stridor. Hematological/Lymphatic/Immunilogical: No cervical lymphadenopathy. Cardiovascular: Normal rate, regular rhythm. Normal and symmetric distal pulses are present in all extremities. No murmurs, rubs, or gallops. Respiratory: Tachypnea positive accessory muscle use bibasilar rales  Gastrointestinal: Soft and nontender. No distention. There is no CVA tenderness. Genitourinary: deferred Musculoskeletal: Nontender with normal range of motion in all extremities. No joint effusions.  No lower extremity tenderness nor edema. Neurologic:  Speaking 2 word phrases. No gross focal neurologic deficits are appreciated. Speech is normal.  Skin:  Skin is warm, dry and intact. No rash noted. Psychiatric: Mood and affect are normal. Speech and behavior are normal. Patient exhibits appropriate insight and judgment.  ____________________________________________    LABS (pertinent positives/negatives)  Labs Reviewed  CBC - Abnormal; Notable for the following:    WBC 14.2 (*)    MCHC 31.9 (*)    RDW 15.7 (*)    All other components within normal limits  COMPREHENSIVE METABOLIC PANEL - Abnormal; Notable for the following:    Potassium 2.4 (*)    Chloride 92 (*)    CO2 34 (*)     Glucose, Bld 173 (*)    BUN 21 (*)    Calcium 8.8 (*)    Albumin 2.6 (*)    ALT 11 (*)    Alkaline Phosphatase 128 (*)    GFR calc non Af Amer 55 (*)    All other components within normal limits  TROPONIN I - Abnormal; Notable for the following:    Troponin I 0.48 (*)    All other components within normal limits  BRAIN NATRIURETIC PEPTIDE - Abnormal; Notable for the following:    B Natriuretic Peptide 447.0 (*)    All other components within normal limits     ____________________________________________   EKG  ED ECG REPORT I, Selvin Yun, Leon N, the attending physician, personally viewed and interpreted this ECG.   Date: 11/03/2014  EKG Time: 3:39 AM  Rate: 164  Rhythm: Atrial fibrillation with rapid ventricular response  Axis: None  Intervals: Normal  ST&T Change: None   ____________________________________________    RADIOLOGY DG Chest Portable 1 View (Final result) Result time: 11/03/14 04:16:24   Final result by Rad Results In Interface (11/03/14 04:16:24)   Narrative:   CLINICAL DATA: Respiratory distress. Difficulty breathing.  EXAM: PORTABLE CHEST - 1 VIEW  COMPARISON: 09/18/2014  FINDINGS: Lung volumes are low. Unchanged cardiomegaly. Diffuse bilateral patchy airspace opacity, detailed evaluation limited by soft tissue attenuation from body habitus. No large pleural effusion or pneumothorax. No acute osseous abnormalities are seen.  IMPRESSION: Hypoventilatory chest with cardiomegaly. Diffuse bilateral patchy airspace opacities, may reflect pulmonary edema, infection or inflammatory process.   Electronically Signed By: Rubye Oaks M.D. On: 11/03/2014 04:16      Critical care 60 minutes    INITIAL IMPRESSION / ASSESSMENT AND PLAN / ED COURSE  Pertinent labs & imaging results that were available during my care of the patient were reviewed by me and considered in my medical decision making (see chart for  details).  Patient presents to the emergency department in acute respiratory distress most likely secondary to acute CHF exacerbation. In addition patient noted to have atrial fibrillation with rapid ventricular response as well as a positive troponin consistent with a NSTEMI. Applied on presentation to the emergency department inch of Nitropaste applied by EMS Lasix 40 mg IV given with improvement of respiratory status. She discussed with Dr. Betti Cruz for hospital admission for further management.  ____________________________________________   FINAL CLINICAL IMPRESSION(S) / ED DIAGNOSES  Final diagnoses:  NSTEMI (non-ST elevated myocardial infarction)  Acute exacerbation of CHF (congestive heart failure)  Atrial fibrillation with rapid ventricular response      Darci Current, MD 11/03/14 [phone]

## 2014-11-03 NOTE — ED Notes (Addendum)
Pt arrives to ED via [facility] from [facility] d/t respiratory distress. Per EMS pt reports breathing difficulty all day, pt is non-complaint with CPAP. Pt arrives from facility with DNR. Pt with medical history significant for CHF, COPD, and sleep apnea.

## 2014-11-04 DIAGNOSIS — J15212 Pneumonia due to Methicillin resistant Staphylococcus aureus: Secondary | ICD-10-CM | POA: Diagnosis not present

## 2014-11-04 LAB — BASIC METABOLIC PANEL
Anion gap: 14 (ref 5–15)
BUN: 25 mg/dL — AB (ref 6–20)
CO2: 35 mmol/L — ABNORMAL HIGH (ref 22–32)
CREATININE: 0.88 mg/dL (ref 0.44–1.00)
Calcium: 8.3 mg/dL — ABNORMAL LOW (ref 8.9–10.3)
Chloride: 86 mmol/L — ABNORMAL LOW (ref 101–111)
GFR calc Af Amer: >60 mL/min (ref >60)
GLUCOSE: 122 mg/dL — AB (ref 65–99)
POTASSIUM: 2.1 mmol/L — AB (ref 3.5–5.1)
SODIUM: 135 mmol/L (ref 135–145)

## 2014-11-04 LAB — POTASSIUM
POTASSIUM: 3 mmol/L — AB (ref 3.5–5.1)
Potassium: 2.6 mmol/L — CL (ref 3.5–5.1)

## 2014-11-04 LAB — GLUCOSE, CAPILLARY
GLUCOSE-CAPILLARY: 100 mg/dL — AB (ref 65–99)
Glucose-Capillary: 120 mg/dL — ABNORMAL HIGH (ref 65–99)
Glucose-Capillary: 110 mg/dL — ABNORMAL HIGH (ref 65–99)
Glucose-Capillary: 153 mg/dL — ABNORMAL HIGH (ref 65–99)

## 2014-11-04 LAB — MAGNESIUM: Magnesium: 1.4 mg/dL — ABNORMAL LOW (ref 1.7–2.4)

## 2014-11-04 MED ORDER — SPIRONOLACTONE 25 MG PO TABS
25.0000 mg | ORAL_TABLET | Freq: Every day | ORAL | Status: DC
  Administered 2014-11-04 – 2014-11-05 (×2): 25 mg via ORAL
  Filled 2014-11-04 (×2): qty 1

## 2014-11-04 MED ORDER — POTASSIUM CHLORIDE 10 MEQ/100ML IV SOLN
10.0000 meq | INTRAVENOUS | Status: AC
Start: 2014-11-04 — End: 2014-11-04
  Administered 2014-11-04 (×4): 10 meq via INTRAVENOUS
  Filled 2014-11-04 (×4): qty 100

## 2014-11-04 MED ORDER — DILTIAZEM HCL 60 MG PO TABS
60.0000 mg | ORAL_TABLET | Freq: Three times a day (TID) | ORAL | Status: DC
  Administered 2014-11-04 – 2014-11-11 (×16): 60 mg via ORAL
  Filled 2014-11-04 (×19): qty 1

## 2014-11-04 MED ORDER — MAGNESIUM SULFATE 4 GM/100ML IV SOLN
4.0000 g | Freq: Once | INTRAVENOUS | Status: AC
  Administered 2014-11-04: 4 g via INTRAVENOUS
  Filled 2014-11-04: qty 100

## 2014-11-04 MED ORDER — POLYMYXIN B-TRIMETHOPRIM 10000-0.1 UNIT/ML-% OP SOLN
2.0000 [drp] | OPHTHALMIC | Status: DC
  Administered 2014-11-04 – 2014-11-13 (×50): 2 [drp] via OPHTHALMIC
  Filled 2014-11-04 (×2): qty 10

## 2014-11-04 MED ORDER — POTASSIUM CHLORIDE CRYS ER 20 MEQ PO TBCR
20.0000 meq | EXTENDED_RELEASE_TABLET | Freq: Two times a day (BID) | ORAL | Status: AC
  Administered 2014-11-04 – 2014-11-05 (×4): 20 meq via ORAL
  Filled 2014-11-04 (×4): qty 1

## 2014-11-04 MED ORDER — CARVEDILOL 12.5 MG PO TABS: 12.5000 mg | ORAL_TABLET | Freq: Two times a day (BID) | ORAL | Status: DC

## 2014-11-04 MED ORDER — FUROSEMIDE 10 MG/ML IJ SOLN
40.0000 mg | Freq: Once | INTRAMUSCULAR | Status: AC
  Administered 2014-11-04: 40 mg via INTRAVENOUS
  Filled 2014-11-04: qty 4

## 2014-11-04 NOTE — Progress Notes (Signed)
   SUBJECTIVE: Pt states SOB is stable, mildly improved. No CP   Filed Vitals:   11/04/14 0900 11/04/14 0924 11/04/14 1000 11/04/14 1100  BP: 96/73 98/77 115/75 102/57  Pulse: 90 102 82 93  Temp:      TempSrc:      Resp: Height:      Weight:      SpO2: 97%  98% 98%    Intake/Output Summary (Last 24 hours) at 11/04/14 1133 Last data filed at 11/04/14 1100  Gross per 24 hour  Intake 440.08 ml  Output   2230 ml  Net -1789.92 ml    LABS: Basic Metabolic Panel:  Recent Labs  13/08/65 0352 11/04/14 0458  NA 138 135  K 2.4* 2.1*  CL 92* 86*  CO2 34* 35*  GLUCOSE 173* 122*  BUN 21* 25*  CREATININE 1.00 0.88  CALCIUM 8.8* 8.3*   Liver Function Tests:  Recent Labs  11/03/14 0352  AST 21  ALT 11*  ALKPHOS 128*  BILITOT 0.6  PROT 6.5  ALBUMIN 2.6*   No results for input(s): LIPASE, AMYLASE in the last 72 hours. CBC:  Recent Labs  11/03/14 0352  WBC 14.2*  HGB 12.4  HCT 39.0  MCV 90.4  PLT 157   Cardiac Enzymes:  Recent Labs  11/03/14 0352 11/03/14 0915 11/03/14 1503  TROPONINI 0.48* 0.04* 0.05*   BNP: Invalid input(s): POCBNP D-Dimer: No results for input(s): DDIMER in the last 72 hours. Hemoglobin A1C: No results for input(s): HGBA1C in the last 72 hours. Fasting Lipid Panel: No results for input(s): CHOL, HDL, LDLCALC, TRIG, CHOLHDL, LDLDIRECT in the last 72 hours. Thyroid Function Tests: No results for input(s): TSH, T4TOTAL, T3FREE, THYROIDAB in the last 72 hours.  Invalid input(s): FREET3 Anemia Panel: No results for input(s): VITAMINB12, FOLATE, FERRITIN, TIBC, IRON, RETICCTPCT in the last 72 hours.   PHYSICAL EXAM General: Well developed, well nourished, in no acute distress HEENT:  Normocephalic and atramatic Neck:  No JVD.  Lungs: rhonchi b/l  Heart: irregularly, irregular Abdomen: Bowel sounds are positive, abdomen soft and non-tender  Msk:  Back normal, normal gait. Normal strength and tone for  age. Extremities: No clubbing, cyanosis or edema.   Neuro: Alert and oriented X 3. Psych:  Good affect, responds appropriately  TELEMETRY: Reviewed telemetry pt in: a-fib 80 VR  ASSESSMENT AND PLAN: acute respiratory distress 2/2 pulmonary edema vs PNA. Echo shows EF 40%, grade 1 diastolic dysfunction, mild MR, mild pulmonary HTN. Advise change metoprolol to carvedilol, convert cardizem to PO (  TID), add spironolactone for diuresis since lasix was held 2/2 hypokalemia. Spoke with pts husband regarding cardiac issues and urged him to have her f/u as outpatient.   -A-fib now rate controlled and protected with eliquis. Urged pt to wear CPAP once back at rehab facility as many of these cardio-pulmonary issues are likely 2/2 non-compliance with CPAP.   Patient and plan discussed with supervising provider, Dr. Adrian Blackwater, who agrees with above findings.   Alinda Sierras Margarito Courser [facility]  11/04/2014 11:33 AM

## 2014-11-04 NOTE — Progress Notes (Signed)
Pt is alert and oriented no complaints on SO noow. Pt remains on bi cap. [id number]. Lung sound rochi. cardizem drip is in progress.HR in bet 80's- . Foley in place.resting comfortably in bed wihourt any distress. Continue to observe closely.

## 2014-11-04 NOTE — Consult Note (Signed)
WOC wound consult note Reason for Consult:Intertriginous dermatitis in the inframammary, inguinal, sub-pannicular and left leg skin folds. Tweo open areas, all others have erythema and tenderness with expectation of opening.  POC is directed at maintaining skin integrity and repairing the two areas that have openings Wound type:Moisture associated skin damage (MASD), specifically intertriginous dermatitis (ITD) Pressure Ulcer POA: No Measurement: left lateral LE crease with 3cm x 0.5cm x 0.2 open area.  Left lateral pannus: 13cm x 0.2cm x 0.2cm. Wound ZOX:[id number] (partial thickness), pink, moist Drainage (amount, consistency, odor) scant serous with strong musty odor Periwound: erythematous Dressing procedure/placement/frequency: I have discontinued twice daily Nystatin powder in favor of using the house antimicrobial textile.  I have provided Nursing with guidance for cleansing and application of this product. Additionally, I have ordered a bariatric bed for improved positioning and for its low air loss feature to assist in the management of moisture/microclimate. Thank you for this consultation. The WOC nursing team will not follow, but will remain available to this patient, the nursing and medical teams.  Please re-consult if needed. Thanks, Ladona Mow, MSN, RN, GNP, [place], CWON-AP [phone])

## 2014-11-04 NOTE — Progress Notes (Signed)
Pharmacy electrolyte monitoring.  Potassium level 2.1. KCl 10 mEq/hr x 4 doses ordered. 20 mEq PO bid x4 doses ordered also. K+ and Mg labs ordered after completion of K+ runs to evaluate progress.  8/28:  K @ 11:40 = 2.6 KCl 20 mEq PO BID ordered. Will order additional KCl 10 mEq X 4 .  Will recheck K on 8/29 with AM labs.  Princella Ion, PharmD 11/04/2014

## 2014-11-04 NOTE — Progress Notes (Signed)
Pharmacy electrolyte monitoring.  Potassium level 2.1. KCl 10 mEq/hr x 4 doses ordered. 20 mEq PO bid x4 doses ordered also. K+ and Mg labs ordered after completion of K+ runs to evaluate progress.  Fulton Reek, PharmD, BCPS  11/04/2014

## 2014-11-04 NOTE — Progress Notes (Signed)
Pt remains alert and oriented currently off bipap on 4L O2 via nasal canula with O2 sats mid 90's; vss; adequate uop via foley; afibb on cardiac monitor rate controlled in the 70-90's; pt currently has diet order and tolerating well; will continue to monitor and assess pt

## 2014-11-04 NOTE — Progress Notes (Signed)
Spoke with Dr. Letitia Libra informed md per Dr. Claris Che progress note it stated pt should remain on lasix bid however there are no orders for pt to receive lasix. Dr. Letitia Libra stated to administer 40 mg lasix once via iv push and will reassess in the am

## 2014-11-04 NOTE — Progress Notes (Signed)
[facility] - [place] at [facility]   PATIENT NAME: Eileen [name]    MR#:  [id number]  DATE OF BIRTH:  08/31/1945  SUBJECTIVE:  Alert, oriented, states that she is breathing much better, continues on BiPAP but decreasing FiO2  REVIEW OF SYSTEMS:   Review of Systems  Constitutional: Negative for fever.  Respiratory: Negative for shortness of breath.   Cardiovascular: Negative for chest pain and palpitations.  Gastrointestinal: Negative for nausea, vomiting and abdominal pain.  Genitourinary: Negative for dysuria.   Tolerating Diet:no Tolerating PT: no  DRUG ALLERGIES:   Allergies  Allergen Reactions  . Other Other (See Comments)    darvocet--pt reports blistering with this medication  . Penicillins Nausea And Vomiting  . Darvon [Propoxyphene] Other (See Comments)    Blisters    VITALS:  Blood pressure 87/44, pulse 77, temperature 98.4 F (36.9 C), temperature source Axillary, resp. rate 15, height  (1.499 m), weight 149.6 kg (329 lb 12.9 oz), SpO2 97 %.  PHYSICAL EXAMINATION:   Physical Exam Constitutional: She is oriented to person, place, and time. Obese  In moderate respiratory distress +, on BiPAP  HENT:  Head: Normocephalic and atraumatic.  Right Ear: External ear normal.  Left Ear: External ear normal.  Mouth/Throat: No oropharyngeal exudate.  Eyes: EOM are normal. Pupils are equal, round, and reactive to light. No scleral icterus.  Neck: Normal range of motion. Neck supple. No JVD present. No thyromegaly present.  Cardiovascular: Normal rate, normal heart sounds and intact distal pulses. Exam reveals no friction rub.  No murmur heard. Irregularly irregular  Respiratory: She is in respiratory distress. She has no wheezes. She has rales. On BIPAP GI: Soft. Bowel sounds are normal. She exhibits no distension and no mass. There is no tenderness. There is no rebound and no guarding.  Musculoskeletal: Normal range of motion.  She exhibits no edema.  Lymphadenopathy:   She has no cervical adenopathy.  Neurological: unable to assess being on BIPAP Skin: Skin is warm. No rash noted. No erythema.  Psychiatric: lethargicl.   LABORATORY PANEL:   CBC  Recent Labs Lab 11/03/14 0352  WBC 14.2*  HGB 12.4  HCT 39.0  PLT 157    Chemistries   Recent Labs Lab 11/03/14 0352 11/04/14 0458 11/04/14 1141  NA 138 135  --   K 2.4* 2.1* 2.6*  CL 92* 86*  --   CO2 34* 35*  --   GLUCOSE 173* 122*  --   BUN 21* 25*  --   CREATININE 1.00 0.88  --   CALCIUM 8.8* 8.3*  --   MG  --   --  1.4*  AST 21  --   --   ALT 11*  --   --   ALKPHOS 128*  --   --   BILITOT 0.6  --   --     Cardiac Enzymes  Recent Labs Lab 11/03/14 1503  TROPONINI 0.05*    RADIOLOGY:  Dg Chest Portable 1 View  11/03/2014   CLINICAL DATA:  Respiratory distress.  Difficulty breathing.  EXAM: PORTABLE CHEST - 1 VIEW  COMPARISON:  09/18/2014  FINDINGS: Lung volumes are low. Unchanged cardiomegaly. Diffuse bilateral patchy airspace opacity, detailed evaluation limited by soft tissue attenuation from body habitus. No large pleural effusion or pneumothorax. No acute osseous abnormalities are seen.  IMPRESSION: Hypoventilatory chest with cardiomegaly. Diffuse bilateral patchy airspace opacities, may reflect pulmonary edema, infection or inflammatory process.   Electronically Signed  By: Rubye Oaks M.D.   On: 11/03/2014 04:16     ASSESSMENT AND PLAN:   69 year old female with history of multiple medical problems including obstructive sleep apnea on CPAP, noncompliance to CPAP use, hypertension, diastolic congestive heart failure, atrial fibrillation on Eliquis, COPD, diabetes mellitus type 2, depression, on DO NOT RESUSCITATE status brought in with complains of acute respiratory distress with hypoxia.  1. Acute and chronic respiratory failure with hypoxia secondary to acute pulmonary edema. Underlying pneumonia could not be ruled  out. -Continue BIPAP, obtain pulmonology consultation  -continue IV Levaquin, has been afebrile, mild leukocytosis, no blood cultures were performed on admission  2. Acute pulmonary edema, history of congestive heart failure, recent echo of August 2015 ejection fraction 50-50%. -Continue IV lasix bid,  - -2 L yesterday - Repeat chest x-ray in the morning  3. Leukocytosis with bilateral patchy airspace opacities.  - Possible pneumonia, continue Levaquin, sputum culture pending  4. Elevated troponin-demand ischemia versus non-STEMI. - Likely demand ischemia, troponin has decreased significantly with treatment of CHF  5. Atrial fibrillation with rapid ventricle response, history of chronic atrial fibrillation on Eliquis. - continue BiPAP, IV furosemide, Nitropaste, cycle cardiac enzymes, low dose beta blocker.   6. Hypokalemia -supplement potassium, follow-up BMP.  7. Obstructive sleep apnea, continue BiPAP.  8. COPD, stable. Continue home medications.  9. Diabetes mellitus type 2 on Lantus. Stable. Continue  SSI.  10. Hypertension, stable.  Case discussed with Care Management/Social Worker. Management plans discussed with the patient, family and they are in agreement.  CODE STATUS: DNR  DVT Prophylaxis: eliquis  TOTAL criticalTIME TAKING CARE OF THIS PATIENT: 45 minutes.  >50% time spent on counselling and coordination of care Dr Mariel Sleet, CATHERINE M.D on 11/04/2014 at 2:49 PM  Between 7am to 6pm - Pager - [phone]  After 6pm go to [email or web address] - password [email or web address] [facility]  [facility]  Office  [phone]  CC: Primary care physician; Laurier Nancy, MD

## 2014-11-05 ENCOUNTER — Inpatient Hospital Stay: Payer: PPO

## 2014-11-05 DIAGNOSIS — I4891 Unspecified atrial fibrillation: Secondary | ICD-10-CM

## 2014-11-05 LAB — CBC
HEMATOCRIT: 36.9 % (ref 35.0–47.0)
Hemoglobin: 11.8 g/dL — ABNORMAL LOW (ref 12.0–16.0)
MCH: 28.6 pg (ref 26.0–34.0)
MCHC: 32 g/dL (ref 32.0–36.0)
MCV: 89.5 fL (ref 80.0–100.0)
PLATELETS: 144 10*3/uL — AB (ref 150–440)
RBC: 4.12 MIL/uL (ref 3.80–5.20)
RDW: 15.3 % — ABNORMAL HIGH (ref 11.5–14.5)
WBC: 10.3 10*3/uL (ref 3.6–11.0)

## 2014-11-05 LAB — BASIC METABOLIC PANEL
ANION GAP: 13 (ref 5–15)
BUN: 21 mg/dL — AB (ref 6–20)
CHLORIDE: 85 mmol/L — AB (ref 101–111)
CO2: 34 mmol/L — AB (ref 22–32)
Calcium: 8 mg/dL — ABNORMAL LOW (ref 8.9–10.3)
Creatinine, Ser: 0.73 mg/dL (ref 0.44–1.00)
GFR calc Af Amer: >60 mL/min (ref >60)
GFR calc non Af Amer: >60 mL/min (ref >60)
GLUCOSE: 117 mg/dL — AB (ref 65–99)
POTASSIUM: 2.8 mmol/L — AB (ref 3.5–5.1)
Sodium: 132 mmol/L — ABNORMAL LOW (ref 135–145)

## 2014-11-05 LAB — PHOSPHORUS: Phosphorus: 2.6 mg/dL (ref 2.5–4.6)

## 2014-11-05 LAB — MAGNESIUM: Magnesium: 1.9 mg/dL (ref 1.7–2.4)

## 2014-11-05 LAB — POTASSIUM: POTASSIUM: 3.7 mmol/L (ref 3.5–5.1)

## 2014-11-05 LAB — GLUCOSE, CAPILLARY
GLUCOSE-CAPILLARY: 117 mg/dL — AB (ref 65–99)
GLUCOSE-CAPILLARY: 181 mg/dL — AB (ref 65–99)
Glucose-Capillary: 121 mg/dL — ABNORMAL HIGH (ref 65–99)
Glucose-Capillary: 114 mg/dL — ABNORMAL HIGH (ref 65–99)
Glucose-Capillary: 192 mg/dL — ABNORMAL HIGH (ref 65–99)

## 2014-11-05 MED ORDER — MUPIROCIN 2 % EX OINT
TOPICAL_OINTMENT | Freq: Two times a day (BID) | CUTANEOUS | Status: DC
  Administered 2014-11-05 – 2014-11-13 (×16): via NASAL
  Filled 2014-11-05: qty 22

## 2014-11-05 MED ORDER — BUDESONIDE 0.5 MG/2ML IN SUSP: 0.5000 mg | Freq: Two times a day (BID) | RESPIRATORY_TRACT | Status: DC

## 2014-11-05 MED ORDER — GUAIFENESIN-CODEINE 100-10 MG/5ML PO SOLN
10.0000 mL | Freq: Four times a day (QID) | ORAL | Status: DC | PRN
  Administered 2014-11-05 – 2014-11-11 (×6): 10 mL via ORAL
  Filled 2014-11-05 (×6): qty 10

## 2014-11-05 MED ORDER — CARVEDILOL 6.25 MG PO TABS
6.2500 mg | ORAL_TABLET | Freq: Two times a day (BID) | ORAL | Status: DC
  Administered 2014-11-05 – 2014-11-12 (×11): 6.25 mg via ORAL
  Filled 2014-11-05 (×15): qty 1

## 2014-11-05 MED ORDER — PREDNISONE 20 MG PO TABS: 40.0000 mg | ORAL_TABLET | Freq: Every day | ORAL | Status: DC

## 2014-11-05 MED ORDER — FUROSEMIDE 10 MG/ML IJ SOLN
40.0000 mg | Freq: Two times a day (BID) | INTRAMUSCULAR | Status: DC
  Administered 2014-11-05 – 2014-11-10 (×9): 40 mg via INTRAVENOUS
  Filled 2014-11-05 (×9): qty 4

## 2014-11-05 MED ORDER — SPIRONOLACTONE 25 MG PO TABS
50.0000 mg | ORAL_TABLET | Freq: Every day | ORAL | Status: DC
  Administered 2014-11-06 – 2014-11-13 (×6): 50 mg via ORAL
  Filled 2014-11-05 (×8): qty 2

## 2014-11-05 MED ORDER — BUDESONIDE 0.5 MG/2ML IN SUSP
0.5000 mg | Freq: Two times a day (BID) | RESPIRATORY_TRACT | Status: DC
  Administered 2014-11-05 – 2014-11-13 (×16): 0.5 mg via RESPIRATORY_TRACT
  Filled 2014-11-05 (×16): qty 2

## 2014-11-05 MED ORDER — POTASSIUM CHLORIDE 10 MEQ/100ML IV SOLN
10.0000 meq | INTRAVENOUS | Status: AC
  Administered 2014-11-05 (×4): 10 meq via INTRAVENOUS

## 2014-11-05 MED ORDER — LEVOFLOXACIN 750 MG PO TABS
750.0000 mg | ORAL_TABLET | Freq: Every day | ORAL | Status: DC
  Administered 2014-11-06 – 2014-11-11 (×6): 750 mg via ORAL
  Filled 2014-11-05 (×6): qty 1

## 2014-11-05 MED ORDER — METHYLPREDNISOLONE SODIUM SUCC 125 MG IJ SOLR
60.0000 mg | Freq: Two times a day (BID) | INTRAMUSCULAR | Status: DC
  Administered 2014-11-05 – 2014-11-07 (×5): 60 mg via INTRAVENOUS
  Filled 2014-11-05 (×5): qty 2

## 2014-11-05 MED ORDER — POTASSIUM CHLORIDE 10 MEQ/100ML IV SOLN
10.0000 meq | INTRAVENOUS | Status: DC
Start: 2014-11-05 — End: 2014-11-05
  Filled 2014-11-05 (×4): qty 100

## 2014-11-05 MED ORDER — SPIRONOLACTONE 25 MG PO TABS
25.0000 mg | ORAL_TABLET | Freq: Once | ORAL | Status: AC
  Administered 2014-11-05: 25 mg via ORAL
  Filled 2014-11-05: qty 1

## 2014-11-05 MED ORDER — ALBUTEROL SULFATE (2.5 MG/3ML) 0.083% IN NEBU
2.5000 mg | INHALATION_SOLUTION | RESPIRATORY_TRACT | Status: DC
  Administered 2014-11-05 – 2014-11-13 (×49): 2.5 mg via RESPIRATORY_TRACT
  Filled 2014-11-05 (×48): qty 3

## 2014-11-05 NOTE — Care Management Note (Signed)
Case Management Note  Patient Details  Name: [name] MRN: [id number] Date of Birth: 1945-03-13  Subjective/Objective:    Patient from [facility].   CSW following.               Action/Plan:   Expected Discharge Date:                  Expected Discharge Plan:  Skilled Nursing Facility  In-House Referral:  Clinical Social Work  Discharge planning Services     Post Acute Care Choice:    Choice offered to:     DME Arranged:    DME Agency:     HH Arranged:    HH Agency:     Status of Service:  In process, will continue to follow  Medicare Important Message Given:    Date Medicare IM Given:    Medicare IM give by:    Date Additional Medicare IM Given:    Additional Medicare Important Message give by:     If discussed at Long Length of Stay Meetings, dates discussed:    Additional Comments:  Marily Memos, RN 11/05/2014, 2:49 PM

## 2014-11-05 NOTE — Progress Notes (Signed)
Pt remains alert and oriented; on 4L O2 via nasal canula with O2 sats mid 90's; vss; adequate uop via foley; administered prn medication for c/o cough pts cough now decreased; afibb on cardiac monitor rate controlled; remains stepdown status; tolerating diet; will continue to monitor and assess pt

## 2014-11-05 NOTE — Progress Notes (Signed)
[facility] - [place] at [facility]   PATIENT NAME: Eileen [name]    MR#:  [id number]  DATE OF BIRTH:  11/21/1945  SUBJECTIVE:   Breathing improved, now on nasal cannula for greater than 2 hours.  REVIEW OF SYSTEMS:   Review of Systems  Constitutional: Negative for fever.  Respiratory: Negative for shortness of breath.   Cardiovascular: Negative for chest pain and palpitations.  Gastrointestinal: Negative for nausea, vomiting and abdominal pain.  Genitourinary: Negative for dysuria.   Tolerating Diet:no Tolerating PT: no  DRUG ALLERGIES:   Allergies  Allergen Reactions  . Other Other (See Comments)    darvocet--pt reports blistering with this medication  . Penicillins Nausea And Vomiting  . Darvon [Propoxyphene] Other (See Comments)    Blisters    VITALS:  Blood pressure 102/56, pulse 76, temperature 98.1 F (36.7 C), temperature source Oral, resp. rate 21, height 4\' 11"  (1.499 m), weight 150.8 kg (332 lb 7.3 oz), SpO2 96 %.  PHYSICAL EXAMINATION:   Physical Exam Constitutional: She is oriented to person, place, and time. Obese  HENT:  Head: Normocephalic and atraumatic.  Right Ear: External ear normal.  Left Ear: External ear normal.  Mouth/Throat: No oropharyngeal exudate.  Eyes: EOM are normal. Pupils are equal, round, and reactive to light. No scleral icterus.  Neck: Normal range of motion. Neck supple. No JVD present. No thyromegaly present.  Cardiovascular: Normal rate, normal heart sounds and intact distal pulses. Exam reveals no friction rub.  No murmur heard. Irregularly irregular  Respiratory:  She has no wheezes. She has rales. On nasal cannula GI: Soft. Bowel sounds are normal. She exhibits no distension and no mass. There is no tenderness. There is no rebound and no guarding.  Musculoskeletal: Normal range of motion. She exhibits no edema.  Lymphadenopathy:   She has no cervical adenopathy.  Neurological: unable to  assess being on BIPAP Skin: Skin is warm. No rash noted. No erythema.  Psychiatric: lethargicl.   LABORATORY PANEL:   CBC  Recent Labs Lab 11/05/14 0342  WBC 10.3  HGB 11.8*  HCT 36.9  PLT 144*    Chemistries   Recent Labs Lab 11/03/14 0352  11/05/14 0342  NA 138  < > 132*  K 2.4*  < > 2.8*  CL 92*  < > 85*  CO2 34*  < > 34*  GLUCOSE 173*  < > 117*  BUN 21*  < > 21*  CREATININE 1.00  < > 0.73  CALCIUM 8.8*  < > 8.0*  MG  --   < > 1.9  AST 21  --   --   ALT 11*  --   --   ALKPHOS 128*  --   --   BILITOT 0.6  --   --   < > = values in this interval not displayed.  Cardiac Enzymes  Recent Labs Lab 11/03/14 1503  TROPONINI 0.05*    RADIOLOGY:  Dg Chest Port 1 View  11/05/2014   CLINICAL DATA:  Pulmonary edema.  EXAM: PORTABLE CHEST - 1 VIEW  COMPARISON:  11/03/2014  FINDINGS: Persistent elevation of the right hemidiaphragm. Vascular crowding in the right hilum. Heart size is upper limits of normal. The left lung base is incompletely imaged and difficult to exclude consolidation or opacification in this region. There continues to be airspace disease in the left lower lung. Improved aeration in the right lung.  IMPRESSION: Persistent airspace disease in the left lower lung.  Slightly improved  aeration in the right lung.   Electronically Signed   By: Richarda Overlie M.D.   On: 11/05/2014 07:28     ASSESSMENT AND PLAN:   69 year old female with history of multiple medical problems including obstructive sleep apnea on CPAP, noncompliance to CPAP use, hypertension, diastolic congestive heart failure, atrial fibrillation on Eliquis, COPD, diabetes mellitus type 2, depression, on DO NOT RESUSCITATE status brought in with complains of acute respiratory distress with hypoxia.  1. Acute and chronic respiratory failure with hypoxia secondary to acute pulmonary edema. Underlying pneumonia could not be ruled out. -Has come off of BiPAP now on nasal cannula  -continue IV Levaquin,  has been afebrile, mild leukocytosis, no blood cultures were performed on admission - Appreciate pulmonology consultation, agree with adding IV steroid, dulera  2. Acute pulmonary edema, history of congestive heart failure, recent echo  ejection fraction 40%. Grade 1 diastolic dysfunction -Continue IV lasix bid, net -4 L - Repeat chest x-ray with improved aeration right lung, persistent opacity in the left   3. Leukocytosis with bilateral patchy airspace opacities.  - Possible pneumonia, continue Levaquin, sputum culture pending  4. Elevated troponin-demand ischemia versus non-STEMI. - Likely demand ischemia, troponin has decreased significantly with treatment of CHF - Appreciate cardiology consultation  5. Atrial fibrillation with rapid ventricle response, history of chronic atrial fibrillation on Eliquis. - continue BiPAP, IV furosemide, Nitropaste, cycle cardiac enzymes, low dose beta blocker.   6. Hypokalemia -supplement potassium, follow-up BMP. - Agree with adding spironolactone  7. Obstructive sleep apnea, continue BiPAP.  8. COPD, stable. Continue home medications.  9. Diabetes mellitus type 2 on Lantus. Stable. Continue  SSI.  10. Hypertension, stable.  Case discussed with Care Management/Social Worker. Management plans discussed with the patient, no family is present  CODE STATUS: DNR  DVT Prophylaxis: eliquis  TOTAL criticalTIME TAKING CARE OF THIS PATIENT: 45 minutes.  >50% time spent on counselling and coordination of care    Elby Showers M.D on 11/05/2014 at 1:36 PM  Between 7am to 6pm - Pager - [phone]  After 6pm go to [email or web address] - password [email or web address] [facility]  [facility]  Office  [phone]  CC: Primary care physician; Laurier Nancy, MD

## 2014-11-05 NOTE — Care Management Important Message (Signed)
Important Message  Patient Details  Name: Eileen [name] MRN: [id number] Date of Birth: 1945/04/05   Medicare Important Message Given:  Yes-second notification given    Marily Memos, RN 11/05/2014, 3:56 PM

## 2014-11-05 NOTE — Consult Note (Signed)
Date: 11/05/2014,   MRN# [id number] Eileen [name] 07-Jul-1945 Code Status:     Code Status Orders        Start     Ordered   11/18/14 [phone]  Do not attempt resuscitation (DNR)   Continuous    Question Answer Comment  In the event of cardiac or respiratory ARREST Do not call a "code blue"   In the event of cardiac or respiratory ARREST Do not perform Intubation, CPR, defibrillation or ACLS   In the event of cardiac or respiratory ARREST Use medication by any route, position, wound care, and other measures to relive pain and suffering. May use oxygen, suction and manual treatment of airway obstruction as needed for comfort.   Comments RN may pronounce death      11/18/14 5621    Advance Directive Documentation        Most Recent Value   Type of Advance Directive  Out of facility DNR (pink MOST or yellow form)   Pre-existing out of facility DNR order (yellow form or pink MOST form)  Yellow form placed in chart (order not valid for inpatient use)   "MOST" Form in Place?       Hosp day:@LENGTHOFSTAYDAYS @ Referring MD: @     PCP:      AdmissionWeight: (!) 415 lb (188.243 kg)                 CurrentWeight: (!) 332 lb 7.3 oz (150.8 kg) Eileen [name] is a 69 y.o. old female seen in consultation for SOB    CHIEF COMPLAINT:   Patient with SOB and resp distress   HISTORY OF PRESENT ILLNESS   69 yo morbidly obese white female with h/o OSA and oxygen dependant COPD admitted to ICU for acute resp distress,  Also with atrial fibrillation on Eliquis, diabetes mellitus type 2, on DO NOT RESUSCITATE status was found to be in respiratory distress with hypoxia at [facility] with O2 saturations in the mid 70s to low 80s, brought into the emergency room for further evaluation.   Patient placed on BiPAP. Chest x-ray revealed diffuse bilateral patchy airspace opacities suggestive of acute pulmonary edema. Patient received Nitropaste, IV furosemide. EKG showed atrial  fibrillation with rapid ventricular rate of 164 bpm. Lab work revealed potassium of 2.4, troponin 0.48, WBC 14.2. Patient was also given IV Levaquin in view of elevated WBC and bilateral airspace opacities for possible pneumonia.   Patient received IV Cardizem 10 mg 1. Hospitalist service was consulted for further management. Patient is currently on BiPAP mask and O2 saturations are running in the upper 90s. Patient is alert awake and oriented, following commands, states doing slightly better. Patient denies any history of chest pain, fever, cough, nausea, vomiting, diarrhea, abdominal pain.      PAST MEDICAL HISTORY   Past Medical History  Diagnosis Date  . COPD (chronic obstructive pulmonary disease)   . Arthritis   . Hypertension   . Diabetes mellitus without complication   . CHF (congestive heart failure)   . Sleep apnea   . Depression      SURGICAL HISTORY   Past Surgical History  Procedure Laterality Date  . Laminectomy    . Back surgery       FAMILY HISTORY   Family History  Problem Relation Age of Onset  . CAD Mother   . Diabetes type II Mother   . Hypertension Father   . CAD Father      SOCIAL HISTORY  Social History  Substance Use Topics  . Smoking status: Never Smoker   . Smokeless tobacco: Never Used  . Alcohol Use: No     MEDICATIONS    Home Medication:  No current outpatient prescriptions on file.  Current Medication:  Current facility-administered medications:  .  0.9 %  sodium chloride infusion, 250 mL, Intravenous, PRN, Crissie Figures, MD .  acetaminophen (TYLENOL) tablet 650 mg, 650 mg, Oral, Q6H PRN **OR** acetaminophen (TYLENOL) suppository 650 mg, 650 mg, Rectal, Q6H PRN, Crissie Figures, MD .  acetaZOLAMIDE (DIAMOX) tablet 250 mg, 250 mg, Oral, BID, Crissie Figures, MD, 250 mg at 11/04/14 2201 .  antiseptic oral rinse (CPC / CETYLPYRIDINIUM CHLORIDE 0.05%) solution 7 mL, 7 mL, Mouth Rinse, q12n4p, Enedina Finner, MD, 7 mL at  11/04/14 1650 .  apixaban (ELIQUIS) tablet 5 mg, 5 mg, Oral, BID, Crissie Figures, MD, 5 mg at 11/04/14 2156 .  aspirin EC tablet 81 mg, 81 mg, Oral, Daily, Crissie Figures, MD, 81 mg at 11/04/14 0925 .  bisacodyl (DULCOLAX) suppository 10 mg, 10 mg, Rectal, Daily PRN, Crissie Figures, MD .  carvedilol (COREG) tablet 12.5 mg, 12.5 mg, Oral, BID WC, Rise Mu, PA-C, 12.5 mg at 11/04/14 1722 .  chlorhexidine (PERIDEX) 0.12 % solution 15 mL, 15 mL, Mouth Rinse, BID, Enedina Finner, MD, 15 mL at 11/04/14 2210 .  diltiazem (CARDIZEM) tablet 60 mg, 60 mg, Oral, 3 times per day, Rise Mu, PA-C, 60 mg at 11/05/14 0617 .  docusate sodium (COLACE) capsule 200 mg, 200 mg, Oral, BID, Crissie Figures, MD, 200 mg at 11/04/14 2157 .  insulin aspart (novoLOG) injection 0-15 Units, 0-15 Units, Subcutaneous, TID WC, Crissie Figures, MD, 2 Units at 11/05/14 [phone] .  insulin glargine (LANTUS) injection 15 Units, 15 Units, Subcutaneous, QHS, Crissie Figures, MD, 15 Units at 11/04/14 2202 .  ipratropium-albuterol (DUONEB) 0.5-2.5 (3) MG/3ML nebulizer solution 3 mL, 3 mL, Nebulization, Q6H, Crissie Figures, MD, 3 mL at 11/05/14 0811 .  levofloxacin (LEVAQUIN) IVPB 750 mg, 750 mg, Intravenous, Q24H, Crissie Figures, MD, 750 mg at 11/04/14 1112 .  ondansetron (ZOFRAN) tablet 4 mg, 4 mg, Oral, Q6H PRN **OR** ondansetron (ZOFRAN) injection 4 mg, 4 mg, Intravenous, Q6H PRN, Crissie Figures, MD .  potassium chloride 10 mEq in 100 mL IVPB, 10 mEq, Intravenous, Q1 Hr x 4, Crissie Figures, MD, 10 mEq at 11/05/14 0848 .  potassium chloride SA (K-DUR,KLOR-CON) CR tablet 20 mEq, 20 mEq, Oral, BID, Crissie Figures, MD, 20 mEq at 11/04/14 2156 .  sertraline (ZOLOFT) tablet 100 mg, 100 mg, Oral, Daily, Crissie Figures, MD, 100 mg at 11/04/14 0924 .  sodium chloride 0.9 % injection 3 mL, 3 mL, Intravenous, Q12H, Crissie Figures, MD, 3 mL at 11/04/14 2200 .  sodium chloride 0.9 % injection 3 mL, 3 mL, Intravenous,  PRN, Crissie Figures, MD .  spironolactone (ALDACTONE) tablet 25 mg, 25 mg, Oral, Daily, Rise Mu, PA-C, 25 mg at 11/04/14 1210 .  theophylline (THEODUR) 12 hr tablet 200 mg, 200 mg, Oral, Q12H, Crissie Figures, MD, 200 mg at 11/04/14 2200 .  tiotropium (SPIRIVA) inhalation capsule 18 mcg, 18 mcg, Inhalation, Daily, Crissie Figures, MD, 18 mcg at 11/04/14 0925 .  traZODone (DESYREL) tablet 100 mg, 100 mg, Oral, QHS, Crissie Figures, MD, 100 mg at 11/04/14 2157 .  trimethoprim-polymyxin b (POLYTRIM) ophthalmic solution 2 drop, 2 drop, Left  Eye, 6 times per day, Gale Journey, MD, 2 drop at 11/05/14 [phone]    ALLERGIES   Other; Penicillins; and Darvon     REVIEW OF SYSTEMS   Review of Systems  Constitutional: Negative for fever, chills, weight loss, malaise/fatigue and diaphoresis.  HENT: Negative for congestion and hearing loss.   Eyes: Negative for blurred vision and double vision.  Respiratory: Positive for cough, shortness of breath and wheezing.   Cardiovascular: Negative for chest pain, palpitations and orthopnea.  Gastrointestinal: Negative for heartburn, nausea, vomiting, abdominal pain, diarrhea, constipation and blood in stool.  Genitourinary: Negative for dysuria and urgency.  Musculoskeletal: Negative for myalgias, back pain and neck pain.  Skin: Negative for rash.  Neurological: Negative for dizziness, tingling, tremors, weakness and headaches.  Endo/Heme/Allergies: Does not bruise/bleed easily.  Psychiatric/Behavioral: Negative for depression, suicidal ideas and substance abuse.  All other systems reviewed and are negative.    VS: BP 88/57 mmHg  Pulse 77  Temp(Src) 98.1 F (36.7 C) (Oral)  Resp 31  Ht 4\' 11"  (1.499 m)  Wt 332 lb 7.3 oz (150.8 kg)  BMI 67.11 kg/m2  SpO2 96%     PHYSICAL EXAM  Physical Exam  Constitutional: She is oriented to person, place, and time. She appears well-developed and well-nourished. She appears distressed.  HENT:   Head: Normocephalic and atraumatic.  Mouth/Throat: No oropharyngeal exudate.  Eyes: EOM are normal. Pupils are equal, round, and reactive to light. No scleral icterus.  Neck: Normal range of motion. Neck supple.  Cardiovascular: Normal rate, regular rhythm and normal heart sounds.   No murmur heard. Pulmonary/Chest: No stridor. No respiratory distress. She has wheezes.  Abdominal: Soft. Bowel sounds are normal. She exhibits no distension. There is no tenderness. There is no rebound.  Musculoskeletal: Normal range of motion. She exhibits edema.  Neurological: She is alert and oriented to person, place, and time. She displays normal reflexes. Coordination normal.  Skin: Skin is warm. She is not diaphoretic.  Psychiatric: She has a normal mood and affect.        LABS    Recent Labs     11/03/14  0352  11/04/14  0458  11/05/14  0342  HGB  12.4   --   11.8*  HCT  39.0   --   36.9  MCV  90.4   --   89.5  WBC  14.2*   --   10.3  BUN  21*  25*  21*  CREATININE  1.00  0.88  0.73  GLUCOSE  173*  122*  117*  CALCIUM  8.8*  8.3*  8.0*  ,    No results for input(s): PH in the last 72 hours.  Invalid input(s): PCO2, PO2, BASEEXCESS, BASEDEFICITE, TFT    CULTURE RESULTS   Recent Results (from the past 240 hour(s))  MRSA PCR Screening     Status: None   Collection Time: 11/03/14  9:41 AM  Result Value Ref Range Status   MRSA by PCR NEGATIVE NEGATIVE Final    Comment:        The GeneXpert MRSA Assay (FDA approved for NASAL specimens only), is one component of a comprehensive MRSA colonization surveillance program. It is not intended to diagnose MRSA infection nor to guide or monitor treatment for MRSA infections.           IMAGING    Dg Chest Port 1 View  11/05/2014   CLINICAL DATA:  Pulmonary edema.  EXAM: PORTABLE CHEST - 1  VIEW  COMPARISON:  11/03/2014  FINDINGS: Persistent elevation of the right hemidiaphragm. Vascular crowding in the right hilum. Heart size  is upper limits of normal. The left lung base is incompletely imaged and difficult to exclude consolidation or opacification in this region. There continues to be airspace disease in the left lower lung. Improved aeration in the right lung.  IMPRESSION: Persistent airspace disease in the left lower lung.  Slightly improved aeration in the right lung.   Electronically Signed   By: Richarda Overlie M.D.   On: 11/05/2014 07:28        ASSESSMENT/PLAN   69 year old female with history of multiple medical problems  status brought in with complains of acute respiratory distress with hypoxia.  Likely cause is acute atypical  pneumonia with COPD exacerbation with acute Heart dysfunction-with acute Afib with RVR   1.oxygen and biPAP as needed 2.continue abx as prescribed 3.continue iv abx , will add iv steroids 4. Albuterol nebs and pulmicort nebs 5.add dulera 6.will consider step down status and transfer to floor  I have personally obtained a history, examined the patient, evaluated laboratory and independently reviewed imaging results, formulated the assessment and plan and placed orders.  The Patient requires high complexity decision making for assessment and support, frequent evaluation and titration of therapies, application of advanced monitoring technologies and extensive interpretation of multiple databases. Time spent with patient 55 minutes.  Patient is satisfied with Plan of action and management.    Lucie Leather, M.D.  [facility]  Medical Director [facility] Medical Director [facility] Cardio-Pulmonary Department

## 2014-11-05 NOTE — Clinical Documentation Improvement (Signed)
Hospitalist  Can the diagnosis of CHF be further specified?    Acuity - Acute, Chronic, Acute on Chronic  Other  Clinically Undetermined   Document any associated diagnoses/conditions   Supporting Information: States in record that pt has has acute pulmonary edema and chronic diastolic CHF.    Please exercise your independent, professional judgment when responding. A specific answer is not anticipated or expected.   Thank You,  Carlyn Reichert [facility] Health Information Management [place]

## 2014-11-05 NOTE — Progress Notes (Addendum)
Physical Therapy Treatment Patient Details Name: Eileen [name] MRN: [id number] DOB: 1945-05-27 Today's Date: 11/05/2014    History of Present Illness presented to ER secondary to respiratory distress, hypoxia; admitted with acute respiratory failure secondary to pulmonary edema, possible PNA.  Hospital course also significant for Afib with RVR (rates now in 70-80s).  Currently on 5L O2 via [place].    PT Comments    Patient with limited participation with session this date; agreeable to supine UE/LE therex with mod encouragement from therapist.  Refused participation with additional mobility efforts, though noted to require dep assist +2 for any/all repositioning on bed surface. Vitals stable and WFL throughout session. Will continue with skilled PT as appropriate, but will closely monitor participation/progress, as patient may be near baseline level of function (known from previous admission).   Follow Up Recommendations  SNF     Equipment Recommendations       Recommendations for Other Services       Precautions / Restrictions Precautions Precautions: Fall Precaution Comments: contact isolation Restrictions Weight Bearing Restrictions: No    Mobility  Bed Mobility Overal bed mobility: +2 for physical assistance             General bed mobility comments: dep for scooting up, repositioning in bed  Transfers                 General transfer comment: unsafe/unable to tolerate this date.  Does report sitting on edge of bed "with assistance" at [facility] for up to 10 min at a time prior to re-admission.  Ambulation/Gait             General Gait Details: non-ambulatory at baseline   Stairs            Wheelchair Mobility    Modified Rankin (Stroke Patients Only)       Balance                                    Cognition                            Exercises Other Exercises Other Exercises: Bilat UE/LE active ROM: ankle  pumps, quad sets, glut sets, hip abduct/adduct; self-assisted bilat shoulder flex/ext, rowing, and bicep flex/ext, 1x20 each. Other Exercises: Bilat UE strength and ROM grossly WFL (R shoulder elevation limited to shoulder height due to previous injury). Bilat knee ROM significantly limited (L knee fused, R knee with previous injury, rests in position of hyperextension), but hips and ankles grossly WFL. Other Exercises: Patient refused additional mobility attempts (i.e., rolling, unsupported sitting) this date.    General Comments        Pertinent Vitals/Pain Pain Assessment: No/denies pain    Home Living                      Prior Function            PT Goals (current goals can now be found in the care plan section) Acute Rehab PT Goals Patient Stated Goal: states she'd like to return to some mobility even if it's wheel chair related PT Goal Formulation: With patient Time For Goal Achievement: 11/17/14 Potential to Achieve Goals: Good Progress towards PT goals: Progressing toward goals (limited progress due to refusal at additional mobility efforts)    Frequency  Min 2X/week  PT Plan Current plan remains appropriate    Co-evaluation             End of Session   Activity Tolerance: Patient tolerated treatment well (increased encouragement for participation) Patient left: in bed;with bed alarm set     Time: 4098-1191 PT Time Calculation (min) (ACUTE ONLY): 19 min  Charges:  $Therapeutic Exercise: 8-22 mins                    G Codes:      Jesenia Spera H. Manson Passey, PT, DPT, NCS 11/05/2014, 11:23 AM [phone]  Addendum: time corrected to reflect accurate time.   Drago Hammonds H. Manson Passey, PT, DPT, NCS 11/05/2014, 11:23 AM [phone]

## 2014-11-05 NOTE — Progress Notes (Signed)
Pharmacy electrolyte monitoring.  Potassium level 2.1. KCl 10 mEq/hr x 4 doses ordered. 20 mEq PO bid x4 doses ordered also. K+ and Mg labs ordered after completion of K+ runs to evaluate progress.  8/28:  K @ 11:40 = 2.6 KCl 20 mEq PO BID ordered. Will order additional KCl 10 mEq X 4 .  Will recheck K on 8/29 with AM labs.   8/29 AM K+ 2.8. KCl 10 mEq x 4 doses ordered. 20 mEq PO x 2 doses still to be given. Recheck BMP in AM.  8/29 PM K = 3.7 after KCl 10 mEq IV x4 and KCl 20 mEq PO x1 today so far. K level now WNL; will continue current order for KCL 20 mEq PO x1 dose still left to give tonight. F/u AM labs.     Crist Fat, PharmD, BCPS Clinical Pharmacist 11/05/2014 9:06 PM

## 2014-11-05 NOTE — Progress Notes (Signed)
SUBJECTIVE: Pt less SOB, but now with productive cough. No CP   Filed Vitals:   11/05/14 0800 11/05/14 0829 11/05/14 0856 11/05/14 0900  BP: 88/67  88/57 84/55  Pulse: 72   85  Temp:  98.1 F (36.7 C)    TempSrc:  Oral    Resp:    35  Height:      Weight:      SpO2: 95% 96%  93%    Intake/Output Summary (Last 24 hours) at 11/05/14 0952 Last data filed at 11/05/14 0848  Gross per 24 hour  Intake    320 ml  Output   1675 ml  Net  -1355 ml    LABS: Basic Metabolic Panel:  Recent Labs  16/10/96 0458 11/04/14 1141 11/04/14 1855 11/05/14 0342  NA 135  --   --  132*  K 2.1* 2.6* 3.0* 2.8*  CL 86*  --   --  85*  CO2 35*  --   --  34*  GLUCOSE 122*  --   --  117*  BUN 25*  --   --  21*  CREATININE 0.88  --   --  0.73  CALCIUM 8.3*  --   --  8.0*  MG  --  1.4*  --  1.9  PHOS  --   --   --  2.6   Liver Function Tests:  Recent Labs  11/03/14 0352  AST 21  ALT 11*  ALKPHOS 128*  BILITOT 0.6  PROT 6.5  ALBUMIN 2.6*   No results for input(s): LIPASE, AMYLASE in the last 72 hours. CBC:  Recent Labs  11/03/14 0352 11/05/14 0342  WBC 14.2* 10.3  HGB 12.4 11.8*  HCT 39.0 36.9  MCV 90.4 89.5  PLT 157 144*   Cardiac Enzymes:  Recent Labs  11/03/14 0352 11/03/14 0915 11/03/14 1503  TROPONINI 0.48* 0.04* 0.05*   BNP: Invalid input(s): POCBNP D-Dimer: No results for input(s): DDIMER in the last 72 hours. Hemoglobin A1C: No results for input(s): HGBA1C in the last 72 hours. Fasting Lipid Panel: No results for input(s): CHOL, HDL, LDLCALC, TRIG, CHOLHDL, LDLDIRECT in the last 72 hours. Thyroid Function Tests: No results for input(s): TSH, T4TOTAL, T3FREE, THYROIDAB in the last 72 hours.  Invalid input(s): FREET3 Anemia Panel: No results for input(s): VITAMINB12, FOLATE, FERRITIN, TIBC, IRON, RETICCTPCT in the last 72 hours.   PHYSICAL EXAM General: obese, chronically ill appearing Neck:  No JVD.  Lungs: rhonchi b/l Heart: irregularly  irregular Abdomen: Bowel sounds are positive, abdomen soft and non-tender  Msk:  Back normal, normal gait. Normal strength and tone for age. Extremities: trace pedal edema b/l Neuro: Alert and oriented X 3. Psych:  Good affect, responds appropriately  TELEMETRY: Reviewed telemetry pt in a-fib  ASSESSMENT AND PLAN: acute respiratory distress 2/2 pulmonary edema vs PNA. Echo shows EF 40%, grade 1 diastolic dysfunction, mild MR, mild pulmonary HTN. Advise decreasing carvedilol to 6.25mg  BID, continue cardizem PO (  TID), spironolactone for diuresis since lasix was held 2/2 hypokalemia. Spoke with pts husband regarding cardiac issues and urged him to have her f/u as outpatient.   -A-fib now rate controlled and protected with eliquis. Urged pt to wear CPAP once back at rehab facility as many of these cardio-pulmonary issues are likely 2/2 non-compliance with CPAP.   Patient and plan discussed with supervising provider, Dr. Adrian Blackwater, who agrees with above findings.   Alinda Sierras Margarito Courser [facility]  11/05/2014 9:52 AM

## 2014-11-05 NOTE — Progress Notes (Signed)
Pharmacy electrolyte monitoring.  Potassium level 2.1. KCl 10 mEq/hr x 4 doses ordered. 20 mEq PO bid x4 doses ordered also. K+ and Mg labs ordered after completion of K+ runs to evaluate progress.  8/28:  K @ 11:40 = 2.6 KCl 20 mEq PO BID ordered. Will order additional KCl 10 mEq X 4 .  Will recheck K on 8/29 with AM labs.   8/29 AM K+ 2.8. KCl 10 mEq x 4 doses ordered. 20 mEq PO x 2 doses still to be given. Recheck BMP in AM.  Princella Ion, PharmD 11/05/2014

## 2014-11-06 DIAGNOSIS — J189 Pneumonia, unspecified organism: Secondary | ICD-10-CM

## 2014-11-06 DIAGNOSIS — J9621 Acute and chronic respiratory failure with hypoxia: Secondary | ICD-10-CM

## 2014-11-06 DIAGNOSIS — J441 Chronic obstructive pulmonary disease with (acute) exacerbation: Secondary | ICD-10-CM

## 2014-11-06 LAB — BASIC METABOLIC PANEL
Anion gap: 10 (ref 5–15)
BUN: 26 mg/dL — AB (ref 6–20)
CALCIUM: 8.3 mg/dL — AB (ref 8.9–10.3)
CO2: 34 mmol/L — AB (ref 22–32)
CREATININE: 0.85 mg/dL (ref 0.44–1.00)
Chloride: 88 mmol/L — ABNORMAL LOW (ref 101–111)
GFR calc non Af Amer: >60 mL/min (ref >60)
GLUCOSE: 179 mg/dL — AB (ref 65–99)
Potassium: 3.9 mmol/L (ref 3.5–5.1)
Sodium: 132 mmol/L — ABNORMAL LOW (ref 135–145)

## 2014-11-06 LAB — CBC
HEMATOCRIT: 36.4 % (ref 35.0–47.0)
Hemoglobin: 11.5 g/dL — ABNORMAL LOW (ref 12.0–16.0)
MCH: 28.2 pg (ref 26.0–34.0)
MCHC: 31.6 g/dL — AB (ref 32.0–36.0)
MCV: 89.4 fL (ref 80.0–100.0)
Platelets: 135 10*3/uL — ABNORMAL LOW (ref 150–440)
RBC: 4.06 MIL/uL (ref 3.80–5.20)
RDW: 15.4 % — AB (ref 11.5–14.5)
WBC: 7.8 10*3/uL (ref 3.6–11.0)

## 2014-11-06 LAB — THEOPHYLLINE LEVEL: Theophylline Lvl: 12.6 (ref 8.0–20.0)

## 2014-11-06 LAB — GLUCOSE, CAPILLARY
GLUCOSE-CAPILLARY: 239 mg/dL — AB (ref 65–99)
Glucose-Capillary: 165 mg/dL — ABNORMAL HIGH (ref 65–99)
Glucose-Capillary: 185 mg/dL — ABNORMAL HIGH (ref 65–99)

## 2014-11-06 LAB — MAGNESIUM: Magnesium: 2 mg/dL (ref 1.7–2.4)

## 2014-11-06 LAB — PHOSPHORUS: PHOSPHORUS: 3.9 mg/dL (ref 2.5–4.6)

## 2014-11-06 MED ORDER — POTASSIUM CHLORIDE CRYS ER 20 MEQ PO TBCR
20.0000 meq | EXTENDED_RELEASE_TABLET | Freq: Two times a day (BID) | ORAL | Status: AC
  Administered 2014-11-06 (×2): 20 meq via ORAL
  Filled 2014-11-06 (×2): qty 1

## 2014-11-06 NOTE — Progress Notes (Signed)
[facility] - [place] at [facility]   PATIENT NAME: Eileen [name]    MR#:  [id number]  DATE OF BIRTH:  09/11/1945  SUBJECTIVE:   Breathing improving slowly, now has been on nasal cannula during the day and CPAP at night with oxygen saturations in the high 80s low 90s. Still with deep cough  REVIEW OF SYSTEMS:   Review of Systems  Constitutional: Negative for fever.  Respiratory: Negative for shortness of breath.   Cardiovascular: Negative for chest pain and palpitations.  Gastrointestinal: Negative for nausea, vomiting and abdominal pain.  Genitourinary: Negative for dysuria.   Tolerating Diet:no Tolerating PT: no  DRUG ALLERGIES:   Allergies  Allergen Reactions  . Other Other (See Comments)    darvocet--pt reports blistering with this medication  . Penicillins Nausea And Vomiting  . Darvon [Propoxyphene] Other (See Comments)    Blisters    VITALS:  Blood pressure 111/79, pulse 70, temperature 97.7 F (36.5 C), temperature source Axillary, resp. rate 29, height 4\' 11"  (1.499 m), weight 151.6 kg (334 lb 3.5 oz), SpO2 90 %.  PHYSICAL EXAMINATION:   Physical Exam Constitutional: She is oriented to person, place, and time. Obese  HENT:  Head: Normocephalic and atraumatic.  Right Ear: External ear normal.  Left Ear: External ear normal.  Mouth/Throat: No oropharyngeal exudate.  Eyes: EOM are normal. Pupils are equal, round, and reactive to light. No scleral icterus.  Neck: Normal range of motion. Neck supple. No JVD present. No thyromegaly present.  Cardiovascular: Normal rate, normal heart sounds and intact distal pulses. Exam reveals no friction rub.  No murmur heard. Irregularly irregular  Respiratory:  She has no wheezes. She has rales. On nasal cannula GI: Soft. Bowel sounds are normal. She exhibits no distension and no mass. There is no tenderness. There is no rebound and no guarding.  Musculoskeletal: Normal range of motion. She  exhibits no edema.  Lymphadenopathy:   She has no cervical adenopathy.  Neurological: unable to assess being on BIPAP Skin: Skin is warm. No rash noted. No erythema.  Psychiatric: lethargicl.   LABORATORY PANEL:   CBC  Recent Labs Lab 11/06/14 0421  WBC 7.8  HGB 11.5*  HCT 36.4  PLT 135*    Chemistries   Recent Labs Lab 11/03/14 0352  11/06/14 0421  NA 138  < > 132*  K 2.4*  < > 3.9  CL 92*  < > 88*  CO2 34*  < > 34*  GLUCOSE 173*  < > 179*  BUN 21*  < > 26*  CREATININE 1.00  < > 0.85  CALCIUM 8.8*  < > 8.3*  MG  --   < > 2.0  AST 21  --   --   ALT 11*  --   --   ALKPHOS 128*  --   --   BILITOT 0.6  --   --   < > = values in this interval not displayed.  Cardiac Enzymes  Recent Labs Lab 11/03/14 1503  TROPONINI 0.05*    RADIOLOGY:  Dg Chest Port 1 View  11/05/2014   CLINICAL DATA:  Pulmonary edema.  EXAM: PORTABLE CHEST - 1 VIEW  COMPARISON:  11/03/2014  FINDINGS: Persistent elevation of the right hemidiaphragm. Vascular crowding in the right hilum. Heart size is upper limits of normal. The left lung base is incompletely imaged and difficult to exclude consolidation or opacification in this region. There continues to be airspace disease in the left lower lung. Improved  aeration in the right lung.  IMPRESSION: Persistent airspace disease in the left lower lung.  Slightly improved aeration in the right lung.   Electronically Signed   By: Richarda Overlie M.D.   On: 11/05/2014 07:28     ASSESSMENT AND PLAN:   69 year old female with history of multiple medical problems including obstructive sleep apnea on CPAP, noncompliance to CPAP use, hypertension, diastolic congestive heart failure, atrial fibrillation on Eliquis, COPD, diabetes mellitus type 2, depression, on DO NOT RESUSCITATE status brought in with complains of acute respiratory distress with hypoxia.  1. Acute and chronic respiratory failure with hypoxia secondary to acute pulmonary edema. Underlying  pneumonia could not be ruled out. -Successful on nasal cannula yesterday, BiPAP at night, now back on nasal cannula  -continue IV Levaquin, has been afebrile, mild leukocytosis, no blood cultures were performed on admission - Appreciate pulmonology consultation, agree with adding IV steroid, dulera - We will be able to transfer to floor with BiPAP at night only  2. Acute pulmonary edema, history of congestive heart failure, recent echo  ejection fraction 40%. Grade 1 diastolic dysfunction -Continue IV lasix bid, net -4 L - Repeat chest x-ray with improved aeration right lung, persistent opacity in the left   3. Leukocytosis with bilateral patchy airspace opacities.  - Possible pneumonia, continue Levaquin, sputum culture pending  4. Elevated troponin-demand ischemia versus non-STEMI. - Likely demand ischemia, troponin has decreased significantly with treatment of CHF - Appreciate cardiology consultation  5. Atrial fibrillation with rapid ventricle response, history of chronic atrial fibrillation on Eliquis. - continue BiPAP, IV furosemide, Nitropaste, cycle cardiac enzymes, low dose beta blocker.   6. Hypokalemia: Now stable -supplement potassium, follow-up BMP. - Agree with adding spironolactone  7. Obstructive sleep apnea, continue BiPAP.  8. COPD, stable. Continue home medications.  9. Diabetes mellitus type 2 on Lantus. Stable. Continue  SSI.  10. Hypertension, stable.  Case discussed with Care Management/Social Worker. Management plans discussed with the patient, no family is present  CODE STATUS: DNR  DVT Prophylaxis: eliquis  TOTAL criticalTIME TAKING CARE OF THIS PATIENT: 45 minutes.  >50% time spent on counselling and coordination of care    Elby Showers M.D on 11/06/2014 at 4:04 PM  Between 7am to 6pm - Pager - [phone]  After 6pm go to [email or web address] - password [email or web address] [facility]  [place] [facility]  Office  [phone]  CC: Primary care  physician; Laurier Nancy, MD

## 2014-11-06 NOTE — Progress Notes (Signed)
No new complaints No distress but desaturating on [place]  Rattling cough  Filed Vitals:   11/06/14 1200 11/06/14 1300 11/06/14 1400 11/06/14 1500  BP: 101/67 87/75 106/74 111/79  Pulse: 66 78 75 70  Temp: 97.7 F (36.5 C)     TempSrc: Axillary     Resp: Height:      Weight:      SpO2: 94% 87% 91% 90%   Obese, NAD HEENT WNL JVP cannot be visualized Scattered rhonchi, few wheezes IRIR, rate controlled, no M Obese, soft, +BS No edema Neuro grossly intact  BMP Latest Ref Rng 11/06/2014 11/05/2014 11/05/2014  Glucose 65 - 99 mg/dL 960(A) - 540(J)  BUN 6 - 20 mg/dL 81(X) - 91(Y)  Creatinine 0.44 - 1.00 mg/dL 7.82 - 9.56  Sodium 213 - 145 mmol/L 132(L) - 132(L)  Potassium 3.5 - 5.1 mmol/L 3.9 3.7 2.8(LL)  Chloride 101 - 111 mmol/L 88(L) - 85(L)  CO2 22 - 32 mmol/L 34(H) - 34(H)  Calcium 8.9 - 10.3 mg/dL 8.3(L) - 8.0(L)    CBC Latest Ref Rng 11/06/2014 11/05/2014 11/03/2014  WBC 3.6 - 11.0 K/uL 7.8 10.3 14.2(H)  Hemoglobin 12.0 - 16.0 g/dL 11.5(L) 11.8(L) 12.4  Hematocrit 35.0 - 47.0 % 36.4 36.9 39.0  Platelets 150 - 440 K/uL 135(L) 144(L) 157    No new CXR  IMPRESSION: Acute on chronic hypoxic respiratory failure COPD with resolving bronchospasm AF, rate controlled Probable PNA, NOS  PLAN/REC: Cont SDU status Cardiology following - mgmt AF per them Cont levofloxacin Cont nebulized BDs and steroids Cont systemic steroids - consider taper 8/31 Cont PRN and nocturnal NPPV DNR status appropriate Recheck CXR in AM 8/31  Billy Fischer, MD PCCM service Mobile [phone] Pager [phone]

## 2014-11-06 NOTE — Progress Notes (Signed)
   SUBJECTIVE: Pt continues to c/o productive cough. SOB stable, no CP.   Filed Vitals:   11/06/14 0550 11/06/14 0600 11/06/14 0800 11/06/14 0816  BP:  95/65    Pulse:  61    Temp:    98 F (36.7 C)  TempSrc:    Oral  Resp:  19    Height:      Weight: 151.6 kg (334 lb 3.5 oz)     SpO2:  94% 96% 96%    Intake/Output Summary (Last 24 hours) at 11/06/14 0907 Last data filed at 11/06/14 0551  Gross per 24 hour  Intake    400 ml  Output   1450 ml  Net  -1050 ml    LABS: Basic Metabolic Panel:  Recent Labs  09/81/19 0342 11/05/14 1918 11/06/14 0421  NA 132*  --  132*  K 2.8* 3.7 3.9  CL 85*  --  88*  CO2 34*  --  34*  GLUCOSE 117*  --  179*  BUN 21*  --  26*  CREATININE 0.73  --  0.85  CALCIUM 8.0*  --  8.3*  MG 1.9  --  2.0  PHOS 2.6  --  3.9   Liver Function Tests: No results for input(s): AST, ALT, ALKPHOS, BILITOT, PROT, ALBUMIN in the last 72 hours. No results for input(s): LIPASE, AMYLASE in the last 72 hours. CBC:  Recent Labs  11/05/14 0342 11/06/14 0421  WBC 10.3 7.8  HGB 11.8* 11.5*  HCT 36.9 36.4  MCV 89.5 89.4  PLT 144* 135*   Cardiac Enzymes:  Recent Labs  11/03/14 0915 11/03/14 1503  TROPONINI 0.04* 0.05*   BNP: Invalid input(s): POCBNP D-Dimer: No results for input(s): DDIMER in the last 72 hours. Hemoglobin A1C: No results for input(s): HGBA1C in the last 72 hours. Fasting Lipid Panel: No results for input(s): CHOL, HDL, LDLCALC, TRIG, CHOLHDL, LDLDIRECT in the last 72 hours. Thyroid Function Tests: No results for input(s): TSH, T4TOTAL, T3FREE, THYROIDAB in the last 72 hours.  Invalid input(s): FREET3 Anemia Panel: No results for input(s): VITAMINB12, FOLATE, FERRITIN, TIBC, IRON, RETICCTPCT in the last 72 hours.   PHYSICAL EXAM General: Well developed, well nourished, in no acute distress HEENT:  Normocephalic and atramatic Neck:  No JVD.  Lungs: Clear bilaterally to auscultation and percussion. Heart: HRRR . Normal  S1 and S2 without gallops or murmurs.  Abdomen: Bowel sounds are positive, abdomen soft and non-tender  Msk:  Back normal, normal gait. Normal strength and tone for age. Extremities: No clubbing, cyanosis or edema.   Neuro: Alert and oriented X 3. Psych:  Good affect, responds appropriately  TELEMETRY: Reviewed telemetry pt in a-fib VR in 70s  ASSESSMENT AND PLAN:  acute respiratory distress 2/2 pulmonary edema vs PNA. Echo shows EF 40%, grade 1 diastolic dysfunction, mild MR, mild pulmonary HTN. Advise continuing carvedilol 6.25mg  BID, cardizem  TID, and spironolactone for diuresis since lasix was held 2/2 hypokalemia. Spoke with pts husband regarding cardiac issues and urged him to have her f/u as outpatient.   -A-fib now rate controlled and protected with eliquis. Urged pt to wear CPAP once back at rehab facility as many of these cardio-pulmonary issues are likely 2/2 non-compliance with CPAP.   Patient and plan discussed with supervising provider, Dr. Adrian Blackwater, who agrees with above findings.   Eileen [name] [facility]  11/06/2014 9:07 AM

## 2014-11-06 NOTE — Progress Notes (Signed)
Pharmacy electrolyte monitoring.  Potassium level 2.1. KCl 10 mEq/hr x 4 doses ordered. 20 mEq PO bid x4 doses ordered also. K+ and Mg labs ordered after completion of K+ runs to evaluate progress.  8/28:  K @ 11:40 = 2.6 KCl 20 mEq PO BID ordered. Will order additional KCl 10 mEq X 4 .  Will recheck K on 8/29 with AM labs.   8/29 AM K+ 2.8. KCl 10 mEq x 4 doses ordered. 20 mEq PO x 2 doses still to be given. Recheck BMP in AM.  8/29 PM K = 3.7 after KCl 10 mEq IV x4 and KCl 20 mEq PO x1 today so far. K level now WNL; will continue current order for KCL 20 mEq PO x1 dose still left to give tonight. F/u AM labs.   0830 AM: K 3.9, phos 3.9, Mg 2 - WNL will not order additional supplement at this time.     Eileen [name], [place].D. Clinical Pharmacist 11/06/2014 6:07 AM

## 2014-11-06 NOTE — Progress Notes (Signed)
Pharmacy electrolyte monitoring.  Potassium level 2.1. KCl 10 mEq/hr x 4 doses ordered. 20 mEq PO bid x4 doses ordered also. K+ and Mg labs ordered after completion of K+ runs to evaluate progress.  8/28:  K @ 11:40 = 2.6 KCl 20 mEq PO BID ordered. Will order additional KCl 10 mEq X 4 .  Will recheck K on 8/29 with AM labs.   8/29 AM K+ 2.8. KCl 10 mEq x 4 doses ordered. 20 mEq PO x 2 doses still to be given. Recheck BMP in AM.  8/29 PM K = 3.7 after KCl 10 mEq IV x4 and KCl 20 mEq PO x1 today so far. K level now WNL; will continue current order for KCL 20 mEq PO x1 dose still left to give tonight. F/u AM labs.   0830 AM: K 3.9, phos 3.9, Mg 2 - WNL but patient still receiving IV diuretics. Will order potassium chloride 20 mEq orally twice daily x 2 doses and recheck electrolytes tomorrow morning. Na 132 will continue to monitor as fluid status improves.     Jakevious Hollister A. [place], [place].D. Clinical Pharmacist 11/06/2014 6:09 AM

## 2014-11-07 ENCOUNTER — Inpatient Hospital Stay: Payer: PPO

## 2014-11-07 LAB — PHOSPHORUS: PHOSPHORUS: 4 mg/dL (ref 2.5–4.6)

## 2014-11-07 LAB — GLUCOSE, CAPILLARY
GLUCOSE-CAPILLARY: 235 mg/dL — AB (ref 65–99)
GLUCOSE-CAPILLARY: 153 mg/dL — AB (ref 65–99)
Glucose-Capillary: 169 mg/dL — ABNORMAL HIGH (ref 65–99)
Glucose-Capillary: 179 mg/dL — ABNORMAL HIGH (ref 65–99)
Glucose-Capillary: 201 mg/dL — ABNORMAL HIGH (ref 65–99)

## 2014-11-07 LAB — BASIC METABOLIC PANEL
Anion gap: 8 (ref 5–15)
BUN: 36 mg/dL — ABNORMAL HIGH (ref 6–20)
CALCIUM: 8.5 mg/dL — AB (ref 8.9–10.3)
CO2: 36 mmol/L — ABNORMAL HIGH (ref 22–32)
CREATININE: 0.9 mg/dL (ref 0.44–1.00)
Chloride: 87 mmol/L — ABNORMAL LOW (ref 101–111)
GFR calc non Af Amer: >60 mL/min (ref >60)
Glucose, Bld: 185 mg/dL — ABNORMAL HIGH (ref 65–99)
Potassium: 3.5 mmol/L (ref 3.5–5.1)
SODIUM: 131 mmol/L — AB (ref 135–145)

## 2014-11-07 LAB — MAGNESIUM: MAGNESIUM: 1.9 mg/dL (ref 1.7–2.4)

## 2014-11-07 MED ORDER — METHYLPREDNISOLONE SODIUM SUCC 40 MG IJ SOLR: 40.0000 mg | Freq: Two times a day (BID) | INTRAMUSCULAR | Status: DC

## 2014-11-07 MED ORDER — PREDNISONE 50 MG PO TABS
50.0000 mg | ORAL_TABLET | Freq: Every day | ORAL | Status: DC
  Administered 2014-11-08 – 2014-11-10 (×3): 50 mg via ORAL
  Filled 2014-11-07 (×3): qty 1

## 2014-11-07 MED ORDER — POTASSIUM CHLORIDE CRYS ER 20 MEQ PO TBCR: 20.0000 meq | EXTENDED_RELEASE_TABLET | Freq: Three times a day (TID) | ORAL | Status: DC

## 2014-11-07 MED ORDER — POTASSIUM CHLORIDE CRYS ER 20 MEQ PO TBCR
20.0000 meq | EXTENDED_RELEASE_TABLET | Freq: Three times a day (TID) | ORAL | Status: AC
  Administered 2014-11-07 (×3): 20 meq via ORAL
  Filled 2014-11-07 (×3): qty 1

## 2014-11-07 NOTE — Progress Notes (Signed)
   SUBJECTIVE: Pt still c/o productive cough and mild SOB. No CP   Filed Vitals:   11/07/14 0800 11/07/14 0820 11/07/14 0900 11/07/14 0914  BP: 99/63  111/65 111/65  Pulse: 54  57 62  Temp:      TempSrc:      Resp: 19  22   Height:      Weight:      SpO2: 94% 93% 93%     Intake/Output Summary (Last 24 hours) at 11/07/14 1030 Last data filed at 11/07/14 0730  Gross per 24 hour  Intake    480 ml  Output   1300 ml  Net   -820 ml    LABS: Basic Metabolic Panel:  Recent Labs  30/16/01 0421 11/07/14 0457  NA 132* 131*  K 3.9 3.5  CL 88* 87*  CO2 34* 36*  GLUCOSE 179* 185*  BUN 26* 36*  CREATININE 0.85 0.90  CALCIUM 8.3* 8.5*  MG 2.0 1.9  PHOS 3.9 4.0   Liver Function Tests: No results for input(s): AST, ALT, ALKPHOS, BILITOT, PROT, ALBUMIN in the last 72 hours. No results for input(s): LIPASE, AMYLASE in the last 72 hours. CBC:  Recent Labs  11/05/14 0342 11/06/14 0421  WBC 10.3 7.8  HGB 11.8* 11.5*  HCT 36.9 36.4  MCV 89.5 89.4  PLT 144* 135*   Cardiac Enzymes: No results for input(s): CKTOTAL, CKMB, CKMBINDEX, TROPONINI in the last 72 hours. BNP: Invalid input(s): POCBNP D-Dimer: No results for input(s): DDIMER in the last 72 hours. Hemoglobin A1C: No results for input(s): HGBA1C in the last 72 hours. Fasting Lipid Panel: No results for input(s): CHOL, HDL, LDLCALC, TRIG, CHOLHDL, LDLDIRECT in the last 72 hours. Thyroid Function Tests: No results for input(s): TSH, T4TOTAL, T3FREE, THYROIDAB in the last 72 hours.  Invalid input(s): FREET3 Anemia Panel: No results for input(s): VITAMINB12, FOLATE, FERRITIN, TIBC, IRON, RETICCTPCT in the last 72 hours.   PHYSICAL EXAM General: obese, critically ill appearing HEENT:  Normocephalic and atramatic Neck:  No JVD.  Lungs: rhochi b/l  Heart: irregularly irregular  Abdomen: Bowel sounds are positive, abdomen soft and non-tender  Msk:  Back normal, normal gait. Normal strength and tone for  age. Extremities: trace edema b/l.   Neuro: Alert and oriented X 3. Psych:  Good affect, responds appropriately  TELEMETRY: Reviewed telemetry pt in a-fib VR in 70s  ASSESSMENT AND PLAN: acute respiratory distress 2/2 pulmonary edema vs PNA. Echo shows EF 40%, grade 1 diastolic dysfunction, mild MR, mild pulmonary HTN. Advise continuing carvedilol 6.25mg  BID, cardizem  TID, and spironolactone for diuresis since lasix was held 2/2 hypokalemia. Spoke with pts husband regarding cardiac issues and urged him to have her f/u as outpatient.   -A-fib now rate controlled and protected with eliquis. Urged pt to wear CPAP once back at rehab facility as many of these cardio-pulmonary issues are likely 2/2 non-compliance with CPAP.   Patient and plan discussed with supervising provider, Dr. Adrian Blackwater, who agrees with above findings.   Eileen [name] Margarito [facility]  11/07/2014 10:30 AM

## 2014-11-07 NOTE — Progress Notes (Signed)
Pharmacy electrolyte monitoring.  Potassium level 2.1. KCl 10 mEq/hr x 4 doses ordered. 20 mEq PO bid x4 doses ordered also. K+ and Mg labs ordered after completion of K+ runs to evaluate progress.  8/28:  K @ 11:40 = 2.6 KCl 20 mEq PO BID ordered. Will order additional KCl 10 mEq X 4 .  Will recheck K on 8/29 with AM labs.   8/29 AM K+ 2.8. KCl 10 mEq x 4 doses ordered. 20 mEq PO x 2 doses still to be given. Recheck BMP in AM.  8/29 PM K = 3.7 after KCl 10 mEq IV x4 and KCl 20 mEq PO x1 today so far. K level now WNL; will continue current order for KCL 20 mEq PO x1 dose still left to give tonight. F/u AM labs.   0830 AM: K 3.9, phos 3.9, Mg 2 - WNL but patient still receiving IV diuretics. Will order potassium chloride 20 mEq orally twice daily x 2 doses and recheck electrolytes tomorrow morning. Na 132 will continue to monitor as fluid status improves.   0831 AM: K 3.5, phos 4, mg 1.9. Still on IV diuretics. Will order potassium chloride 20 mEq orally three times daily x 3 doses and recheck electrolytes tomorrow morning. Na 131, following.   [name] A. [place], [place].D. Clinical Pharmacist 11/07/2014 6:11 AM

## 2014-11-07 NOTE — Progress Notes (Signed)
Patient alert and oriented. Vital stable- afib controlled. Pt on Bipap at night at 40% FI02, O2 sat is 94%-No CO pain, SOB, reports cough that robitussin helps with. Output adequate, rested throughout shift.

## 2014-11-07 NOTE — Progress Notes (Signed)
Physical Therapy Treatment Patient Details Name: Eileen [name] MRN: [id number] DOB: 1945-04-01 Today's Date: 11/07/2014    History of Present Illness presented to ER secondary to respiratory distress, hypoxia; admitted with acute respiratory failure secondary to pulmonary edema, possible PNA.  Hospital course also significant for Afib with RVR (rates now in 70-80s).  Currently on 5L O2 via [place].    PT Comments    Limited progression noted as patient displays limited active effort/engagement with therapeutic interventions; requires constant encouragement/cuing for attention to and completion of desired tasks.  Self-limiting mobility progression, as patient consistently refusing attempts at rolling or transition to unsupported sitting to improve functional abilities.   Follow Up Recommendations  SNF     Equipment Recommendations       Recommendations for Other Services       Precautions / Restrictions Precautions Precautions: Fall Precaution Comments: contact isolation Restrictions Weight Bearing Restrictions: No    Mobility  Bed Mobility               General bed mobility comments: patient refused attempts at rolling or transition to unsupported sitting despite encouragement from therapist  Transfers                    Ambulation/Gait                 Stairs            Wheelchair Mobility    Modified Rankin (Stroke Patients Only)       Balance                                    Cognition                            Exercises Other Exercises Other Exercises: Bilat UE/LE active ROM: ankle pumps, quad sets, glut sets, hip abduct/adduct; self-assisted bilat shoulder flex/ext, bicep flex/ext, R UE D1 flexion to attempt progression towards rotation/rolling, 1x15 each.  Limited active effort with all therex; constant encouragement, cuing for attention to and completion of task. Other Exercises: Patient refused  additional mobility attempts (i.e., rolling, unsupported sitting) this date.    General Comments        Pertinent Vitals/Pain Pain Assessment: No/denies pain    Home Living                      Prior Function            PT Goals (current goals can now be found in the care plan section) Acute Rehab PT Goals Patient Stated Goal: states she'd like to return to some mobility even if it's wheel chair related PT Goal Formulation: With patient Time For Goal Achievement: 11/17/14 Potential to Achieve Goals: Fair Progress towards PT goals: Not progressing toward goals - comment (patient self-limiting, refusing participation with attempts at mobility progression)    Frequency  Min 2X/week    PT Plan Current plan remains appropriate    Co-evaluation             End of Session   Activity Tolerance:  (Participation self-limited by patient; refused participation with mobility progression) Patient left: in bed;with bed alarm set     Time: 4098-1191 PT Time Calculation (min) (ACUTE ONLY): 11 min  Charges:  $Therapeutic Exercise: 8-22 mins  G Codes:      Aiken Withem H. Manson Passey, PT, DPT, NCS 11/07/2014, 8:58 AM [phone]

## 2014-11-07 NOTE — Progress Notes (Signed)
Inpatient Diabetes Program Recommendations  AACE/ADA: New Consensus Statement on Inpatient Glycemic Control (2013)  Target Ranges:  Prepandial:   less than 140 mg/dL      Peak postprandial:   less than 180 mg/dL (1-2 hours)      Critically ill patients:  140 - 180 mg/dL   Results for Eileen [name], Eileen [name] (MRN [id number]) as of 11/07/2014 09:16  Ref. Range 11/06/2014 07:43 11/06/2014 11:33 11/06/2014 16:05 11/07/2014 00:05 11/07/2014 08:42  Glucose-Capillary Latest Ref Range: 65-99 mg/dL 409 (H) 811 (H) 914 (H) 201 (H) 179 (H)    Diabetes history: DM2 Outpatient Diabetes medications: Lantus 15 units QHS, Novolog 0-20 units TID with meals, Novolog 0-5 units HS Current orders for Inpatient glycemic control: Lantus 15 units QHS, Novolog 0-15 units TID with meals  Inpatient Diabetes Program Recommendations Correction (SSI): Please consider ordering Novolog bedtime correction scale. Insulin - Meal Coverage: If steroids are continued as ordered and patient is eating at least 50% of meals, please consider ordering Novolog 4 units TID with meals for meal coverage (in addition to Novolog correction scale).  Thanks, Orlando Penner, RN, MSN, CCRN, CDE Diabetes Coordinator Inpatient Diabetes Program [phone] (Team Pager from 8am to 5pm) [phone] ([facility] office) [phone] [facility] office) [phone] [facility] office)

## 2014-11-07 NOTE — Progress Notes (Signed)
Continued slow improvement No new complaints No distress   Rattling cough persits but improved  Filed Vitals:   11/07/14 1100 11/07/14 1200 11/07/14 1300 11/07/14 1400  BP: 84/70 104/80  127/78  Pulse: 68 62 74 74  Temp:      TempSrc:      Resp: Height:      Weight:      SpO2: 93% 95% 91% 92%   Obese, NAD HEENT WNL JVP cannot be visualized Scattered rhonchi, few scattered wheezes IRIR, rate controlled, no M Obese, soft, +BS No edema Neuro grossly intact  BMP Latest Ref Rng 11/07/2014 11/06/2014 11/05/2014  Glucose 65 - 99 mg/dL 914(N) 829(F) -  BUN 6 - 20 mg/dL 62(Z) 30(Q) -  Creatinine 0.44 - 1.00 mg/dL 6.57 8.46 -  Sodium 962 - 145 mmol/L 131(L) 132(L) -  Potassium 3.5 - 5.1 mmol/L 3.5 3.9 3.7  Chloride 101 - 111 mmol/L 87(L) 88(L) -  CO2 22 - 32 mmol/L 36(H) 34(H) -  Calcium 8.9 - 10.3 mg/dL 9.5(M) 8.3(L) -    CBC Latest Ref Rng 11/06/2014 11/05/2014 11/03/2014  WBC 3.6 - 11.0 K/uL 7.8 10.3 14.2(H)  Hemoglobin 12.0 - 16.0 g/dL 11.5(L) 11.8(L) 12.4  Hematocrit 35.0 - 47.0 % 36.4 36.9 39.0  Platelets 150 - 440 K/uL 135(L) 144(L) 157    LLL atx vs PNA  IMPRESSION: Acute on chronic hypoxic respiratory failure COPD with resolving bronchospasm Probable PNA, NOS AF, rate well controlled  PLAN/REC: Cont levofloxacin - complete 10 days total Cont nebulized BDs and steroids Cont systemic steroids - dose reduced 8/31. Likely transition to pred taper 9/01 Cont nocturnal NPPV OK to transfer to telemetry from PCCM perspective  Billy Fischer, MD PCCM service Mobile [phone] Pager [phone]

## 2014-11-07 NOTE — Progress Notes (Signed)
[facility] - [place] at [facility]   PATIENT NAME: Eileen [name]    MR#:  [id number]  DATE OF BIRTH:  06-12-1945  SUBJECTIVE:   BiPAP at night, nasal cannula 5 L during the day. Still with cough productive of thick sputum.  REVIEW OF SYSTEMS:   Review of Systems  Constitutional: Negative for fever.  Respiratory: Positive for cough, sputum production, shortness of breath and wheezing.   Cardiovascular: Negative for chest pain and palpitations.  Gastrointestinal: Negative for nausea, vomiting and abdominal pain.  Genitourinary: Negative for dysuria.    DRUG ALLERGIES:   Allergies  Allergen Reactions  . Other Other (See Comments)    darvocet--pt reports blistering with this medication  . Penicillins Nausea And Vomiting  . Darvon [Propoxyphene] Other (See Comments)    Blisters    VITALS:  Blood pressure 99/80, pulse 75, temperature 98.4 F (36.9 C), temperature source Oral, resp. rate 20, height 4\' 11"  (1.499 m), weight 152.8 kg (336 lb 13.8 oz), SpO2 93 %.  PHYSICAL EXAMINATION:   Physical Exam Constitutional: She is oriented to person, place, and time. Obese  Cardiovascular: Distant irregular, no murmurs rubs or gallops  Respiratory:  She has no wheezes. She has rales. On nasal cannula, thick cough GI: Soft. Bowel sounds are normal. She exhibits no distension and no mass. There is no tenderness. There is no rebound and no guarding.  Musculoskeletal: Normal range of motion. She exhibits no edema.  Lymphadenopathy:  She has no cervical adenopathy.  Neurological: Alert, oriented, no focal defects Skin: Skin is warm. No rash noted. No erythema.   LABORATORY PANEL:   CBC  Recent Labs Lab 11/06/14 0421  WBC 7.8  HGB 11.5*  HCT 36.4  PLT 135*    Chemistries   Recent Labs Lab 11/03/14 0352  11/07/14 0457  NA 138  < > 131*  K 2.4*  < > 3.5  CL 92*  < > 87*  CO2 34*  < > 36*  GLUCOSE 173*  < > 185*  BUN 21*  < > 36*  CREATININE  1.00  < > 0.90  CALCIUM 8.8*  < > 8.5*  MG  --   < > 1.9  AST 21  --   --   ALT 11*  --   --   ALKPHOS 128*  --   --   BILITOT 0.6  --   --   < > = values in this interval not displayed.  Cardiac Enzymes  Recent Labs Lab 11/03/14 1503  TROPONINI 0.05*    RADIOLOGY:  Dg Chest Port 1 View  11/07/2014   CLINICAL DATA:  Respiratory failure  EXAM: PORTABLE CHEST - 1 VIEW  COMPARISON:  November 05, 2014  FINDINGS: There is persistent consolidation in the left base region. Lungs elsewhere clear. There remains mild elevation of the right hemidiaphragm. Heart is upper normal in size with pulmonary vascularity within normal limits. No adenopathy. No pneumothorax. No bone lesions.  IMPRESSION: Persistent consolidation in the left base region. No new opacity. No change in cardiac silhouette.   Electronically Signed   By: Bretta Bang III M.D.   On: 11/07/2014 06:59     ASSESSMENT AND PLAN:   69 year old female with history of multiple medical problems including obstructive sleep apnea on CPAP, noncompliance to CPAP use, morbid obesity, hypertension, diastolic congestive heart failure, atrial fibrillation on Eliquis, COPD, diabetes mellitus type 2, depression, on DO NOT RESUSCITATE status brought in with complains of acute  respiratory distress with hypoxia.  1. Acute and chronic respiratory failure with hypoxia secondary to acute pulmonary edema. Underlying pneumonia could not be ruled out. -BiPAP at night, nasal cannula during the day of 5 L  -continue Levaquin to complete 10 day course stop date will be 9/6  2. Acute pulmonary edema, history of congestive heart failure, recent echo  ejection fraction 40%. Grade 1 diastolic dysfunction -Continue IV lasix bid - Repeat chest x-ray with improved aeration right lung, persistent opacity in the left   3. Leukocytosis with bilateral patchy airspace opacities.  - Possible pneumonia, continue Levaquin, sputum culture pending  4. Elevated  troponin-demand ischemia versus non-STEMI. - Likely demand ischemia, troponin has decreased significantly with treatment of CHF - Appreciate cardiology consultation  5. Atrial fibrillation with rapid ventricle response, history of chronic atrial fibrillation on Eliquis. - continue BiPAP, IV furosemide, Nitropaste, cycle cardiac enzymes, low dose beta blocker.   6. Hypokalemia: Now stable -supplement potassium, follow-up BMP. - Agree with adding spironolactone  7. Obstructive sleep apnea, continue BiPAP.  8. COPD, stable. Continue home medications.  9. Diabetes mellitus type 2 on Lantus. Stable. Continue  SSI.  10. Hypertension, stable.  Case discussed with Care Management/Social Worker. Management plans discussed with the patient, husband, social work. This patient may be a good LTAC candidate as she remains on BiPAP at night.  CODE STATUS: DNR  DVT Prophylaxis: eliquis  TOTAL criticalTIME TAKING CARE OF THIS PATIENT: 45 minutes.  >50% time spent on counselling and coordination of care    Elby Showers M.D on 11/07/2014 at 4:32 PM  Between 7am to 6pm - Pager - [phone]  After 6pm go to [email or web address] - password [email or web address] [facility]  [facility]  Office  [phone]  CC: Primary care physician; Laurier Nancy, MD

## 2014-11-08 LAB — BASIC METABOLIC PANEL
ANION GAP: 10 (ref 5–15)
BUN: 43 mg/dL — ABNORMAL HIGH (ref 6–20)
CHLORIDE: 88 mmol/L — AB (ref 101–111)
CO2: 34 mmol/L — AB (ref 22–32)
Calcium: 8.4 mg/dL — ABNORMAL LOW (ref 8.9–10.3)
Creatinine, Ser: 1.02 mg/dL — ABNORMAL HIGH (ref 0.44–1.00)
GFR calc non Af Amer: 54 mL/min — ABNORMAL LOW (ref >60)
Glucose, Bld: 196 mg/dL — ABNORMAL HIGH (ref 65–99)
POTASSIUM: 4.2 mmol/L (ref 3.5–5.1)
SODIUM: 132 mmol/L — AB (ref 135–145)

## 2014-11-08 LAB — GLUCOSE, CAPILLARY
GLUCOSE-CAPILLARY: 160 mg/dL — AB (ref 65–99)
GLUCOSE-CAPILLARY: 169 mg/dL — AB (ref 65–99)
GLUCOSE-CAPILLARY: 214 mg/dL — AB (ref 65–99)
Glucose-Capillary: 210 mg/dL — ABNORMAL HIGH (ref 65–99)

## 2014-11-08 MED ORDER — POTASSIUM CHLORIDE CRYS ER 20 MEQ PO TBCR
20.0000 meq | EXTENDED_RELEASE_TABLET | Freq: Two times a day (BID) | ORAL | Status: AC
  Administered 2014-11-08 (×2): 20 meq via ORAL
  Filled 2014-11-08 (×2): qty 1

## 2014-11-08 MED ORDER — INSULIN ASPART 100 UNIT/ML ~~LOC~~ SOLN
0.0000 [IU] | Freq: Three times a day (TID) | SUBCUTANEOUS | Status: DC
  Administered 2014-11-08 (×2): 5 [IU] via SUBCUTANEOUS
  Administered 2014-11-09: 3 [IU] via SUBCUTANEOUS
  Administered 2014-11-09: 2 [IU] via SUBCUTANEOUS
  Administered 2014-11-09: 5 [IU] via SUBCUTANEOUS
  Administered 2014-11-09: 8 [IU] via SUBCUTANEOUS
  Administered 2014-11-10 (×3): 3 [IU] via SUBCUTANEOUS
  Administered 2014-11-11: 2 [IU] via SUBCUTANEOUS
  Administered 2014-11-11: 3 [IU] via SUBCUTANEOUS
  Administered 2014-11-11 – 2014-11-12 (×4): 2 [IU] via SUBCUTANEOUS
  Administered 2014-11-13: 3 [IU] via SUBCUTANEOUS
  Filled 2014-11-08: qty 2
  Filled 2014-11-08: qty 3
  Filled 2014-11-08: qty 8
  Filled 2014-11-08: qty 3
  Filled 2014-11-08: qty 2
  Filled 2014-11-08: qty 3
  Filled 2014-11-08: qty 5
  Filled 2014-11-08: qty 3
  Filled 2014-11-08: qty 5
  Filled 2014-11-08 (×2): qty 3
  Filled 2014-11-08: qty 2
  Filled 2014-11-08: qty 5
  Filled 2014-11-08: qty 3
  Filled 2014-11-08 (×2): qty 2

## 2014-11-08 NOTE — Progress Notes (Signed)
Patient ID: Eileen [name], female   DOB: Sep 19, 1945, 69 y.o.   MRN: [id number] [facility] PROGRESS NOTE  PCP: Laurier Nancy, MD  HPI/Subjective: Patient still with some shortness of breath and wheeze. Normally she wears 4 L of oxygen at home. She has not walked in a few weeks.  Objective: Filed Vitals:   11/08/14 1326  BP: 105/61  Pulse: 68  Temp:   Resp:     Filed Weights   11/06/14 0550 11/07/14 0500 11/08/14 0511  Weight: 151.6 kg (334 lb 3.5 oz) 152.8 kg (336 lb 13.8 oz) 149.732 kg (330 lb 1.6 oz)    ROS: Review of Systems  Constitutional: Positive for malaise/fatigue. Negative for fever and chills.  Eyes: Negative for blurred vision.  Respiratory: Negative for cough and shortness of breath.   Cardiovascular: Negative for chest pain.  Gastrointestinal: Negative for nausea, vomiting, abdominal pain, diarrhea and constipation.  Genitourinary: Negative for dysuria.  Musculoskeletal: Negative for joint pain.  Neurological: Positive for weakness. Negative for dizziness and headaches.   Exam: Physical Exam  Constitutional: She is oriented to person, place, and time.  HENT:  Nose: No mucosal edema.  Mouth/Throat: No oropharyngeal exudate or posterior oropharyngeal edema.  Eyes: Conjunctivae, EOM and lids are normal. Pupils are equal, round, and reactive to light.  Neck: No JVD present. Carotid bruit is not present. No edema present. No thyroid mass and no thyromegaly present.  Cardiovascular: S1 normal and S2 normal.  Exam reveals no gallop.   Murmur heard.  Systolic murmur is present with a grade of 2/6  Pulses:      Dorsalis pedis pulses are 2+ on the right side, and 2+ on the left side.  Respiratory: No respiratory distress. She has decreased breath sounds in the right middle field, the right lower field, the left middle field and the left lower field. She has wheezes in the right lower field and the left lower field. She has no rhonchi. She has no  rales.  GI: Soft. Bowel sounds are normal. There is no tenderness.  Musculoskeletal:       Right ankle: She exhibits swelling.       Left ankle: She exhibits swelling.  Lymphadenopathy:    She has no cervical adenopathy.  Neurological: She is alert and oriented to person, place, and time. No cranial nerve deficit.  Skin: Skin is warm. Nails show no clubbing.  Chronic lower extremity discoloration bilaterally  Psychiatric: She has a normal mood and affect.    Data Reviewed: Basic Metabolic Panel:  Recent Labs Lab 11/04/14 0458 11/04/14 1141  11/05/14 0342 11/05/14 1918 11/06/14 0421 11/07/14 0457 11/08/14 0405  NA 135  --   --  132*  --  132* 131* 132*  K 2.1* 2.6*  < > 2.8* 3.7 3.9 3.5 4.2  CL 86*  --   --  85*  --  88* 87* 88*  CO2 35*  --   --  34*  --  34* 36* 34*  GLUCOSE 122*  --   --  117*  --  179* 185* 196*  BUN 25*  --   --  21*  --  26* 36* 43*  CREATININE 0.88  --   --  0.73  --  0.85 0.90 1.02*  CALCIUM 8.3*  --   --  8.0*  --  8.3* 8.5* 8.4*  MG  --  1.4*  --  1.9  --  2.0 1.9  --   PHOS  --   --   --  2.6  --  3.9 4.0  --   < > = values in this interval not displayed. Liver Function Tests:  Recent Labs Lab 11/03/14 0352  AST 21  ALT 11*  ALKPHOS 128*  BILITOT 0.6  PROT 6.5  ALBUMIN 2.6*   CBC:  Recent Labs Lab 11/03/14 0352 11/05/14 0342 11/06/14 0421  WBC 14.2* 10.3 7.8  HGB 12.4 11.8* 11.5*  HCT 39.0 36.9 36.4  MCV 90.4 89.5 89.4  PLT 157 144* 135*   Cardiac Enzymes:  Recent Labs Lab 11/03/14 0352 11/03/14 0915 11/03/14 1503  TROPONINI 0.48* 0.04* 0.05*   BNP (last 3 results)  Recent Labs  11/03/14 0352  BNP 447.0*   CBG:  Recent Labs Lab 11/07/14 1126 11/07/14 1621 11/07/14 1952 11/08/14 0725 11/08/14 1058  GLUCAP 235* 169* 153* 160* 169*    Recent Results (from the past 240 hour(s))  MRSA PCR Screening     Status: None   Collection Time: 11/03/14  9:41 AM  Result Value Ref Range Status   MRSA by PCR  NEGATIVE NEGATIVE Final    Comment:        The GeneXpert MRSA Assay (FDA approved for NASAL specimens only), is one component of a comprehensive MRSA colonization surveillance program. It is not intended to diagnose MRSA infection nor to guide or monitor treatment for MRSA infections.      Studies: Dg Chest Port 1 View  11/07/2014   CLINICAL DATA:  Respiratory failure  EXAM: PORTABLE CHEST - 1 VIEW  COMPARISON:  November 05, 2014  FINDINGS: There is persistent consolidation in the left base region. Lungs elsewhere clear. There remains mild elevation of the right hemidiaphragm. Heart is upper normal in size with pulmonary vascularity within normal limits. No adenopathy. No pneumothorax. No bone lesions.  IMPRESSION: Persistent consolidation in the left base region. No new opacity. No change in cardiac silhouette.   Electronically Signed   By: Bretta Bang III M.D.   On: 11/07/2014 06:59    Scheduled Meds: . acetaZOLAMIDE  250 mg Oral BID  . albuterol  2.5 mg Nebulization Q4H  . antiseptic oral rinse  7 mL Mouth Rinse q12n4p  . apixaban  5 mg Oral BID  . aspirin EC  81 mg Oral Daily  . budesonide (PULMICORT) nebulizer solution  0.5 mg Nebulization BID  . carvedilol  6.25 mg Oral BID WC  . chlorhexidine  15 mL Mouth Rinse BID  . diltiazem  60 mg Oral 3 times per day  . docusate sodium  200 mg Oral BID  . furosemide  40 mg Intravenous Q12H  . insulin aspart  0-15 Units Subcutaneous TID WC & HS  . insulin glargine  15 Units Subcutaneous QHS  . levofloxacin  750 mg Oral Daily  . mupirocin ointment   Nasal BID  . potassium chloride  20 mEq Oral BID WC  . predniSONE  50 mg Oral Q breakfast  . sertraline  100 mg Oral Daily  . sodium chloride  3 mL Intravenous Q12H  . spironolactone  50 mg Oral Daily  . theophylline  200 mg Oral Q12H  . tiotropium  18 mcg Inhalation Daily  . traZODone  100 mg Oral QHS  . trimethoprim-polymyxin b  2 drop Left Eye 6 times per day     Assessment/Plan: Principal Problem:   Acute on chronic respiratory failure with hypoxia Active Problems:   Acute pulmonary edema   Leucocytosis   Elevated troponin   Paroxysmal a-fib  OSA on CPAP   COPD (chronic obstructive pulmonary disease)   DM (diabetes mellitus)   HTN (hypertension)   Atrial fibrillation with rapid ventricular response   1. Acute on chronic respiratory failure with hypoxia. The patient was on 5 L when I walked and I dialed down to her normal 4 L of oxygen. Nursing staff will monitor pulse ox. 2. Acute on chronic combined systolic and diastolic congestive heart failure. Patient is on IV Lasix 40 mg IV twice a day. Also on spironolactone. Continue Coreg. Can consider ACE inhibitor. 3. Pneumonia and leukocytosis- continue Levaquin 4. COPD exacerbation with Wheezes in the lungs- on prednisone taper and budesonide nebulizers 5. Diabetes mellitus without complication- continue Lantus and sliding scale 6. Morbid obesity and sleep apnea- patient must lose weight. CPAP at night 7. Atrial fibrillation with rapid ventricular response on Coreg and diltiazem 8. Elevated troponin likely demand ischemia from rapid heart rate 9. Weakness- Awaiting physical therapy evaluation   Code Status:     Code Status Orders        Start     Ordered   2014/11/04 0853  Do not attempt resuscitation (DNR)   Continuous    Question Answer Comment  In the event of cardiac or respiratory ARREST Do not call a "code blue"   In the event of cardiac or respiratory ARREST Do not perform Intubation, CPR, defibrillation or ACLS   In the event of cardiac or respiratory ARREST Use medication by any route, position, wound care, and other measures to relive pain and suffering. May use oxygen, suction and manual treatment of airway obstruction as needed for comfort.   Comments RN may pronounce death      2014-11-04 1610    Advance Directive Documentation        Most Recent Value   Type of  Advance Directive  Out of facility DNR (pink MOST or yellow form)   Pre-existing out of facility DNR order (yellow form or pink MOST form)  Yellow form placed in chart (order not valid for inpatient use)   "MOST" Form in Place?       Disposition Plan: May need rehabilitation   Consultants:  Cardiology   Time spent: 30 minutes  Alford Highland  [facility]

## 2014-11-08 NOTE — Progress Notes (Signed)
Pharmacy electrolyte monitoring.  Potassium level 2.1. KCl 10 mEq/hr x 4 doses ordered. 20 mEq PO bid x4 doses ordered also. K+ and Mg labs ordered after completion of K+ runs to evaluate progress.  8/28:  K @ 11:40 = 2.6 KCl 20 mEq PO BID ordered. Will order additional KCl 10 mEq X 4 .  Will recheck K on 8/29 with AM labs.   8/29 AM K+ 2.8. KCl 10 mEq x 4 doses ordered. 20 mEq PO x 2 doses still to be given. Recheck BMP in AM.  8/29 PM K = 3.7 after KCl 10 mEq IV x4 and KCl 20 mEq PO x1 today so far. K level now WNL; will continue current order for KCL 20 mEq PO x1 dose still left to give tonight. F/u AM labs.   0830 AM: K 3.9, phos 3.9, Mg 2 - WNL but patient still receiving IV diuretics. Will order potassium chloride 20 mEq orally twice daily x 2 doses and recheck electrolytes tomorrow morning. Na 132 will continue to monitor as fluid status improves.   0831 AM: K 3.5, phos 4, mg 1.9. Still on IV diuretics. Will order potassium chloride 20 mEq orally three times daily x 3 doses and recheck electrolytes tomorrow morning. Na 131, following.   0901 AM: K 4.2 still on IV diuretics. Will order KCl 20 mEq PO BID x 2 doses and recheck BMP tomorrow AM.   Luz Brazen. [place], [place].D. Clinical Pharmacist 11/08/2014 7:10 AM

## 2014-11-08 NOTE — Progress Notes (Signed)
SUBJECTIVE: still c/o cough, no CP   Filed Vitals:   11/07/14 2036 11/07/14 2310 11/08/14 0450 11/08/14 0511  BP:    106/53  Pulse:    80  Temp:    97.9 F (36.6 C)  TempSrc:      Resp:    18  Height:      Weight:      SpO2: 94% 94% 95% 91%    Intake/Output Summary (Last 24 hours) at 11/08/14 0832 Last data filed at 11/08/14 0511  Gross per 24 hour  Intake      9 ml  Output   1125 ml  Net  -1116 ml    LABS: Basic Metabolic Panel:  Recent Labs  16/10/96 0421 11/07/14 0457 11/08/14 0405  NA 132* 131* 132*  K 3.9 3.5 4.2  CL 88* 87* 88*  CO2 34* 36* 34*  GLUCOSE 179* 185* 196*  BUN 26* 36* 43*  CREATININE 0.85 0.90 1.02*  CALCIUM 8.3* 8.5* 8.4*  MG 2.0 1.9  --   PHOS 3.9 4.0  --    Liver Function Tests: No results for input(s): AST, ALT, ALKPHOS, BILITOT, PROT, ALBUMIN in the last 72 hours. No results for input(s): LIPASE, AMYLASE in the last 72 hours. CBC:  Recent Labs  11/06/14 0421  WBC 7.8  HGB 11.5*  HCT 36.4  MCV 89.4  PLT 135*   Cardiac Enzymes: No results for input(s): CKTOTAL, CKMB, CKMBINDEX, TROPONINI in the last 72 hours. BNP: Invalid input(s): POCBNP D-Dimer: No results for input(s): DDIMER in the last 72 hours. Hemoglobin A1C: No results for input(s): HGBA1C in the last 72 hours. Fasting Lipid Panel: No results for input(s): CHOL, HDL, LDLCALC, TRIG, CHOLHDL, LDLDIRECT in the last 72 hours. Thyroid Function Tests: No results for input(s): TSH, T4TOTAL, T3FREE, THYROIDAB in the last 72 hours.  Invalid input(s): FREET3 Anemia Panel: No results for input(s): VITAMINB12, FOLATE, FERRITIN, TIBC, IRON, RETICCTPCT in the last 72 hours.   PHYSICAL EXAM General: obese, critically ill appearing HEENT: Normocephalic and atramatic Neck: No JVD.  Lungs: rhochi b/l  Heart: irregularly irregular  Abdomen: Bowel sounds are positive, abdomen soft and non-tender  Msk: Back normal, normal gait. Normal strength and tone for  age. Extremities: trace edema b/l.  Neuro: Alert and oriented X 3. Psych: Good affect, responds appropriately  TELEMETRY: Reviewed telemetry pt in a-fib VR in 70s  ASSESSMENT AND PLAN: acute respiratory distress 2/2 pulmonary edema vs PNA. Echo shows EF 40%, grade 1 diastolic dysfunction, mild MR, mild pulmonary HTN. Advise continuing carvedilol 6.25mg  BID, cardizem  TID, and spironolactone for diuresis since lasix was held 2/2 hypokalemia. Spoke with pts husband regarding cardiac issues and urged him to have her f/u as outpatient.   -A-fib now rate controlled and protected with eliquis. Urged pt to wear CPAP once back at rehab facility as many of these cardio-pulmonary issues are likely 2/2 non-compliance with CPAP.   Principal Problem:   Acute on chronic respiratory failure with hypoxia Active Problems:   Acute pulmonary edema   Leucocytosis   Elevated troponin   Paroxysmal a-fib   OSA on CPAP   COPD (chronic obstructive pulmonary disease)   DM (diabetes mellitus)   HTN (hypertension)   Atrial fibrillation with rapid ventricular response    Adrian Blackwater A, MD, [facility] 11/08/2014 8:32 AM

## 2014-11-08 NOTE — Progress Notes (Signed)
MD, Dr. Anne Hahn notified due to low BP, cardizem 60 mg ordered.  MD ordered to hold medication. Eileen [name]

## 2014-11-08 NOTE — Progress Notes (Signed)
Unable to visualize pt's backside because pt is unable to turn from side to side.

## 2014-11-08 NOTE — Care Management Important Message (Signed)
Important Message  Patient Details  Name: Eileen [name] MRN: [id number] Date of Birth: 11-05-1945   Medicare Important Message Given:  Yes-third notification given    Olegario Messier A Allmond 11/08/2014, 1:28 PM

## 2014-11-09 DIAGNOSIS — J811 Chronic pulmonary edema: Secondary | ICD-10-CM | POA: Insufficient documentation

## 2014-11-09 DIAGNOSIS — I10 Essential (primary) hypertension: Secondary | ICD-10-CM

## 2014-11-09 DIAGNOSIS — R7989 Other specified abnormal findings of blood chemistry: Secondary | ICD-10-CM

## 2014-11-09 DIAGNOSIS — J810 Acute pulmonary edema: Secondary | ICD-10-CM

## 2014-11-09 DIAGNOSIS — J9690 Respiratory failure, unspecified, unspecified whether with hypoxia or hypercapnia: Secondary | ICD-10-CM | POA: Insufficient documentation

## 2014-11-09 DIAGNOSIS — G4733 Obstructive sleep apnea (adult) (pediatric): Secondary | ICD-10-CM

## 2014-11-09 DIAGNOSIS — I509 Heart failure, unspecified: Secondary | ICD-10-CM | POA: Insufficient documentation

## 2014-11-09 LAB — BASIC METABOLIC PANEL
Anion gap: 8 (ref 5–15)
BUN: 43 mg/dL — AB (ref 6–20)
CALCIUM: 8.5 mg/dL — AB (ref 8.9–10.3)
CO2: 36 mmol/L — ABNORMAL HIGH (ref 22–32)
CREATININE: 0.98 mg/dL (ref 0.44–1.00)
Chloride: 89 mmol/L — ABNORMAL LOW (ref 101–111)
GFR calc Af Amer: >60 mL/min (ref >60)
GFR, EST NON AFRICAN AMERICAN: 57 mL/min — AB (ref >60)
Glucose, Bld: 149 mg/dL — ABNORMAL HIGH (ref 65–99)
Potassium: 4 mmol/L (ref 3.5–5.1)
SODIUM: 133 mmol/L — AB (ref 135–145)

## 2014-11-09 LAB — GLUCOSE, CAPILLARY
GLUCOSE-CAPILLARY: 129 mg/dL — AB (ref 65–99)
GLUCOSE-CAPILLARY: 210 mg/dL — AB (ref 65–99)
Glucose-Capillary: 174 mg/dL — ABNORMAL HIGH (ref 65–99)
Glucose-Capillary: 253 mg/dL — ABNORMAL HIGH (ref 65–99)

## 2014-11-09 MED ORDER — SULFAMETHOXAZOLE-TRIMETHOPRIM 200-40 MG/5ML PO SUSP
20.0000 mL | Freq: Two times a day (BID) | ORAL | Status: DC
  Administered 2014-11-09 – 2014-11-13 (×9): 20 mL via ORAL
  Filled 2014-11-09 (×3): qty 20
  Filled 2014-11-09: qty 40
  Filled 2014-11-09 (×8): qty 20

## 2014-11-09 MED ORDER — LISINOPRIL 5 MG PO TABS
2.5000 mg | ORAL_TABLET | Freq: Every day | ORAL | Status: DC
  Filled 2014-11-09: qty 1

## 2014-11-09 NOTE — Progress Notes (Signed)
SUBJECTIVE: Still c/o cough    Filed Vitals:   11/08/14 2353 11/09/14 0254 11/09/14 0350 11/09/14 0434  BP:  104/65  128/95  Pulse:  63  80  Temp:    97.9 F (36.6 C)  TempSrc:    Axillary  Resp:    20  Height:      Weight:    151.91 kg (334 lb 14.4 oz)  SpO2: 96%  95% 95%    Intake/Output Summary (Last 24 hours) at 11/09/14 0848 Last data filed at 11/09/14 0732  Gross per 24 hour  Intake    120 ml  Output   1675 ml  Net  -1555 ml    LABS: Basic Metabolic Panel:  Recent Labs  11/91/47 0457 11/08/14 0405 11/09/14 0354  NA 131* 132* 133*  K 3.5 4.2 4.0  CL 87* 88* 89*  CO2 36* 34* 36*  GLUCOSE 185* 196* 149*  BUN 36* 43* 43*  CREATININE 0.90 1.02* 0.98  CALCIUM 8.5* 8.4* 8.5*  MG 1.9  --   --   PHOS 4.0  --   --    Liver Function Tests: No results for input(s): AST, ALT, ALKPHOS, BILITOT, PROT, ALBUMIN in the last 72 hours. No results for input(s): LIPASE, AMYLASE in the last 72 hours. CBC: No results for input(s): WBC, NEUTROABS, HGB, HCT, MCV, PLT in the last 72 hours. Cardiac Enzymes: No results for input(s): CKTOTAL, CKMB, CKMBINDEX, TROPONINI in the last 72 hours. BNP: Invalid input(s): POCBNP D-Dimer: No results for input(s): DDIMER in the last 72 hours. Hemoglobin A1C: No results for input(s): HGBA1C in the last 72 hours. Fasting Lipid Panel: No results for input(s): CHOL, HDL, LDLCALC, TRIG, CHOLHDL, LDLDIRECT in the last 72 hours. Thyroid Function Tests: No results for input(s): TSH, T4TOTAL, T3FREE, THYROIDAB in the last 72 hours.  Invalid input(s): FREET3 Anemia Panel: No results for input(s): VITAMINB12, FOLATE, FERRITIN, TIBC, IRON, RETICCTPCT in the last 72 hours.   PHYSICAL EXAM General: obese, critically ill appearing HEENT: Normocephalic and atramatic Neck: No JVD.  Lungs: rhochi b/l  Heart: irregularly irregular  Abdomen: Bowel sounds are positive, abdomen soft and non-tender  Msk: Back normal, normal gait. Normal  strength and tone for age. Extremities: trace edema b/l.  Neuro: Alert and oriented X 3. Psych: Good affect, responds appropriately  TELEMETRY: Reviewed telemetry pt in a-fib VR in 80s  ASSESSMENT AND PLAN: acute respiratory distress 2/2 pulmonary edema vs PNA. Echo shows EF 40%, grade 1 diastolic dysfunction, mild MR, mild pulmonary HTN. Advise continuing carvedilol 6.25mg  BID, cardizem  TID, and spironolactone for diuresis since lasix was held 2/2 hypokalemia. Spoke with pts husband regarding cardiac issues and urged him to have her f/u as outpatient.   -A-fib now rate controlled and protected with eliquis. Urged pt to wear CPAP once back at rehab facility as many of these cardio-pulmonary issues are likely 2/2 non-compliance with CPAP.   Principal Problem:   Acute on chronic respiratory failure with hypoxia Active Problems:   Acute pulmonary edema   Leucocytosis   Elevated troponin   Paroxysmal a-fib   OSA on CPAP   COPD (chronic obstructive pulmonary disease)   DM (diabetes mellitus)   HTN (hypertension)   Atrial fibrillation with rapid ventricular response    Adrian Blackwater A, MD, [facility] 11/09/2014 8:48 AM

## 2014-11-09 NOTE — Progress Notes (Signed)
MD, Dr. Elisabeth Pigeon notified due to low BP/HR in 70's.  Cardizem 60 po ordered.  MD ordered to hold dose, continue to monitor. Louis Meckel

## 2014-11-09 NOTE — Progress Notes (Signed)
Initial Nutrition Assessment    INTERVENTION:  Meals and snacks: Cater to pt preferences Coordination of care: Recommend outpatient RD counseling for wt management Nutrition diet education: Pt familiar with diet restrictions, did not want to discuss further   NUTRITION DIAGNOSIS:   Inadequate oral intake related to acute illness as evidenced by per patient/family report.    GOAL:   Patient will meet greater than or equal to 90% of their needs    MONITOR:    (Energy intake, Pulmonary profile, Electrolyte and renal profile, Anthropometric)  REASON FOR ASSESSMENT:   LOS    ASSESSMENT:      Pt admitted with acute respiratory failure, pulmonary edema vs pneumonia  Past Medical History  Diagnosis Date  . COPD (chronic obstructive pulmonary disease)   . Arthritis   . Hypertension   . Diabetes mellitus without complication   . CHF (congestive heart failure)   . Sleep apnea   . Depression      Current Nutrition: Pt reports eating about 50% of meals during admission. Per I and O sheet noted 100%, 100% and 30% documented  Food/Nutrition-Related History: Pt reports appetite has been "fair" for the 6 weeks.  Unable to give further detail information about what typically eats in a days time.    Medications: dulcolax, colace, lasix, aspart, lantus  Electrolyte/Renal Profile and Glucose Profile:   Recent Labs Lab 11/05/14 0342  11/06/14 0421 11/07/14 0457 11/08/14 0405 11/09/14 0354  NA 132*  --  132* 131* 132* 133*  K 2.8*  < > 3.9 3.5 4.2 4.0  CL 85*  --  88* 87* 88* 89*  CO2 34*  --  34* 36* 34* 36*  BUN 21*  --  26* 36* 43* 43*  CREATININE 0.73  --  0.85 0.90 1.02* 0.98  CALCIUM 8.0*  --  8.3* 8.5* 8.4* 8.5*  MG 1.9  --  2.0 1.9  --   --   PHOS 2.6  --  3.9 4.0  --   --   GLUCOSE 117*  --  179* 185* 196* 149*  < > = values in this interval not displayed. Protein Profile:  Recent Labs Lab 11/03/14 0352  ALBUMIN 2.6*    Gastrointestinal  Profile: Last BM:8/29     Weight Change: wt encounters reviewed during admission   Diet Order:  Diet heart healthy/carb modified Room service appropriate?: Yes; Fluid consistency:: Thin  Skin:   reviewed   Height:   Ht Readings from Last 1 Encounters:  11/03/14  (1.499 m)    Weight:   Wt Readings from Last 1 Encounters:  11/09/14 334 lb 14.4 oz (151.91 kg)        BMI:  Body mass index is 67.61 kg/(m^2).   EDUCATION NEEDS:   No education needs identified at this time  LOW Care Level  Saara Kijowski B. Freida Busman, RD, LDN [phone] (pager)

## 2014-11-09 NOTE — Progress Notes (Signed)
PT Cancellation Note  Patient Details Name: Eileen [name] MRN: [id number] DOB: 24-Apr-1945   Cancelled Treatment:    Reason Eval/Treat Not Completed: Patient declined, no reason specified (Treatment session attempted; patient refused, stating "I just don't feel up to it today.  I don't have the energy".  Unable to encourage/redirect.  Will continue efforts as appropriate.  Will continue efforts for additional 1-2 treatment sessions; if participation/progress does not improve, will consider discontinuing skilled PT services due to lack of participation/progress.)   Marylin Lathon H. Manson Passey, PT, DPT, NCS 11/09/2014, 11:33 AM [phone]

## 2014-11-09 NOTE — Progress Notes (Signed)
Remains very weak No new complaints No distress    Filed Vitals:   11/09/14 0434 11/09/14 0856 11/09/14 0859 11/09/14 1113  BP: 128/95 94/28 93/71  111/73  Pulse: 80 88 75 66  Temp: 97.9 F (36.6 C) 97.7 F (36.5 C)  97.6 F (36.4 C)  TempSrc: Axillary Oral  Oral  Resp: Height:      Weight: 151.91 kg (334 lb 14.4 oz)     SpO2: 95% 94%  93%   Obese, NAD HEENT WNL JVP cannot be visualized Scattered wheezes IRIR, rate controlled, no M Obese, soft, +BS No edema Neuro grossly intact  BMP Latest Ref Rng 11/09/2014 11/08/2014 11/07/2014  Glucose 65 - 99 mg/dL 147(W) 295(A) 213(Y)  BUN 6 - 20 mg/dL 86(V) 78(I) 69(G)  Creatinine 0.44 - 1.00 mg/dL 2.95 2.84(X) 3.24  Sodium 135 - 145 mmol/L 133(L) 132(L) 131(L)  Potassium 3.5 - 5.1 mmol/L 4.0 4.2 3.5  Chloride 101 - 111 mmol/L 89(L) 88(L) 87(L)  CO2 22 - 32 mmol/L 36(H) 34(H) 36(H)  Calcium 8.9 - 10.3 mg/dL 4.0(N) 0.2(V) 2.5(D)    CBC Latest Ref Rng 11/06/2014 11/05/2014 11/03/2014  WBC 3.6 - 11.0 K/uL 7.8 10.3 14.2(H)  Hemoglobin 12.0 - 16.0 g/dL 11.5(L) 11.8(L) 12.4  Hematocrit 35.0 - 47.0 % 36.4 36.9 39.0  Platelets 150 - 440 K/uL 135(L) 144(L) 157    No new film  IMPRESSION: Acute on chronic hypoxic respiratory failure COPD with persistent bronchospasm Probable PNA, NOS AF, rate well controlled  PLAN/REC: Cont levofloxacin - complete 10 days total Cont nebulized BDs and steroids Taper prednisone over 5-7 days Cont nocturnal NPPV Recheck CXR AM 9/03  Billy Fischer, MD PCCM service Mobile [phone] Pager [phone]

## 2014-11-09 NOTE — Progress Notes (Signed)
MD, Dr. Anne Hahn about scheduled IV 40 Lasix dose.  MD ordered to hold dose due to low BP.  Will continue to monitor.  Louis Meckel

## 2014-11-09 NOTE — Progress Notes (Signed)
Patient ID: Eileen [name], female   DOB: Oct 13, 1945, 69 y.o.   MRN: [id number] [facility] PROGRESS NOTE  PCP: Laurier Nancy, MD  HPI/Subjective: Patient still short of breath. She is wheezing and coughing up greenish yellow phlegm. Still not feeling well. Still feeling very weak.  Objective: Filed Vitals:   11/09/14 1113  BP: 111/73  Pulse: 66  Temp: 97.6 F (36.4 C)  Resp: 20    Filed Weights   11/07/14 0500 11/08/14 0511 11/09/14 0434  Weight: 152.8 kg (336 lb 13.8 oz) 149.732 kg (330 lb 1.6 oz) 151.91 kg (334 lb 14.4 oz)    ROS: Review of Systems  Constitutional: Positive for malaise/fatigue. Negative for fever and chills.  Eyes: Negative for blurred vision.  Respiratory: Positive for cough, sputum production and shortness of breath.   Cardiovascular: Negative for chest pain.  Gastrointestinal: Negative for nausea, vomiting, abdominal pain, diarrhea and constipation.  Genitourinary: Negative for dysuria.  Musculoskeletal: Negative for joint pain.  Neurological: Positive for weakness. Negative for dizziness and headaches.   Exam: Physical Exam  Constitutional: She is oriented to person, place, and time.  HENT:  Nose: No mucosal edema.  Mouth/Throat: No oropharyngeal exudate or posterior oropharyngeal edema.  Eyes: Conjunctivae, EOM and lids are normal. Pupils are equal, round, and reactive to light.  Neck: No JVD present. Carotid bruit is not present. No edema present. No thyroid mass and no thyromegaly present.  Cardiovascular: S1 normal and S2 normal.  Exam reveals no gallop.   Murmur heard.  Systolic murmur is present with a grade of 2/6  Pulses:      Dorsalis pedis pulses are 2+ on the right side, and 2+ on the left side.  Respiratory: No respiratory distress. She has decreased breath sounds in the right middle field, the right lower field, the left middle field and the left lower field. She has wheezes in the right lower field and the left  lower field. She has no rhonchi. She has no rales.  GI: Soft. Bowel sounds are normal. There is no tenderness.  Musculoskeletal:       Right ankle: She exhibits swelling.       Left ankle: She exhibits swelling.  Lymphadenopathy:    She has no cervical adenopathy.  Neurological: She is alert and oriented to person, place, and time. No cranial nerve deficit.  Skin: Skin is warm. Nails show no clubbing.  Chronic lower extremity discoloration bilaterally  Psychiatric: She has a normal mood and affect.    Data Reviewed: Basic Metabolic Panel:  Recent Labs Lab 11/04/14 1141  11/05/14 0342 11/05/14 1918 11/06/14 0421 11/07/14 0457 11/08/14 0405 11/09/14 0354  NA  --   --  132*  --  132* 131* 132* 133*  K 2.6*  < > 2.8* 3.7 3.9 3.5 4.2 4.0  CL  --   --  85*  --  88* 87* 88* 89*  CO2  --   --  34*  --  34* 36* 34* 36*  GLUCOSE  --   --  117*  --  179* 185* 196* 149*  BUN  --   --  21*  --  26* 36* 43* 43*  CREATININE  --   --  0.73  --  0.85 0.90 1.02* 0.98  CALCIUM  --   --  8.0*  --  8.3* 8.5* 8.4* 8.5*  MG 1.4*  --  1.9  --  2.0 1.9  --   --   PHOS  --   --  2.6  --  3.9 4.0  --   --   < > = values in this interval not displayed. Liver Function Tests:  Recent Labs Lab 11/03/14 0352  AST 21  ALT 11*  ALKPHOS 128*  BILITOT 0.6  PROT 6.5  ALBUMIN 2.6*   CBC:  Recent Labs Lab 11/03/14 0352 11/05/14 0342 11/06/14 0421  WBC 14.2* 10.3 7.8  HGB 12.4 11.8* 11.5*  HCT 39.0 36.9 36.4  MCV 90.4 89.5 89.4  PLT 157 144* 135*   Cardiac Enzymes:  Recent Labs Lab 11/03/14 0352 11/03/14 0915 11/03/14 1503  TROPONINI 0.48* 0.04* 0.05*   BNP (last 3 results)  Recent Labs  11/03/14 0352  BNP 447.0*   CBG:  Recent Labs Lab 11/08/14 1058 11/08/14 1613 11/08/14 2107 11/09/14 0725 11/09/14 1104  GLUCAP 169* 214* 210* 129* 174*    Recent Results (from the past 240 hour(s))  MRSA PCR Screening     Status: None   Collection Time: 11/03/14  9:41 AM  Result  Value Ref Range Status   MRSA by PCR NEGATIVE NEGATIVE Final    Comment:        The GeneXpert MRSA Assay (FDA approved for NASAL specimens only), is one component of a comprehensive MRSA colonization surveillance program. It is not intended to diagnose MRSA infection nor to guide or monitor treatment for MRSA infections.   Culture, expectorated sputum-assessment     Status: None (Preliminary result)   Collection Time: 11/09/14  6:30 AM  Result Value Ref Range Status   Specimen Description EXPECTORATED SPUTUM  Final   Special Requests Normal  Final   Sputum evaluation THIS SPECIMEN IS ACCEPTABLE FOR SPUTUM CULTURE  Final   Report Status PENDING  Incomplete     Studies: No results found.  Scheduled Meds: . acetaZOLAMIDE  250 mg Oral BID  . albuterol  2.5 mg Nebulization Q4H  . antiseptic oral rinse  7 mL Mouth Rinse q12n4p  . apixaban  5 mg Oral BID  . aspirin EC  81 mg Oral Daily  . budesonide (PULMICORT) nebulizer solution  0.5 mg Nebulization BID  . carvedilol  6.25 mg Oral BID WC  . chlorhexidine  15 mL Mouth Rinse BID  . diltiazem  60 mg Oral 3 times per day  . docusate sodium  200 mg Oral BID  . furosemide  40 mg Intravenous Q12H  . insulin aspart  0-15 Units Subcutaneous TID WC & HS  . insulin glargine  15 Units Subcutaneous QHS  . levofloxacin  750 mg Oral Daily  . [START ON 11/10/2014] lisinopril  2.5 mg Oral Daily  . mupirocin ointment   Nasal BID  . predniSONE  50 mg Oral Q breakfast  . sertraline  100 mg Oral Daily  . sodium chloride  3 mL Intravenous Q12H  . spironolactone  50 mg Oral Daily  . theophylline  200 mg Oral Q12H  . tiotropium  18 mcg Inhalation Daily  . traZODone  100 mg Oral QHS  . trimethoprim-polymyxin b  2 drop Left Eye 6 times per day    Assessment/Plan:  1. Acute on chronic respiratory failure with hypoxia. Continue oxygen supplementation 4 L 2. Acute on chronic combined systolic and diastolic congestive heart failure. Patient is on IV  Lasix 40 mg IV twice a day. Also on spironolactone. Continue Coreg. Add low-dose lisinopril for tomorrow 3. Pneumonia and leukocytosis- continue Levaquin 4. COPD exacerbation with Wheezes in the lungs- on prednisone high oral dose and budesonide  nebulizers. Patient has been very slow to progress with regards to her wheezing and symptoms. 5. Diabetes mellitus without complication- continue Lantus and sliding scale 6. Morbid obesity and sleep apnea- patient must lose weight. CPAP at night 7. Atrial fibrillation with rapid ventricular response on Coreg and diltiazem 8. Elevated troponin likely demand ischemia from rapid heart rate 9. Weakness- Awaiting physical therapy evaluation   Code Status:     Code Status Orders        Start     Ordered   Nov 28, 2014 0853  Do not attempt resuscitation (DNR)   Continuous    Question Answer Comment  In the event of cardiac or respiratory ARREST Do not call a "code blue"   In the event of cardiac or respiratory ARREST Do not perform Intubation, CPR, defibrillation or ACLS   In the event of cardiac or respiratory ARREST Use medication by any route, position, wound care, and other measures to relive pain and suffering. May use oxygen, suction and manual treatment of airway obstruction as needed for comfort.   Comments RN may pronounce death      11-28-2014 2130    Advance Directive Documentation        Most Recent Value   Type of Advance Directive  Out of facility DNR (pink MOST or yellow form)   Pre-existing out of facility DNR order (yellow form or pink MOST form)  Yellow form placed in chart (order not valid for inpatient use)   "MOST" Form in Place?       Disposition Plan: May need rehabilitation   Consultants:  Cardiology   Time spent: 20 minutes  Alford Highland  [facility]

## 2014-11-09 NOTE — Progress Notes (Signed)
  Assessment and Plan:   -Acute on chronic hypoxic respiratory failure Multifactorial due to factors below.   -Pneumonia. 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 Apparently this was of intermediate resistance to ciprofloxacin. Would therefore add Bactrim to the patient's regimen.  -COPD with persistent bronchospasm Appears better controlled with current dose of prednisone can continue steroid taper as tolerated. Continue Spiriva, Pulmicort, and nebulizers.   -Acute on chronic hypercapnic respiratory failure. Likely due to both COPD as well as an element of obesity hypoventilation syndrome. Continue BiPAP.   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 This appears to be better compensated with reduced pulmonary edema at this time. Continue Lasix.    HPI:  Remains very weak, she is currently resting comfortably, wearing her BiPAP. She has no new complaints today.  Filed Vitals:   11/09/14 0856 11/09/14 0859 11/09/14 1113 11/09/14 1328  BP: 94/28 93/71 111/73 115/59  Pulse: 88 75 66 62  Temp: 97.7 F (36.5 C)  97.6 F (36.4 C)   TempSrc: Oral  Oral   Resp: 19  20   Height:      Weight:      SpO2: 94%  93%    Obese, NAD HEENT WNL JVP cannot be visualized Scattered wheezes IRIR, rate controlled, no M Obese, soft, +BS No edema Neuro grossly intact  BMP Latest Ref Rng 11/09/2014 11/08/2014 11/07/2014  Glucose 65 - 99 mg/dL 130(Q) 657(Q) 469(G)  BUN 6 - 20 mg/dL 29(B) 28(U) 13(K)  Creatinine 0.44 - 1.00 mg/dL 4.40 1.02(V) 2.53  Sodium 135 - 145 mmol/L 133(L) 132(L) 131(L)  Potassium 3.5 - 5.1 mmol/L 4.0 4.2 3.5  Chloride 101 - 111 mmol/L 89(L) 88(L) 87(L)  CO2 22 - 32 mmol/L 36(H) 34(H) 36(H)  Calcium 8.9 - 10.3 mg/dL 6.6(Y) 4.0(H) 4.7(Q)    CBC Latest Ref Rng 11/06/2014 11/05/2014 11/03/2014  WBC 3.6 - 11.0 K/uL 7.8 10.3 14.2(H)  Hemoglobin 12.0 - 16.0 g/dL 11.5(L) 11.8(L) 12.4  Hematocrit 35.0  - 47.0 % 36.4 36.9 39.0  Platelets 150 - 440 K/uL 135(L) 144(L) 157         Deep Nicholos Johns, MD.  Board Certified in Internal Medicine, Pulmonary Medicine, Critical Care Medicine, and Sleep Medicine.  [facility] Office Number: [phone]  Santiago Glad, M.D.  Stephanie Acre, M.D.  Billy Fischer, M.D

## 2014-11-10 ENCOUNTER — Inpatient Hospital Stay: Payer: PPO

## 2014-11-10 DIAGNOSIS — J9620 Acute and chronic respiratory failure, unspecified whether with hypoxia or hypercapnia: Secondary | ICD-10-CM

## 2014-11-10 LAB — CBC
HEMATOCRIT: 37.5 % (ref 35.0–47.0)
Hemoglobin: 12.2 g/dL (ref 12.0–16.0)
MCH: 28.9 pg (ref 26.0–34.0)
MCHC: 32.6 g/dL (ref 32.0–36.0)
MCV: 88.6 fL (ref 80.0–100.0)
Platelets: 85 10*3/uL — ABNORMAL LOW (ref 150–440)
RBC: 4.23 MIL/uL (ref 3.80–5.20)
RDW: 15.4 % — AB (ref 11.5–14.5)
WBC: 10.2 10*3/uL (ref 3.6–11.0)

## 2014-11-10 LAB — EXPECTORATED SPUTUM ASSESSMENT W REFEX TO RESP CULTURE: SPECIAL REQUESTS: NORMAL

## 2014-11-10 LAB — GLUCOSE, CAPILLARY
GLUCOSE-CAPILLARY: 171 mg/dL — AB (ref 65–99)
Glucose-Capillary: 114 mg/dL — ABNORMAL HIGH (ref 65–99)
Glucose-Capillary: 159 mg/dL — ABNORMAL HIGH (ref 65–99)
Glucose-Capillary: 182 mg/dL — ABNORMAL HIGH (ref 65–99)

## 2014-11-10 MED ORDER — FUROSEMIDE 10 MG/ML IJ SOLN
60.0000 mg | Freq: Two times a day (BID) | INTRAMUSCULAR | Status: DC
  Administered 2014-11-10 – 2014-11-11 (×2): 60 mg via INTRAVENOUS
  Filled 2014-11-10 (×4): qty 6

## 2014-11-10 NOTE — Progress Notes (Signed)
  Assessment and Plan:   -Acute on chronic hypoxic respiratory failure Multifactorial due to factors below. Her respiratory status appears to be significantly improved, she is currently on her baseline level of 4 L of oxygen. The patient is followed by Dr. Park Breed as her outpatient pulmonologist.   -Pneumonia. 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 Apparently this was of intermediate resistance to ciprofloxacin, therefore added Bactrim to the patient's regimen. This can be de-escalated once sensitivities are available.  Recommend advancing her activity, physical therapy consultation to be helpful. Outpatient to pulmonary rehabilitation may also be helpful.   -COPD with persistent bronchospasm Appears better controlled with current dose of prednisone can continue steroid taper as tolerated. Continue Spiriva, Pulmicort, and nebulizers.   -Acute on chronic hypercapnic respiratory failure. Likely due to both COPD as well as an element of obesity hypoventilation syndrome. Continue BiPAP.   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 This appears to be better compensated with reduced pulmonary edema at this time. Continue Lasix.  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 Weight loss is recommended, which would be beneficial to her breathing, and overall health.   HPI: Remains very weak, she is currently resting comfortably, wearing her BiPAP. She has no new complaints today.  Filed Vitals:   11/10/14 0430 11/10/14 0628 11/10/14 0738 11/10/14 0753  BP:  106/59  99/46  Pulse:  68  78  Temp:      TempSrc:      Resp:      Height:      Weight:      SpO2: 94%  94%    Obese, NAD HEENT WNL JVP cannot be visualized Scattered wheezes IRIR, rate controlled, no M Obese, soft, +BS No edema Neuro grossly intact  BMP Latest Ref Rng 11/09/2014 11/08/2014 11/07/2014  Glucose 65 - 99 mg/dL  098(J) 191(Y) 782(N)  BUN 6 - 20 mg/dL 56(O) 13(Y) 86(V)  Creatinine 0.44 - 1.00 mg/dL 7.84 6.96(E) 9.52  Sodium 135 - 145 mmol/L 133(L) 132(L) 131(L)  Potassium 3.5 - 5.1 mmol/L 4.0 4.2 3.5  Chloride 101 - 111 mmol/L 89(L) 88(L) 87(L)  CO2 22 - 32 mmol/L 36(H) 34(H) 36(H)  Calcium 8.9 - 10.3 mg/dL 8.4(X) 3.2(G) 4.0(N)    CBC Latest Ref Rng 11/10/2014 11/06/2014 11/05/2014  WBC 3.6 - 11.0 K/uL 10.2 7.8 10.3  Hemoglobin 12.0 - 16.0 g/dL 02.7 11.5(L) 11.8(L)  Hematocrit 35.0 - 47.0 % 37.5 36.4 36.9  Platelets 150 - 440 K/uL 85(L) 135(L) 144(L)         Deep Nicholos Johns, MD.  Board Certified in Internal Medicine, Pulmonary Medicine, Critical Care Medicine, and Sleep Medicine.  [facility] Office Number: [phone]  Santiago Glad, M.D.  Stephanie Acre, M.D.  Billy Fischer, M.D

## 2014-11-10 NOTE — Progress Notes (Signed)
Patient alert and oriented x4, no complaints at this time. vss at this time. Patient afib on telemetry. Will continue to assess. Dae Highley R Mansfield   

## 2014-11-10 NOTE — Progress Notes (Signed)
Patient ID: Eileen [name], female   DOB: 25-Mar-1945, 69 y.o.   MRN: [id number] [facility] PROGRESS NOTE  PCP: Laurier Nancy, MD  HPI/Subjective: Patient still not feeling any better. Patient still short of breath. She is wheezing and coughing up greenish yellow phlegm. Still not feeling well. Still feeling very weak.  Objective: Filed Vitals:   11/10/14 0753  BP: 99/46  Pulse: 78  Temp:   Resp:     Filed Weights   11/08/14 0511 11/09/14 0434 11/10/14 0317  Weight: 149.732 kg (330 lb 1.6 oz) 151.91 kg (334 lb 14.4 oz) 153.1 kg (337 lb 8.4 oz)    ROS: Review of Systems  Constitutional: Positive for malaise/fatigue. Negative for fever and chills.  Eyes: Negative for blurred vision.  Respiratory: Positive for cough, sputum production and shortness of breath.   Cardiovascular: Negative for chest pain.  Gastrointestinal: Negative for nausea, vomiting, abdominal pain, diarrhea and constipation.  Genitourinary: Negative for dysuria.  Musculoskeletal: Negative for joint pain.  Neurological: Positive for weakness. Negative for dizziness and headaches.   Exam: Physical Exam  Constitutional: She is oriented to person, place, and time.  HENT:  Nose: No mucosal edema.  Mouth/Throat: No oropharyngeal exudate or posterior oropharyngeal edema.  Eyes: Conjunctivae, EOM and lids are normal. Pupils are equal, round, and reactive to light.  Neck: No JVD present. Carotid bruit is not present. No edema present. No thyroid mass and no thyromegaly present.  Cardiovascular: S1 normal and S2 normal.  Exam reveals no gallop.   Murmur heard.  Systolic murmur is present with a grade of 2/6  Pulses:      Dorsalis pedis pulses are 2+ on the right side, and 2+ on the left side.  Respiratory: No respiratory distress. She has decreased breath sounds in the right lower field and the left lower field. She has wheezes in the right lower field, the left middle field and the left lower  field. She has no rhonchi. She has no rales.  GI: Soft. Bowel sounds are normal. There is no tenderness.  Musculoskeletal:       Right ankle: She exhibits swelling.       Left ankle: She exhibits swelling.  Lymphadenopathy:    She has no cervical adenopathy.  Neurological: She is alert and oriented to person, place, and time. No cranial nerve deficit.  Skin: Skin is warm. Nails show no clubbing.  Chronic lower extremity discoloration bilaterally  Psychiatric: She has a normal mood and affect.    Data Reviewed: Basic Metabolic Panel:  Recent Labs Lab 11/04/14 1141  11/05/14 0342 11/05/14 1918 11/06/14 0421 11/07/14 0457 11/08/14 0405 11/09/14 0354  NA  --   --  132*  --  132* 131* 132* 133*  K 2.6*  < > 2.8* 3.7 3.9 3.5 4.2 4.0  CL  --   --  85*  --  88* 87* 88* 89*  CO2  --   --  34*  --  34* 36* 34* 36*  GLUCOSE  --   --  117*  --  179* 185* 196* 149*  BUN  --   --  21*  --  26* 36* 43* 43*  CREATININE  --   --  0.73  --  0.85 0.90 1.02* 0.98  CALCIUM  --   --  8.0*  --  8.3* 8.5* 8.4* 8.5*  MG 1.4*  --  1.9  --  2.0 1.9  --   --   PHOS  --   --  2.6  --  3.9 4.0  --   --   < > = values in this interval not displayed. CBC:  Recent Labs Lab 11/05/14 0342 11/06/14 0421 11/10/14 0357  WBC 10.3 7.8 10.2  HGB 11.8* 11.5* 12.2  HCT 36.9 36.4 37.5  MCV 89.5 89.4 88.6  PLT 144* 135* 85*     CBG:  Recent Labs Lab 11/09/14 0725 11/09/14 1104 11/09/14 1640 11/09/14 1953 11/10/14 0733  GLUCAP 129* 174* 210* 253* 114*    Recent Results (from the past 240 hour(s))  MRSA PCR Screening     Status: None   Collection Time: 11/03/14  9:41 AM  Result Value Ref Range Status   MRSA by PCR NEGATIVE NEGATIVE Final    Comment:        The GeneXpert MRSA Assay (FDA approved for NASAL specimens only), is one component of a comprehensive MRSA colonization surveillance program. It is not intended to diagnose MRSA infection nor to guide or monitor treatment for MRSA  infections.   Culture, expectorated sputum-assessment     Status: None (Preliminary result)   Collection Time: 11/09/14  6:30 AM  Result Value Ref Range Status   Specimen Description EXPECTORATED SPUTUM  Final   Special Requests Normal  Final   Sputum evaluation THIS SPECIMEN IS ACCEPTABLE FOR SPUTUM CULTURE  Final   Report Status PENDING  Incomplete     Studies: No results found.  Scheduled Meds: . acetaZOLAMIDE  250 mg Oral BID  . albuterol  2.5 mg Nebulization Q4H  . antiseptic oral rinse  7 mL Mouth Rinse q12n4p  . apixaban  5 mg Oral BID  . budesonide (PULMICORT) nebulizer solution  0.5 mg Nebulization BID  . carvedilol  6.25 mg Oral BID WC  . chlorhexidine  15 mL Mouth Rinse BID  . diltiazem  60 mg Oral 3 times per day  . docusate sodium  200 mg Oral BID  . furosemide  40 mg Intravenous Q12H  . insulin aspart  0-15 Units Subcutaneous TID WC & HS  . insulin glargine  15 Units Subcutaneous QHS  . levofloxacin  750 mg Oral Daily  . mupirocin ointment   Nasal BID  . predniSONE  50 mg Oral Q breakfast  . sertraline  100 mg Oral Daily  . sodium chloride  3 mL Intravenous Q12H  . spironolactone  50 mg Oral Daily  . sulfamethoxazole-trimethoprim  20 mL Oral BID  . theophylline  200 mg Oral Q12H  . tiotropium  18 mcg Inhalation Daily  . traZODone  100 mg Oral QHS  . trimethoprim-polymyxin b  2 drop Left Eye 6 times per day    Assessment/Plan:  1. Acute on chronic respiratory failure with hypoxia. Continue oxygen supplementation 4 L 2. Acute on chronic combined systolic and diastolic congestive heart failure. Patient is on IV Lasix 40 mg IV twice a day. Also on spironolactone. Continue Coreg. Blood pressure too low this morning for lisinopril 3. Pneumonia and leukocytosis- continue Levaquin, Bactrim added. Sputum culture in the lab. Repeat chest x-ray today 4. COPD exacerbation with Wheezes in the lungs- on prednisone high oral dose and budesonide nebulizers. Patient has been  very slow to progress with regards to her wheezing and symptoms. 5. Diabetes mellitus without complication- continue Lantus and sliding scale 6. Morbid obesity and sleep apnea- patient must lose weight. CPAP at night 7. Atrial fibrillation with rapid ventricular response on Coreg and diltiazem 8. Elevated troponin likely demand ischemia from rapid heart rate  9. Weakness- physical therapy evaluation  10. Thrombocytopenia- could be antibiotics related. Continue to monitor platelet count  Code Status:     Code Status Orders        Start     Ordered   24-Nov-2014 0853  Do not attempt resuscitation (DNR)   Continuous    Question Answer Comment  In the event of cardiac or respiratory ARREST Do not call a "code blue"   In the event of cardiac or respiratory ARREST Do not perform Intubation, CPR, defibrillation or ACLS   In the event of cardiac or respiratory ARREST Use medication by any route, position, wound care, and other measures to relive pain and suffering. May use oxygen, suction and manual treatment of airway obstruction as needed for comfort.   Comments RN may pronounce death      11/24/14 5784    Advance Directive Documentation        Most Recent Value   Type of Advance Directive  Out of facility DNR (pink MOST or yellow form)   Pre-existing out of facility DNR order (yellow form or pink MOST form)  Yellow form placed in chart (order not valid for inpatient use)   "MOST" Form in Place?       Disposition Plan: May need rehabilitation   Consultants:  Cardiology   Time spent: 20 minutes  Alford Highland  [facility] Hospitalists

## 2014-11-10 NOTE — Clinical Social Work Note (Signed)
Clinical Social Work Assessment  Patient Details  Name: Eileen [name] MRN: [id number] Date of Birth: Jun 23, 1945  Date of referral:  11/08/14               Reason for consult:  Facility Placement, Other (Comment Required) (From [facility])                Permission sought to share information with:  Chartered certified accountant granted to share information::  Yes, Verbal Permission Granted  Name::      Public affairs consultant::   [place]   Relationship::     Contact Information:     Housing/Transportation Living arrangements for the past 2 months:  [place] of Information:  Patient Patient Interpreter Needed:  None Criminal Activity/Legal Involvement Pertinent to Current Situation/Hospitalization:  No - Comment as needed Significant Relationships:  Spouse Lives with:  Facility Resident Do you feel safe going back to the place where you live?  Yes Need for family participation in patient care:  Yes (Comment)  Care giving concerns: Patient is a resident at [facility].    Social Worker assessment / plan: Holiday representative (CSW) met with patient to discuss D/C plan. Patient was alone laying in the bed. Patient was alert and oriented. CSW introduced self and explained role of CSW department. Patient reported that she has been a resident at [facility] for 6 months. Patient reported that she went to [place] for short term rehab has not been able to return home yet. Patient reported that she has applied for Medicaid but does not have it yet. Per patient she lived with her husband Delfino Lovett in [place] before she went to [facility]. Patient reported that she is agreeable to return to [facility] when medically stable.   FL2 updated and on chart.   Employment status:  Disabled (Comment on whether or not currently receiving Disability), Retired Office manager, Self Pay (Medicaid Pending) (Medicaid pending per patient) PT Recommendations:  [place] / Referral to community resources:  [place]  Patient/Family's Response to care: Patient is agreeable to returning to [facility].   Patient/Family's Understanding of and Emotional Response to Diagnosis, Current Treatment, and Prognosis: Patient thanked CSW for visit and was pleasant throughout assessment.   Emotional Assessment Appearance:  Appears stated age Attitude/Demeanor/Rapport:    Affect (typically observed):  Accepting, Adaptable, Pleasant Orientation:  Oriented to Self, Oriented to Place, Oriented to  Time, Oriented to Situation Alcohol / Substance use:  Not Applicable Psych involvement (Current and /or in the community):  No (Comment)  Discharge Needs  Concerns to be addressed:  Discharge Planning Concerns Readmission within the last 30 days:  No Current discharge risk:  None Barriers to Discharge:  Continued Medical Work up   Loralyn Freshwater, LCSW 11/10/2014, 3:19 PM

## 2014-11-11 LAB — GLUCOSE, CAPILLARY
GLUCOSE-CAPILLARY: 133 mg/dL — AB (ref 65–99)
GLUCOSE-CAPILLARY: 159 mg/dL — AB (ref 65–99)
GLUCOSE-CAPILLARY: 146 mg/dL — AB (ref 65–99)
Glucose-Capillary: 91 mg/dL (ref 65–99)

## 2014-11-11 LAB — BASIC METABOLIC PANEL
ANION GAP: 11 (ref 5–15)
BUN: 36 mg/dL — ABNORMAL HIGH (ref 6–20)
CALCIUM: 8.2 mg/dL — AB (ref 8.9–10.3)
CHLORIDE: 85 mmol/L — AB (ref 101–111)
CO2: 35 mmol/L — AB (ref 22–32)
Creatinine, Ser: 0.99 mg/dL (ref 0.44–1.00)
GFR calc non Af Amer: 56 mL/min — ABNORMAL LOW (ref >60)
GLUCOSE: 100 mg/dL — AB (ref 65–99)
Potassium: 3 mmol/L — ABNORMAL LOW (ref 3.5–5.1)
Sodium: 131 mmol/L — ABNORMAL LOW (ref 135–145)

## 2014-11-11 LAB — CBC
HEMATOCRIT: 37.5 % (ref 35.0–47.0)
HEMOGLOBIN: 12.5 g/dL (ref 12.0–16.0)
MCH: 29.2 pg (ref 26.0–34.0)
MCHC: 33.4 g/dL (ref 32.0–36.0)
MCV: 87.5 fL (ref 80.0–100.0)
Platelets: 59 10*3/uL — ABNORMAL LOW (ref 150–440)
RBC: 4.29 MIL/uL (ref 3.80–5.20)
RDW: 15.8 % — AB (ref 11.5–14.5)
WBC: 10.4 10*3/uL (ref 3.6–11.0)

## 2014-11-11 MED ORDER — PREDNISONE 20 MG PO TABS
30.0000 mg | ORAL_TABLET | Freq: Every day | ORAL | Status: DC
  Administered 2014-11-11: 30 mg via ORAL
  Filled 2014-11-11: qty 1

## 2014-11-11 MED ORDER — PREDNISONE 20 MG PO TABS
20.0000 mg | ORAL_TABLET | Freq: Every day | ORAL | Status: DC
  Administered 2014-11-12 – 2014-11-13 (×2): 20 mg via ORAL
  Filled 2014-11-11 (×2): qty 1

## 2014-11-11 MED ORDER — POTASSIUM CHLORIDE CRYS ER 20 MEQ PO TBCR
40.0000 meq | EXTENDED_RELEASE_TABLET | Freq: Two times a day (BID) | ORAL | Status: DC
  Administered 2014-11-11 – 2014-11-13 (×5): 40 meq via ORAL
  Filled 2014-11-11: qty 1
  Filled 2014-11-11 (×5): qty 2

## 2014-11-11 NOTE — Progress Notes (Signed)
  Assessment and Plan:   -Acute on chronic hypoxic respiratory failure Multifactorial due to factors below. Her respiratory status appears to be significantly improved, she is currently on her baseline level of 4 L of oxygen. The patient should follow up with Dr. Park Breed who is her outpatient pulmonologist.   -MRSA Pneumonia. Appears significantly improved. The patient can be discharged home with a course of 10-14 days of antibiotics orally. Bactrim should be appropriate, the isolate shows intermediate resistant to ciprofloxacin.  Recommend advancing her activity, physical therapy consultation to be helpful. Outpatient to pulmonary rehabilitation may also be helpful.   -COPD with persistent bronchospasm Appears better controlled with current dose of prednisone can continue steroid taper as tolerated. Continue Spiriva, Pulmicort, and nebulizers.   -Acute on chronic hypercapnic respiratory failure. Likely due to both COPD as well as an element of obesity hypoventilation syndrome. Continue BiPAP.   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 This appears to be better compensated with reduced pulmonary edema at this time. Continue Lasix.  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 Weight loss is recommended, which would be beneficial to her breathing, and overall health.  The patient's respiratory issues appear to have stabilized. The pulmonary service. We will therefore sign off. Please call us for any questions or concerns. The patient is asked to follow up with her pulmonologist who is Dr. Park Breed.  HPI: Remains very weak, she is currently resting comfortably, wearing her BiPAP. She has no new complaints today.  Filed Vitals:   11/11/14 0440 11/11/14 0500 11/11/14 0746 11/11/14 0810  BP:    117/60  Pulse:    74  Temp:      TempSrc:      Resp:      Height:      Weight:  148.689 kg (327 lb 12.8 oz)     SpO2: 94%  94%    Obese, NAD HEENT WNL JVP cannot be visualized Scattered wheezes IRIR, rate controlled, no M Obese, soft, +BS No edema Neuro grossly intact  BMP Latest Ref Rng 11/11/2014 11/09/2014 11/08/2014  Glucose 65 - 99 mg/dL 161(W) 960(A) 540(J)  BUN 6 - 20 mg/dL 81(X) 91(Y) 78(G)  Creatinine 0.44 - 1.00 mg/dL 9.56 2.13 0.86(V)  Sodium 135 - 145 mmol/L 131(L) 133(L) 132(L)  Potassium 3.5 - 5.1 mmol/L 3.0(L) 4.0 4.2  Chloride 101 - 111 mmol/L 85(L) 89(L) 88(L)  CO2 22 - 32 mmol/L 35(H) 36(H) 34(H)  Calcium 8.9 - 10.3 mg/dL 8.2(L) 8.5(L) 8.4(L)    CBC Latest Ref Rng 11/11/2014 11/10/2014 11/06/2014  WBC 3.6 - 11.0 K/uL 10.4 10.2 7.8  Hemoglobin 12.0 - 16.0 g/dL 78.4 69.6 11.5(L)  Hematocrit 35.0 - 47.0 % 37.5 37.5 36.4  Platelets 150 - 440 K/uL 59(L) 85(L) 135(L)         Deep Nicholos Johns, MD.  Board Certified in Internal Medicine, Pulmonary Medicine, Critical Care Medicine, and Sleep Medicine.  [facility] Office Number: [phone]  Santiago Glad, M.D.  Stephanie Acre, M.D.  Billy Fischer, M.D

## 2014-11-11 NOTE — Progress Notes (Signed)
Paged Dr. Renae Gloss regarding patients vitals below:  Filed Vitals:   11/11/14 1715  BP: 80/60  Pulse: 93  Temp:   Resp:    Received instructions to hold evening lasix and discontinue diltiazem order.

## 2014-11-11 NOTE — Progress Notes (Signed)
Patient's sputum culture came back (+) for MRSA, per lab report. Patient placed in contact precautions. As patient is coughing and receiving breathing treatments, a mask will also be worn in patient room.

## 2014-11-11 NOTE — Progress Notes (Signed)
Cardizem PO not administered this morning r/t low BP, per Dr. Betti Cruz

## 2014-11-11 NOTE — Progress Notes (Signed)
Patient ID: Eileen [name], female   DOB: 1945/03/12, 69 y.o.   MRN: [id number] [facility] PROGRESS NOTE  PCP: Laurier Nancy, MD  HPI/Subjective: Patient still not feeling any better. Still with cough and productive sputum and shortness of breath.  Objective: Filed Vitals:   11/11/14 0810  BP: 117/60  Pulse: 74  Temp:   Resp:     Filed Weights   11/10/14 0317 11/11/14 0418 11/11/14 0500  Weight: 153.1 kg (337 lb 8.4 oz) 149.732 kg (330 lb 1.6 oz) 148.689 kg (327 lb 12.8 oz)    ROS: Review of Systems  Constitutional: Positive for malaise/fatigue. Negative for fever and chills.  Eyes: Negative for blurred vision.  Respiratory: Positive for cough, sputum production and shortness of breath.   Cardiovascular: Negative for chest pain.  Gastrointestinal: Negative for nausea, vomiting, abdominal pain, diarrhea and constipation.  Genitourinary: Negative for dysuria.  Musculoskeletal: Negative for joint pain.  Neurological: Positive for weakness. Negative for dizziness and headaches.   Exam: Physical Exam  Constitutional: She is oriented to person, place, and time.  HENT:  Nose: No mucosal edema.  Mouth/Throat: No oropharyngeal exudate or posterior oropharyngeal edema.  Eyes: Conjunctivae, EOM and lids are normal. Pupils are equal, round, and reactive to light.  Neck: No JVD present. Carotid bruit is not present. No edema present. No thyroid mass and no thyromegaly present.  Cardiovascular: S1 normal and S2 normal.  Exam reveals no gallop.   Murmur heard.  Systolic murmur is present with a grade of 2/6  Pulses:      Dorsalis pedis pulses are 2+ on the right side, and 2+ on the left side.  Respiratory: No respiratory distress. She has decreased breath sounds in the right lower field and the left lower field. She has wheezes in the right lower field. She has no rhonchi. She has no rales.  GI: Soft. Bowel sounds are normal. There is no tenderness.   Musculoskeletal:       Right ankle: She exhibits swelling.       Left ankle: She exhibits swelling.  Lymphadenopathy:    She has no cervical adenopathy.  Neurological: She is alert and oriented to person, place, and time. No cranial nerve deficit.  Skin: Skin is warm. Nails show no clubbing.  Chronic lower extremity discoloration bilaterally  Psychiatric: She has a normal mood and affect.    Data Reviewed: Basic Metabolic Panel:  Recent Labs Lab 11/05/14 0342  11/06/14 0421 11/07/14 0457 11/08/14 0405 11/09/14 0354 11/11/14 0432  NA 132*  --  132* 131* 132* 133* 131*  K 2.8*  < > 3.9 3.5 4.2 4.0 3.0*  CL 85*  --  88* 87* 88* 89* 85*  CO2 34*  --  34* 36* 34* 36* 35*  GLUCOSE 117*  --  179* 185* 196* 149* 100*  BUN 21*  --  26* 36* 43* 43* 36*  CREATININE 0.73  --  0.85 0.90 1.02* 0.98 0.99  CALCIUM 8.0*  --  8.3* 8.5* 8.4* 8.5* 8.2*  MG 1.9  --  2.0 1.9  --   --   --   PHOS 2.6  --  3.9 4.0  --   --   --   < > = values in this interval not displayed. CBC:  Recent Labs Lab 11/05/14 0342 11/06/14 0421 11/10/14 0357 11/11/14 0432  WBC 10.3 7.8 10.2 10.4  HGB 11.8* 11.5* 12.2 12.5  HCT 36.9 36.4 37.5 37.5  MCV 89.5 89.4 88.6  87.5  PLT 144* 135* 85* 59*     CBG:  Recent Labs Lab 11/10/14 1131 11/10/14 1643 11/10/14 1955 11/11/14 0722 11/11/14 1116  GLUCAP 159* 182* 171* 91 133*    Recent Results (from the past 240 hour(s))  MRSA PCR Screening     Status: None   Collection Time: 11/03/14  9:41 AM  Result Value Ref Range Status   MRSA by PCR NEGATIVE NEGATIVE Final    Comment:        The GeneXpert MRSA Assay (FDA approved for NASAL specimens only), is one component of a comprehensive MRSA colonization surveillance program. It is not intended to diagnose MRSA infection nor to guide or monitor treatment for MRSA infections.   Culture, expectorated sputum-assessment     Status: None   Collection Time: 11/09/14  6:30 AM  Result Value Ref Range  Status   Specimen Description EXPECTORATED SPUTUM  Final   Special Requests Normal  Final   Sputum evaluation THIS SPECIMEN IS ACCEPTABLE FOR SPUTUM CULTURE  Final   Report Status 11/10/2014 FINAL  Final  Culture, respiratory (NON-Expectorated)     Status: None (Preliminary result)   Collection Time: 11/09/14  6:30 AM  Result Value Ref Range Status   Specimen Description EXPECTORATED SPUTUM  Final   Special Requests Normal Reflexed from [id number]  Final   Gram Stain   Final    FEW WBC SEEN FEW YEAST FEW GRAM POSITIVE COCCI GOOD SPECIMEN - 80-90% WBCS    Culture   Final    MODERATE GROWTH METHICILLIN RESISTANT STAPHYLOCOCCUS AUREUS CRITICAL RESULT CALLED TO, READ BACK BY AND VERIFIED WITH: MADDIE HINES AT 1143 11/11/14 DV MODERATE GROWTH YEAST IDENTIFICATION TO FOLLOW ONCE ISOLATED REDUCED NORMAL FLORA PRESENT    Report Status PENDING  Incomplete   Organism ID, Bacteria METHICILLIN RESISTANT STAPHYLOCOCCUS AUREUS  Final      Susceptibility   Methicillin resistant staphylococcus aureus - MIC*    CIPROFLOXACIN >=8 RESISTANT Resistant     GENTAMICIN <=0.5 SENSITIVE Sensitive     OXACILLIN >=4 RESISTANT Resistant     VANCOMYCIN <=0.5 SENSITIVE Sensitive     TRIMETH/SULFA <=10 SENSITIVE Sensitive     CEFOXITIN SCREEN Value in next row Resistant      POSITIVECEFOXITIN SCREEN - This test may be used to predict mecA-mediated oxacillin resistance, and it is based on the cefoxitin disk screen test.  The cefoxitin screen and oxacillin work in combination to determine the final interpretation reported for oxacillin.     Inducible Clindamycin Value in next row Sensitive      POSITIVECEFOXITIN SCREEN - This test may be used to predict mecA-mediated oxacillin resistance, and it is based on the cefoxitin disk screen test.  The cefoxitin screen and oxacillin work in combination to determine the final interpretation reported for oxacillin.     TETRACYCLINE Value in next row Sensitive      SENSITIVE<=1     * MODERATE GROWTH METHICILLIN RESISTANT STAPHYLOCOCCUS AUREUS     Studies: Dg Chest Port 1 View  11/10/2014   CLINICAL DATA:  Difficulty breathing  EXAM: PORTABLE CHEST - 1 VIEW  COMPARISON:  Radiograph 11/07/2014  FINDINGS: Normal cardiac silhouette. There are low lung volumes and bibasilar atelectasis. Mild central venous congestion. No pneumothorax.  IMPRESSION: No change from prior. Low lung volumes and central venous congestion.   Electronically Signed   By: Genevive Bi M.D.   On: 11/10/2014 09:33    Scheduled Meds: . acetaZOLAMIDE  250 mg Oral BID  .  albuterol  2.5 mg Nebulization Q4H  . antiseptic oral rinse  7 mL Mouth Rinse q12n4p  . apixaban  5 mg Oral BID  . budesonide (PULMICORT) nebulizer solution  0.5 mg Nebulization BID  . carvedilol  6.25 mg Oral BID WC  . chlorhexidine  15 mL Mouth Rinse BID  . diltiazem  60 mg Oral 3 times per day  . docusate sodium  200 mg Oral BID  . furosemide  60 mg Intravenous BID  . insulin aspart  0-15 Units Subcutaneous TID WC & HS  . insulin glargine  15 Units Subcutaneous QHS  . mupirocin ointment   Nasal BID  . potassium chloride  40 mEq Oral BID  . [START ON 11/12/2014] predniSONE  20 mg Oral Q breakfast  . sertraline  100 mg Oral Daily  . sodium chloride  3 mL Intravenous Q12H  . spironolactone  50 mg Oral Daily  . sulfamethoxazole-trimethoprim  20 mL Oral BID  . theophylline  200 mg Oral Q12H  . tiotropium  18 mcg Inhalation Daily  . traZODone  100 mg Oral QHS  . trimethoprim-polymyxin b  2 drop Left Eye 6 times per day    Assessment/Plan:  1. Acute on chronic respiratory failure with hypoxia. Continue oxygen supplementation 4 L 2. Acute on chronic combined systolic and diastolic congestive heart failure. Patient is on IV Lasix 60 mg twice a day. Also on spironolactone. Continue Coreg. Blood pressure too low this morning for lisinopril 3. Pneumonia and leukocytosis- MRSA in the sputum. DC Levaquin and continue  Bactrim 4. COPD exacerbation with Wheezes in the lungs- on prednisone oral taper and budesonide nebulizers. Patient has been very slow to progress with regards to her wheezing and symptoms. 5. Diabetes mellitus without complication- continue Lantus and sliding scale 6. Morbid obesity and sleep apnea- patient must lose weight. CPAP at night 7. Atrial fibrillation with rapid ventricular response on Coreg and diltiazem 8. Elevated troponin likely demand ischemia from rapid heart rate 9. Weakness- physical therapy evaluation  10. Thrombocytopenia- could be antibiotics related. Looking back at prior labs she has had low platelets in the past. Continue to monitor platelet trend. Check a hepatitis C antibody.  Code Status:     Code Status Orders        Start     Ordered   11/22/2014 0853  Do not attempt resuscitation (DNR)   Continuous    Question Answer Comment  In the event of cardiac or respiratory ARREST Do not call a "code blue"   In the event of cardiac or respiratory ARREST Do not perform Intubation, CPR, defibrillation or ACLS   In the event of cardiac or respiratory ARREST Use medication by any route, position, wound care, and other measures to relive pain and suffering. May use oxygen, suction and manual treatment of airway obstruction as needed for comfort.   Comments RN may pronounce death      November 22, 2014 1610    Advance Directive Documentation        Most Recent Value   Type of Advance Directive  Out of facility DNR (pink MOST or yellow form)   Pre-existing out of facility DNR order (yellow form or pink MOST form)  Yellow form placed in chart (order not valid for inpatient use)   "MOST" Form in Place?       Disposition Plan: May need rehabilitation   Consultants:  Cardiology   Time spent: 20 minutes  Alford Highland  [facility]

## 2014-11-12 LAB — GLUCOSE, CAPILLARY
GLUCOSE-CAPILLARY: 146 mg/dL — AB (ref 65–99)
GLUCOSE-CAPILLARY: 136 mg/dL — AB (ref 65–99)
Glucose-Capillary: 74 mg/dL (ref 65–99)
Glucose-Capillary: 135 mg/dL — ABNORMAL HIGH (ref 65–99)

## 2014-11-12 LAB — BASIC METABOLIC PANEL
Anion gap: 8 (ref 5–15)
BUN: 32 mg/dL — AB (ref 6–20)
CHLORIDE: 90 mmol/L — AB (ref 101–111)
CO2: 33 mmol/L — AB (ref 22–32)
CREATININE: 0.91 mg/dL (ref 0.44–1.00)
Calcium: 8.3 mg/dL — ABNORMAL LOW (ref 8.9–10.3)
GFR calc Af Amer: >60 mL/min (ref >60)
GFR calc non Af Amer: >60 mL/min (ref >60)
Glucose, Bld: 84 mg/dL (ref 65–99)
Potassium: 4.2 mmol/L (ref 3.5–5.1)
SODIUM: 131 mmol/L — AB (ref 135–145)

## 2014-11-12 LAB — PLATELET COUNT: Platelets: 46 10*3/uL — ABNORMAL LOW (ref 150–440)

## 2014-11-12 MED ORDER — FUROSEMIDE 10 MG/ML IJ SOLN
40.0000 mg | Freq: Two times a day (BID) | INTRAMUSCULAR | Status: DC
  Administered 2014-11-12 – 2014-11-13 (×2): 40 mg via INTRAVENOUS
  Filled 2014-11-12 (×2): qty 4

## 2014-11-12 MED ORDER — SODIUM CHLORIDE 0.9 % IV SOLN
Freq: Once | INTRAVENOUS | Status: AC
  Administered 2014-11-12: 13:00:00 via INTRAVENOUS

## 2014-11-12 NOTE — Progress Notes (Signed)
Patient ID: Eileen [name], female   DOB: 1946/01/21, 69 y.o.   MRN: [id number] [facility] PROGRESS NOTE  PCP: Laurier Nancy, MD  HPI/Subjective: Patient's shortness of breath is better l no not hypotensive today. Denies any dizziness or loss of consciousness .Still with cough and productive sputum  Objective: Filed Vitals:   11/12/14 1202  BP: 89/51  Pulse: 83  Temp: 97.7 F (36.5 C)  Resp: 20    Filed Weights   11/11/14 0418 11/11/14 0500 11/12/14 0422  Weight: 149.732 kg (330 lb 1.6 oz) 148.689 kg (327 lb 12.8 oz) 148.598 kg (327 lb 9.6 oz)    ROS: Review of Systems  Constitutional: Positive for malaise/fatigue. Negative for fever and chills.  Eyes: Negative for blurred vision.  Respiratory: Positive for cough, sputum production and shortness of breath.   Cardiovascular: Negative for chest pain.  Gastrointestinal: Negative for nausea, vomiting, abdominal pain, diarrhea and constipation.  Genitourinary: Negative for dysuria.  Musculoskeletal: Negative for joint pain.  Neurological: Positive for weakness. Negative for dizziness and headaches.   Exam: Physical Exam  Constitutional: She is oriented to person, place, and time.  HENT:  Nose: No mucosal edema.  Mouth/Throat: No oropharyngeal exudate or posterior oropharyngeal edema.  Eyes: Conjunctivae, EOM and lids are normal. Pupils are equal, round, and reactive to light.  Neck: No JVD present. Carotid bruit is not present. No edema present. No thyroid mass and no thyromegaly present.  Cardiovascular: S1 normal and S2 normal.  Exam reveals no gallop.   Murmur heard.  Systolic murmur is present with a grade of 2/6  Pulses:      Dorsalis pedis pulses are 2+ on the right side, and 2+ on the left side.  Respiratory: No respiratory distress. She has decreased breath sounds in the right lower field and the left lower field. She has wheezes in the right lower field. She has no rhonchi. She has no rales.  GI:  Soft. Bowel sounds are normal. There is no tenderness.  Musculoskeletal:       Right ankle: She exhibits swelling.       Left ankle: She exhibits swelling.  Lymphadenopathy:    She has no cervical adenopathy.  Neurological: She is alert and oriented to person, place, and time. No cranial nerve deficit.  Skin: Skin is warm. Nails show no clubbing.  Chronic lower extremity discoloration bilaterally  Psychiatric: She has a normal mood and affect.    Data Reviewed: Basic Metabolic Panel:  Recent Labs Lab 11/06/14 0421 11/07/14 0457 11/08/14 0405 11/09/14 0354 11/11/14 0432 11/12/14 0531  NA 132* 131* 132* 133* 131* 131*  K 3.9 3.5 4.2 4.0 3.0* 4.2  CL 88* 87* 88* 89* 85* 90*  CO2 34* 36* 34* 36* 35* 33*  GLUCOSE 179* 185* 196* 149* 100* 84  BUN 26* 36* 43* 43* 36* 32*  CREATININE 0.85 0.90 1.02* 0.98 0.99 0.91  CALCIUM 8.3* 8.5* 8.4* 8.5* 8.2* 8.3*  MG 2.0 1.9  --   --   --   --   PHOS 3.9 4.0  --   --   --   --    CBC:  Recent Labs Lab 11/06/14 0421 11/10/14 0357 11/11/14 0432 11/12/14 0531  WBC 7.8 10.2 10.4  --   HGB 11.5* 12.2 12.5  --   HCT 36.4 37.5 37.5  --   MCV 89.4 88.6 87.5  --   PLT 135* 85* 59* 46*     CBG:  Recent Labs Lab  11/11/14 1116 11/11/14 1614 11/11/14 2039 11/12/14 0748 11/12/14 1159  GLUCAP 133* 159* 146* 74 135*    Recent Results (from the past 240 hour(s))  MRSA PCR Screening     Status: None   Collection Time: 11/03/14  9:41 AM  Result Value Ref Range Status   MRSA by PCR NEGATIVE NEGATIVE Final    Comment:        The GeneXpert MRSA Assay (FDA approved for NASAL specimens only), is one component of a comprehensive MRSA colonization surveillance program. It is not intended to diagnose MRSA infection nor to guide or monitor treatment for MRSA infections.   Culture, expectorated sputum-assessment     Status: None   Collection Time: 11/09/14  6:30 AM  Result Value Ref Range Status   Specimen Description EXPECTORATED  SPUTUM  Final   Special Requests Normal  Final   Sputum evaluation THIS SPECIMEN IS ACCEPTABLE FOR SPUTUM CULTURE  Final   Report Status 11/10/2014 FINAL  Final  Culture, respiratory (NON-Expectorated)     Status: None (Preliminary result)   Collection Time: 11/09/14  6:30 AM  Result Value Ref Range Status   Specimen Description EXPECTORATED SPUTUM  Final   Special Requests Normal Reflexed from [id number]  Final   Gram Stain   Final    FEW WBC SEEN FEW YEAST FEW GRAM POSITIVE COCCI GOOD SPECIMEN - 80-90% WBCS    Culture   Final    MODERATE GROWTH METHICILLIN RESISTANT STAPHYLOCOCCUS AUREUS CRITICAL RESULT CALLED TO, READ BACK BY AND VERIFIED WITH: MADDIE HINES AT 1143 11/11/14 DV MODERATE GROWTH YEAST IDENTIFICATION TO FOLLOW ONCE ISOLATED REDUCED NORMAL FLORA PRESENT    Report Status PENDING  Incomplete   Organism ID, Bacteria METHICILLIN RESISTANT STAPHYLOCOCCUS AUREUS  Final      Susceptibility   Methicillin resistant staphylococcus aureus - MIC*    CIPROFLOXACIN >=8 RESISTANT Resistant     GENTAMICIN <=0.5 SENSITIVE Sensitive     OXACILLIN >=4 RESISTANT Resistant     VANCOMYCIN <=0.5 SENSITIVE Sensitive     TRIMETH/SULFA <=10 SENSITIVE Sensitive     CEFOXITIN SCREEN Value in next row Resistant      POSITIVECEFOXITIN SCREEN - This test may be used to predict mecA-mediated oxacillin resistance, and it is based on the cefoxitin disk screen test.  The cefoxitin screen and oxacillin work in combination to determine the final interpretation reported for oxacillin.     Inducible Clindamycin Value in next row Sensitive      POSITIVECEFOXITIN SCREEN - This test may be used to predict mecA-mediated oxacillin resistance, and it is based on the cefoxitin disk screen test.  The cefoxitin screen and oxacillin work in combination to determine the final interpretation reported for oxacillin.     TETRACYCLINE Value in next row Sensitive      SENSITIVE<=1    * MODERATE GROWTH METHICILLIN RESISTANT  STAPHYLOCOCCUS AUREUS     Studies: No results found.  Scheduled Meds: . sodium chloride   Intravenous Once  . albuterol  2.5 mg Nebulization Q4H  . antiseptic oral rinse  7 mL Mouth Rinse q12n4p  . apixaban  5 mg Oral BID  . budesonide (PULMICORT) nebulizer solution  0.5 mg Nebulization BID  . carvedilol  6.25 mg Oral BID WC  . chlorhexidine  15 mL Mouth Rinse BID  . docusate sodium  200 mg Oral BID  . furosemide  40 mg Intravenous BID  . insulin aspart  0-15 Units Subcutaneous TID WC & HS  . insulin glargine  15 Units Subcutaneous QHS  . mupirocin ointment   Nasal BID  . potassium chloride  40 mEq Oral BID  . predniSONE  20 mg Oral Q breakfast  . sertraline  100 mg Oral Daily  . sodium chloride  3 mL Intravenous Q12H  . spironolactone  50 mg Oral Daily  . sulfamethoxazole-trimethoprim  20 mL Oral BID  . theophylline  200 mg Oral Q12H  . tiotropium  18 mcg Inhalation Daily  . traZODone  100 mg Oral QHS  . trimethoprim-polymyxin b  2 drop Left Eye 6 times per day    Assessment/Plan:  1. Acute on chronic respiratory failure with hypoxia. Continue oxygen supplementation 4 L, CPAP daily at bedtime 2. Acute on chronic combined systolic and diastolic congestive heart failure. Patient is on IV Lasix 60 mg twice a day. Also on spironolactone. Continue Coreg. Blood pressure too low this morning for lisinopril, we will reduce Lasix dose to 40 mg twice a day in view of persistent hypotension .we will give 500 cc of fluid  3. Pneumonia and leukocytosis- MRSA in the sputum. DC Levaquin and continue Bactrim 4. COPD exacerbation with Wheezes in the lungs- on prednisone oral taper and budesonide nebulizers. Patient has been very slow to progress with regards to her wheezing and symptoms. 5. Diabetes mellitus without complication- continue Lantus and sliding scale 6. Morbid obesity and sleep apnea- patient must lose weight. CPAP at night 7. Atrial fibrillation with rapid ventricular response on  Coreg and diltiazem 8. Elevated troponin likely demand ischemia from rapid heart rate 9. Weakness- physical therapy evaluation , patient is not cooperating with physical therapy  Thrombocytopenia- could be antibiotics related. Looking back at prior labs she has had low platelets in the past. Continue to monitor platelet trend. Check a hepatitis C   Disposition- possibly to skilled nursing care. PT consult is pending as patient is not cooperative with physical therapy  Code Status:     Code Status Orders        Start     Ordered   2014-11-20 0853  Do not attempt resuscitation (DNR)   Continuous    Question Answer Comment  In the event of cardiac or respiratory ARREST Do not call a "code blue"   In the event of cardiac or respiratory ARREST Do not perform Intubation, CPR, defibrillation or ACLS   In the event of cardiac or respiratory ARREST Use medication by any route, position, wound care, and other measures to relive pain and suffering. May use oxygen, suction and manual treatment of airway obstruction as needed for comfort.   Comments RN may pronounce death      11/20/2014 4098    Advance Directive Documentation        Most Recent Value   Type of Advance Directive  Out of facility DNR (pink MOST or yellow form)   Pre-existing out of facility DNR order (yellow form or pink MOST form)  Yellow form placed in chart (order not valid for inpatient use)   "MOST" Form in Place?       Disposition Plan: May need rehabilitation   Consultants:  Cardiology   Time spent: 35  minutes  Chailyn Racette  [facility]

## 2014-11-12 NOTE — Progress Notes (Signed)
Subjective: pt remains SOB with productive cough.    Filed Vitals:   11/11/14 2036 11/12/14 0422 11/12/14 0828 11/12/14 0952  BP: 109/46 101/79  97/40  Pulse: 69 94  87  Temp:  97.7 F (36.5 C)    TempSrc:  Oral    Resp:  20  20  Height:      Weight:  148.598 kg (327 lb 9.6 oz)    SpO2: 96% 93% 91% 98%    Intake/Output Summary (Last 24 hours) at 11/12/14 1038 Last data filed at 11/12/14 0957  Gross per 24 hour  Intake    303 ml  Output   1950 ml  Net  -1647 ml    LABS: Basic Metabolic Panel:  Recent Labs  16/10/96 0432 11/12/14 0531  NA 131* 131*  K 3.0* 4.2  CL 85* 90*  CO2 35* 33*  GLUCOSE 100* 84  BUN 36* 32*  CREATININE 0.99 0.91  CALCIUM 8.2* 8.3*   Liver Function Tests: No results for input(s): AST, ALT, ALKPHOS, BILITOT, PROT, ALBUMIN in the last 72 hours. No results for input(s): LIPASE, AMYLASE in the last 72 hours. CBC:  Recent Labs  11/10/14 0357 11/11/14 0432 11/12/14 0531  WBC 10.2 10.4  --   HGB 12.2 12.5  --   HCT 37.5 37.5  --   MCV 88.6 87.5  --   PLT 85* 59* 46*   Cardiac Enzymes: No results for input(s): CKTOTAL, CKMB, CKMBINDEX, TROPONINI in the last 72 hours. BNP: Invalid input(s): POCBNP D-Dimer: No results for input(s): DDIMER in the last 72 hours. Hemoglobin A1C: No results for input(s): HGBA1C in the last 72 hours. Fasting Lipid Panel: No results for input(s): CHOL, HDL, LDLCALC, TRIG, CHOLHDL, LDLDIRECT in the last 72 hours. Thyroid Function Tests: No results for input(s): TSH, T4TOTAL, T3FREE, THYROIDAB in the last 72 hours.  Invalid input(s): FREET3 Anemia Panel: No results for input(s): VITAMINB12, FOLATE, FERRITIN, TIBC, IRON, RETICCTPCT in the last 72 hours.   PHYSICAL EXAM General: obese, critically ill appearing HEENT: Normocephalic and atramatic Neck: No JVD.  Lungs: rhochi b/l  Heart: irregularly irregular  Abdomen: Bowel sounds are positive, abdomen soft and non-tender  Msk: Back normal, normal  gait. Normal strength and tone for age. Extremities: trace edema b/l.  Neuro: Alert and oriented X 3. Psych: Good affect, responds appropriately   TELEMETRY: Reviewed telemetry pt in a-fib VR in 80s  ASSESSMENT AND PLAN:  acute respiratory distress 2/2 pulmonary edema vs PNA. Echo shows EF 40%, grade 1 diastolic dysfunction, mild MR, mild pulmonary HTN. Advise continuing carvedilol 6.25mg  BID, lasix, and spironolactone for diuresis. Spoke with pts husband regarding cardiac issues and urged him to have her f/u as outpatient.   -A-fib now rate controlled and protected with eliquis. Urged pt to wear CPAP once back at rehab facility as many of these cardio-pulmonary issues are likely 2/2 non-compliance with CPAP.   Patient and plan discussed with supervising provider, Dr. Adrian Blackwater, who agrees with above findings.   Alinda Sierras Margarito Courser [facility]  11/12/2014 10:38 AM

## 2014-11-12 NOTE — Progress Notes (Signed)
Alert and oriented. Patient states her breathing has improved. No complaints of pain. Afib, rate controlled on tele. Still on 4L of oxygen, chronic. Blood pressure has improved, 500cc fluids currently infusing. Night dose of coreg and lasix were given. Will continue to monitor.

## 2014-11-12 NOTE — Progress Notes (Signed)
Notified Dr. Amado Coe that patient's blood pressure has been running low, last reading 89/51. Notified that she did not get her coreg or lasix this AM. MD stated to give a 500cc bolus at 78mL/hr once.

## 2014-11-12 NOTE — Care Management Important Message (Signed)
Important Message  Patient Details  Name: OCTAVIE WESTERHOLD MRN: [id number] Date of Birth: [birth year]   Medicare Important Message Given:  Yes-second notification given    Eber Hong, RN 11/12/2014, 8:05 AM

## 2014-11-12 NOTE — Care Management (Signed)
Barriers to discharge are low blood pressure requiring blood pressure meds to be on hold.  Continues to require IV Lasix. Continue to anticipate discharge to a skilled nursing facility  She declined to participate with physical therapy 11/09/2014

## 2014-11-13 LAB — GLUCOSE, CAPILLARY
GLUCOSE-CAPILLARY: 54 mg/dL — AB (ref 65–99)
GLUCOSE-CAPILLARY: 137 mg/dL — AB (ref 65–99)
GLUCOSE-CAPILLARY: 199 mg/dL — AB (ref 65–99)
Glucose-Capillary: 74 mg/dL (ref 65–99)

## 2014-11-13 MED ORDER — INSULIN GLARGINE 100 UNIT/ML ~~LOC~~ SOLN: 15.0000 [IU] | Freq: Every day | SUBCUTANEOUS | Status: AC

## 2014-11-13 MED ORDER — CETYLPYRIDINIUM CHLORIDE 0.05 % MT LIQD: 7.0000 mL | Freq: Two times a day (BID) | OROMUCOSAL | Status: DC

## 2014-11-13 MED ORDER — SPIRONOLACTONE 50 MG PO TABS: 50.0000 mg | ORAL_TABLET | Freq: Every day | ORAL | Status: DC

## 2014-11-13 MED ORDER — CARVEDILOL 6.25 MG PO TABS: 6.2500 mg | ORAL_TABLET | Freq: Two times a day (BID) | ORAL | Status: DC

## 2014-11-13 MED ORDER — ALBUTEROL SULFATE (2.5 MG/3ML) 0.083% IN NEBU: 2.5000 mg | INHALATION_SOLUTION | Freq: Three times a day (TID) | RESPIRATORY_TRACT | Status: DC

## 2014-11-13 MED ORDER — GUAIFENESIN-CODEINE 100-10 MG/5ML PO SOLN: 10.0000 mL | Freq: Four times a day (QID) | ORAL | Status: DC | PRN

## 2014-11-13 MED ORDER — INSULIN ASPART 100 UNIT/ML ~~LOC~~ SOLN: 0.0000 [IU] | Freq: Three times a day (TID) | SUBCUTANEOUS | Status: DC

## 2014-11-13 MED ORDER — DOCUSATE SODIUM 100 MG PO CAPS: 200.0000 mg | ORAL_CAPSULE | Freq: Two times a day (BID) | ORAL | Status: AC

## 2014-11-13 MED ORDER — FUROSEMIDE 40 MG PO TABS: 40.0000 mg | ORAL_TABLET | Freq: Two times a day (BID) | ORAL | Status: AC

## 2014-11-13 MED ORDER — MUPIROCIN 2 % EX OINT: TOPICAL_OINTMENT | Freq: Two times a day (BID) | CUTANEOUS | Status: DC

## 2014-11-13 MED ORDER — CHLORHEXIDINE GLUCONATE 0.12 % MT SOLN: 15.0000 mL | Freq: Two times a day (BID) | OROMUCOSAL | Status: DC

## 2014-11-13 MED ORDER — ONDANSETRON HCL 4 MG PO TABS: 4.0000 mg | ORAL_TABLET | Freq: Four times a day (QID) | ORAL | Status: DC | PRN

## 2014-11-13 MED ORDER — BISACODYL 10 MG RE SUPP: 10.0000 mg | Freq: Every day | RECTAL | Status: DC | PRN

## 2014-11-13 MED ORDER — APIXABAN 5 MG PO TABS: 5.0000 mg | ORAL_TABLET | Freq: Two times a day (BID) | ORAL | Status: DC

## 2014-11-13 MED ORDER — SULFAMETHOXAZOLE-TRIMETHOPRIM 200-40 MG/5ML PO SUSP: 20.0000 mL | Freq: Two times a day (BID) | ORAL | Status: DC

## 2014-11-13 MED ORDER — POLYMYXIN B-TRIMETHOPRIM 10000-0.1 UNIT/ML-% OP SOLN: 2.0000 [drp] | OPHTHALMIC | Status: DC

## 2014-11-13 MED ORDER — BUDESONIDE 0.5 MG/2ML IN SUSP: 0.5000 mg | Freq: Two times a day (BID) | RESPIRATORY_TRACT | Status: DC

## 2014-11-13 MED ORDER — POTASSIUM CHLORIDE CRYS ER 20 MEQ PO TBCR: 20.0000 meq | EXTENDED_RELEASE_TABLET | Freq: Two times a day (BID) | ORAL | Status: AC

## 2014-11-13 MED ORDER — ALBUTEROL SULFATE (2.5 MG/3ML) 0.083% IN NEBU
2.5000 mg | INHALATION_SOLUTION | Freq: Three times a day (TID) | RESPIRATORY_TRACT | Status: DC
  Administered 2014-11-13: 2.5 mg via RESPIRATORY_TRACT
  Filled 2014-11-13: qty 3

## 2014-11-13 NOTE — Discharge Instructions (Signed)
[facility] appointment on November 26, 2014 at 10:00am with Clarisa Kindred, FNP. Please call [phone] to reschedule.  PT Use oxygen and CPAP as before

## 2014-11-13 NOTE — Progress Notes (Signed)
Patient is ready for transport. EMS has been called. IV removed. Telemetry box will be removed when EMS arrives. Packet is ready to go from social work. Patient educated and has no questions at this time.

## 2014-11-13 NOTE — Progress Notes (Signed)
   SUBJECTIVE: Pt states she is feeling slightly better. Continued cough and SOB.   Filed Vitals:   11/12/14 2321 11/13/14 0413 11/13/14 0623 11/13/14 0830  BP:   95/58 91/65  Pulse:   79 90  Temp:   97.6 F (36.4 C)   TempSrc:   Oral   Resp:   20   Height:      Weight:   151.501 kg (334 lb)   SpO2: 95% 96% 100%     Intake/Output Summary (Last 24 hours) at 11/13/14 1126 Last data filed at 11/13/14 0625  Gross per 24 hour  Intake    440 ml  Output   1700 ml  Net  -1260 ml    LABS: Basic Metabolic Panel:  Recent Labs  16/10/96 0432 11/12/14 0531  NA 131* 131*  K 3.0* 4.2  CL 85* 90*  CO2 35* 33*  GLUCOSE 100* 84  BUN 36* 32*  CREATININE 0.99 0.91  CALCIUM 8.2* 8.3*   Liver Function Tests: No results for input(s): AST, ALT, ALKPHOS, BILITOT, PROT, ALBUMIN in the last 72 hours. No results for input(s): LIPASE, AMYLASE in the last 72 hours. CBC:  Recent Labs  11/11/14 0432 11/12/14 0531  WBC 10.4  --   HGB 12.5  --   HCT 37.5  --   MCV 87.5  --   PLT 59* 46*   Cardiac Enzymes: No results for input(s): CKTOTAL, CKMB, CKMBINDEX, TROPONINI in the last 72 hours. BNP: Invalid input(s): POCBNP D-Dimer: No results for input(s): DDIMER in the last 72 hours. Hemoglobin A1C: No results for input(s): HGBA1C in the last 72 hours. Fasting Lipid Panel: No results for input(s): CHOL, HDL, LDLCALC, TRIG, CHOLHDL, LDLDIRECT in the last 72 hours. Thyroid Function Tests: No results for input(s): TSH, T4TOTAL, T3FREE, THYROIDAB in the last 72 hours.  Invalid input(s): FREET3 Anemia Panel: No results for input(s): VITAMINB12, FOLATE, FERRITIN, TIBC, IRON, RETICCTPCT in the last 72 hours.   PHYSICAL EXAM General: obese, critically ill appearing HEENT: Normocephalic and atramatic Neck: No JVD.  Lungs: rhochi b/l  Heart: irregularly irregular  Abdomen: Bowel sounds are positive, abdomen soft and non-tender  Msk: Back normal, normal gait. Normal strength and  tone for age. Extremities: trace edema b/l.  Neuro: Alert and oriented X 3. Psych: Good affect, responds appropriately  TELEMETRY: Reviewed telemetry pt in a-fib VR in 80s  ASSESSMENT AND PLAN:  acute respiratory distress 2/2 pulmonary edema vs PNA. Echo shows EF 40%, grade 1 diastolic dysfunction, mild MR, mild pulmonary HTN. Advise continuing carvedilol 6.25mg  BID, lasix, and spironolactone for diuresis. Spoke with pts husband regarding cardiac issues and urged him to have her f/u as outpatient.   -A-fib now rate controlled and protected with eliquis. Urged pt to wear CPAP once back at rehab facility as many of these cardio-pulmonary issues are likely 2/2 non-compliance with CPAP.   Pt given f/u 9/13 at 2pm in our office.   Patient and plan discussed with supervising provider, Dr. Adrian Blackwater, who agrees with above findings.   Eileen [name] Courser [facility]  11/13/2014 11:26 AM

## 2014-11-13 NOTE — Clinical Social Work Note (Addendum)
CSW was notified that PT was notified for updated PT notes.  CSW will continue to follow/

## 2014-11-13 NOTE — Discharge Summary (Addendum)
[facility] - [place] at [facility]   PATIENT NAME: Eileen [name]    MR#:  [id number]  DATE OF BIRTH:  [birth year]  DATE OF ADMISSION:  11/03/2014 ADMITTING PHYSICIAN: Crissie Figures, MD  DATE OF DISCHARGE: 11/13/14  PRIMARY CARE PHYSICIAN: Laurier Nancy, MD    ADMISSION DIAGNOSIS:  NSTEMI (non-ST elevated myocardial infarction) [I21.4] Acute exacerbation of CHF (congestive heart failure) [I50.9] Atrial fibrillation with rapid ventricular response [I48.91]  DISCHARGE DIAGNOSIS:  Acute on chronic respiratory failure due to COPD falre and acute on chronic diastolic/systolic CHF MRSA pneumonia OSA DM-2 Morbid obesity Chronic foley catheter SECONDARY DIAGNOSIS:   Past Medical History  Diagnosis Date  . COPD (chronic obstructive pulmonary disease)   . Arthritis   . Hypertension   . Diabetes mellitus without complication   . CHF (congestive heart failure)   . Sleep apnea   . Depression     HOSPITAL COURSE:   1. Acute on chronic respiratory failure with hypoxia. Continue oxygen supplementation 4 L, CPAP daily at bedtime. Pt appears at baseline 2. Acute on chronic combined systolic and diastolic congestive heart failure. Change to po asix 40 mg twice a day. Also on spironolactone. Continue Coreg. Blood pressure stays on  low side. Pt asymtpomatic cont cardiac meds. Spoke with Dr Welton Flakes. No further cardiac diagnostics recommended. Ok from his standpoint for d/c to rehab 3. Pneumonia and leukocytosis- MRSA in the sputum. DC Levaquin and continue Bactrim 4. COPD exacerbation with Wheezes in the lungs- on prednisone oral taper and budesonide nebulizers. Patient has been very slow to progress with regards to her wheezing and symptoms. 5. Diabetes mellitus without complication- continue Lantus and sliding scale 6. Morbid obesity and sleep apnea- patient must lose weight. CPAP at night 7. Atrial fibrillation with rapid ventricular response on Coreg and  diltiazem 8. Elevated troponin likely demand ischemia from rapid heart rate 9. Weakness- physical therapy evaluation , patient is not cooperating with physical therapy  Please note. Pt has had chronic foley for long time. Foley care per protocol DISCHARGE CONDITIONS:   At present stable. Long term poor CONSULTS OBTAINED:  Treatment Team:  Laurier Nancy, MD Ramonita Lab, MD  DRUG ALLERGIES:   Allergies  Allergen Reactions  . Other Other (See Comments)    darvocet--pt reports blistering with this medication  . Penicillins Nausea And Vomiting  . Darvon [Propoxyphene] Other (See Comments)    Blisters    DISCHARGE MEDICATIONS:   Current Discharge Medication List    START taking these medications   Details  antiseptic oral rinse (CPC / CETYLPYRIDINIUM CHLORIDE 0.05%) 0.05 % LIQD solution 7 mLs by Mouth Rinse route 2 times daily at 12 noon and 4 pm. Qty: 15 mL, Refills: 0    budesonide (PULMICORT) 0.5 MG/2ML nebulizer solution Take 2 mLs (0.5 mg total) by nebulization 2 (two) times daily. Qty: 15 mL, Refills: 0    carvedilol (COREG) 6.25 MG tablet Take 1 tablet (6.25 mg total) by mouth 2 (two) times daily with a meal. Qty: 60 tablet, Refills: 0    chlorhexidine (PERIDEX) 0.12 % solution 15 mLs by Mouth Rinse route 2 (two) times daily. Qty: 120 mL, Refills: 0    guaiFENesin-codeine 100-10 MG/5ML syrup Take 10 mLs by mouth every 6 (six) hours as needed for cough. Qty: 120 mL, Refills: 0    !! insulin aspart (NOVOLOG) 100 UNIT/ML injection Inject 0-15 Units into the skin 4 (four) times daily -  with meals and at bedtime. Qty:  10 mL, Refills: 11    mupirocin ointment (BACTROBAN) 2 % Place into the nose 2 (two) times daily. Qty: 22 g, Refills: 0    ondansetron (ZOFRAN) 4 MG tablet Take 1 tablet (4 mg total) by mouth every 6 (six) hours as needed for nausea. Qty: 20 tablet, Refills: 0    potassium chloride SA (K-DUR,KLOR-CON) 20 MEQ tablet Take 1 tablet (20 mEq total) by  mouth 2 (two) times daily. Qty: 60 tablet, Refills: 0    spironolactone (ALDACTONE) 50 MG tablet Take 1 tablet (50 mg total) by mouth daily. Qty: 30 tablet, Refills: 0    sulfamethoxazole-trimethoprim (BACTRIM,SEPTRA) 200-40 MG/5ML suspension Take 20 mLs by mouth 2 (two) times daily. Qty: 100 mL, Refills: 0    trimethoprim-polymyxin b (POLYTRIM) ophthalmic solution Place 2 drops into the left eye every 4 (four) hours. Qty: 10 mL, Refills: 0     !! - Potential duplicate medications found. Please discuss with provider.    CONTINUE these medications which have CHANGED   Details  albuterol (PROVENTIL) (2.5 MG/3ML) 0.083% nebulizer solution Take 3 mLs (2.5 mg total) by nebulization 3 (three) times daily. Qty: 75 mL, Refills: 12    apixaban (ELIQUIS) 5 MG TABS tablet Take 1 tablet (5 mg total) by mouth 2 (two) times daily. Qty: 60 tablet, Refills: 0    bisacodyl (DULCOLAX) 10 MG suppository Place 1 suppository (10 mg total) rectally daily as needed for moderate constipation. Qty: 12 suppository, Refills: 0    docusate sodium (COLACE) 100 MG capsule Take 2 capsules (200 mg total) by mouth 2 (two) times daily. Qty: 10 capsule, Refills: 0    furosemide (LASIX) 40 MG tablet Take 1 tablet (40 mg total) by mouth 2 (two) times daily. Qty: 60 tablet, Refills: 0    insulin glargine (LANTUS) 100 UNIT/ML injection Inject 0.15 mLs (15 Units total) into the skin at bedtime. Qty: 10 mL, Refills: 11      CONTINUE these medications which have NOT CHANGED   Details  !! insulin aspart (NOVOLOG) 100 UNIT/ML injection Inject 0-20 Units into the skin 3 (three) times daily with meals. Qty: 10 mL, Refills: 11    sertraline (ZOLOFT) 50 MG tablet Take 100 mg by mouth daily.    theophylline (THEODUR) 200 MG 12 hr tablet Take 1 tablet (200 mg total) by mouth every 12 (twelve) hours.    tiotropium (SPIRIVA) 18 MCG inhalation capsule Place 1 capsule (18 mcg total) into inhaler and inhale daily. Qty: 30  capsule, Refills: 12    traZODone (DESYREL) 100 MG tablet Take 100 mg by mouth at bedtime.    !! insulin aspart (NOVOLOG) 100 UNIT/ML injection Inject 0-5 Units into the skin at bedtime. Qty: 10 mL, Refills: 11     !! - Potential duplicate medications found. Please discuss with provider.    STOP taking these medications     acetaZOLAMIDE (DIAMOX) 250 MG tablet         If you experience worsening of your admission symptoms, develop shortness of breath, life threatening emergency, suicidal or homicidal thoughts you must seek medical attention immediately by calling 911 or calling your MD immediately  if symptoms less severe.  You Must read complete instructions/literature along with all the possible adverse reactions/side effects for all the Medicines you take and that have been prescribed to you. Take any new Medicines after you have completely understood and accept all the possible adverse reactions/side effects.   Please note  You were cared for by a  hospitalist during your hospital stay. If you have any questions about your discharge medications or the care you received while you were in the hospital after you are discharged, you can call the unit and asked to speak with the hospitalist on call if the hospitalist that took care of you is not available. Once you are discharged, your primary care physician will handle any further medical issues. Please note that NO REFILLS for any discharge medications will be authorized once you are discharged, as it is imperative that you return to your primary care physician (or establish a relationship with a primary care physician if you do not have one) for your aftercare needs so that they can reassess your need for medications and monitor your lab values. Today   SUBJECTIVE  Overall better  VITAL SIGNS:  Blood pressure 91/65, pulse 90, temperature 97.6 F (36.4 C), temperature source Oral, resp. rate 20, height 4\' 11"  (1.499 m), weight 151.501  kg (334 lb), SpO2 100 %.  I/O:    Intake/Output Summary (Last 24 hours) at 11/13/14 0953 Last data filed at 11/13/14 0625  Gross per 24 hour  Intake    443 ml  Output   1700 ml  Net  -1257 ml    PHYSICAL EXAMINATION:  GENERAL:  69 y.o.-year-old patient lying in the bed with no acute distress. Morbid obeisty EYES: Pupils equal, round, reactive to light and accommodation. No scleral icterus. Extraocular muscles intact.  HEENT: Head atraumatic, normocephalic. Oropharynx and nasopharynx clear.  NECK:  Supple, no jugular venous distention. No thyroid enlargement, no tenderness.  LUNGS: decreased breath sounds bilaterally, no wheezing, rales,rhonchi or crepitation. No use of accessory muscles of respiration.  CARDIOVASCULAR: S1, S2 normal. No murmurs, rubs, or gallops.  ABDOMEN: Soft, non-tender, non-distended. Bowel sounds present. No organomegaly or mass. Severe obesity, chronic foley EXTREMITIES: chronic ++++pedal edema, cyanosis, or clubbing.  NEUROLOGIC: Cranial nerves II through XII are intact. Muscle strength 4/5 in all extremities. Sensation intact. Gait not checked.  PSYCHIATRIC: The patient is alert and oriented x 3.  SKIN: No obvious rash, lesion, or ulcer. Chronic venous stasis changes  DATA REVIEW:   CBC   Recent Labs Lab 11/11/14 0432 11/12/14 0531  WBC 10.4  --   HGB 12.5  --   HCT 37.5  --   PLT 59* 46*    Chemistries   Recent Labs Lab 11/07/14 0457  11/12/14 0531  NA 131*  < > 131*  K 3.5  < > 4.2  CL 87*  < > 90*  CO2 36*  < > 33*  GLUCOSE 185*  < > 84  BUN 36*  < > 32*  CREATININE 0.90  < > 0.91  CALCIUM 8.5*  < > 8.3*  MG 1.9  --   --   < > = values in this interval not displayed.  Microbiology Results   Recent Results (from the past 240 hour(s))  Culture, expectorated sputum-assessment     Status: None   Collection Time: 11/09/14  6:30 AM  Result Value Ref Range Status   Specimen Description EXPECTORATED SPUTUM  Final   Special Requests  Normal  Final   Sputum evaluation THIS SPECIMEN IS ACCEPTABLE FOR SPUTUM CULTURE  Final   Report Status 11/10/2014 FINAL  Final  Culture, respiratory (NON-Expectorated)     Status: None (Preliminary result)   Collection Time: 11/09/14  6:30 AM  Result Value Ref Range Status   Specimen Description EXPECTORATED SPUTUM  Final   Special Requests  Normal Reflexed from [id number]  Final   Gram Stain   Final    FEW WBC SEEN FEW YEAST FEW GRAM POSITIVE COCCI GOOD SPECIMEN - 80-90% WBCS    Culture   Final    MODERATE GROWTH METHICILLIN RESISTANT STAPHYLOCOCCUS AUREUS CRITICAL RESULT CALLED TO, READ BACK BY AND VERIFIED WITH: MADDIE HINES AT 1143 11/11/14 DV MODERATE GROWTH YEAST IDENTIFICATION TO FOLLOW ONCE ISOLATED REDUCED NORMAL FLORA PRESENT    Report Status PENDING  Incomplete   Organism ID, Bacteria METHICILLIN RESISTANT STAPHYLOCOCCUS AUREUS  Final      Susceptibility   Methicillin resistant staphylococcus aureus - MIC*    CIPROFLOXACIN >=8 RESISTANT Resistant     GENTAMICIN <=0.5 SENSITIVE Sensitive     OXACILLIN >=4 RESISTANT Resistant     VANCOMYCIN <=0.5 SENSITIVE Sensitive     TRIMETH/SULFA <=10 SENSITIVE Sensitive     CEFOXITIN SCREEN Value in next row Resistant      POSITIVECEFOXITIN SCREEN - This test may be used to predict mecA-mediated oxacillin resistance, and it is based on the cefoxitin disk screen test.  The cefoxitin screen and oxacillin work in combination to determine the final interpretation reported for oxacillin.     Inducible Clindamycin Value in next row Sensitive      POSITIVECEFOXITIN SCREEN - This test may be used to predict mecA-mediated oxacillin resistance, and it is based on the cefoxitin disk screen test.  The cefoxitin screen and oxacillin work in combination to determine the final interpretation reported for oxacillin.     TETRACYCLINE Value in next row Sensitive      SENSITIVE<=1    * MODERATE GROWTH METHICILLIN RESISTANT STAPHYLOCOCCUS AUREUS     RADIOLOGY:  No results found.   Management plans discussed with the patient, family and they are in agreement.  CODE STATUS:     Code Status Orders        Start     Ordered   11/07/2014 [phone]  Do not attempt resuscitation (DNR)   Continuous    Question Answer Comment  In the event of cardiac or respiratory ARREST Do not call a "code blue"   In the event of cardiac or respiratory ARREST Do not perform Intubation, CPR, defibrillation or ACLS   In the event of cardiac or respiratory ARREST Use medication by any route, position, wound care, and other measures to relive pain and suffering. May use oxygen, suction and manual treatment of airway obstruction as needed for comfort.   Comments RN may pronounce death      11-07-14 0981    Advance Directive Documentation        Most Recent Value   Type of Advance Directive  Out of facility DNR (pink MOST or yellow form)   Pre-existing out of facility DNR order (yellow form or pink MOST form)  Yellow form placed in chart (order not valid for inpatient use)   "MOST" Form in Place?        TOTAL TIME TAKING CARE OF THIS PATIENT: 40 minutes.    Wahneta Derocher M.D on 11/13/2014 at 9:53 AM  Between 7am to 6pm - Pager - [phone] After 6pm go to [email or web address] - password [email or web address] [facility]  [facility]  Office  [phone]  CC: Primary care physician; Laurier Nancy, MD

## 2014-11-13 NOTE — Progress Notes (Signed)
Physical Therapy Treatment Patient Details Name: Eileen [name] MRN: [id number] DOB: March 05, 1946 Today's Date: 11/13/2014    History of Present Illness presented to ER secondary to respiratory distress, hypoxia; admitted with acute respiratory failure secondary to pulmonary edema, possible PNA.  Hospital course also significant for Afib with RVR (rates now in 70-80s).  Currently on 5L O2 via [place].    PT Comments    Patient able to complete supine/sit this date, though requiring total assist +3 people to complete.  Constant verbal encouragement required for participation with all therex; remains rather disinterested in therapy and in mobility progression.   Follow Up Recommendations  SNF     Equipment Recommendations       Recommendations for Other Services       Precautions / Restrictions Precautions Precautions: Fall Precaution Comments: contact, droplet isolation Restrictions Weight Bearing Restrictions: No    Mobility  Bed Mobility Overal bed mobility: Needs Assistance;+2 for physical assistance Bed Mobility: Supine to Sit;Sit to Supine     Supine to sit: +2 for physical assistance;Total assist Sit to supine: +2 for physical assistance;Total assist   General bed mobility comments: total assist of +3 for all portions of bed mobility; constant verbal encouragement for full, active efforts  Transfers                 General transfer comment: unsafe/unable for OOB attempts  Ambulation/Gait             General Gait Details: non-ambulatory at baseline   Stairs            Wheelchair Mobility    Modified Rankin (Stroke Patients Only)       Balance Overall balance assessment: Needs assistance Sitting-balance support: No upper extremity supported Sitting balance-Leahy Scale: Poor Sitting balance - Comments: very fearful of falling with increased sway in A/P plane, mod/max assist to maintain unsupported sitting balance.  Fatigues quickly (3-4  min), requesting return to supine for rest period                            Cognition Arousal/Alertness: Awake/alert Behavior During Therapy: WFL for tasks assessed/performed Overall Cognitive Status: Within Functional Limits for tasks assessed                      Exercises Other Exercises Other Exercises: Supine UE/LE therex, 1x10, act vs act assist ROM: ankle pumps, glut sets, quad sets, hip abduct/adduct; shoulder flex/ext, resisted bicep flex/ext, cross-body reaching (to promote increased indep with rolling).  Constant encouragement for completion of all therex.    General Comments        Pertinent Vitals/Pain Pain Assessment: No/denies pain    Home Living                      Prior Function            PT Goals (current goals can now be found in the care plan section) Acute Rehab PT Goals Patient Stated Goal: states she'd like to return to some mobility even if it's wheel chair related PT Goal Formulation: With patient Time For Goal Achievement: 11/17/14 Potential to Achieve Goals: Fair Progress towards PT goals: Progressing toward goals    Frequency  Min 2X/week    PT Plan Current plan remains appropriate    Co-evaluation             End of Session   Activity  Tolerance: Patient tolerated treatment well Patient left: in bed;with call bell/phone within reach;with bed alarm set     Time: 1334-1405 PT Time Calculation (min) (ACUTE ONLY): 31 min  Charges:  $Therapeutic Exercise: 8-22 mins $Therapeutic Activity: 8-22 mins                    G Codes:      Jorgeluis Gurganus H. Manson Passey, PT, DPT, NCS 11/13/2014, 2:13 PM [phone]

## 2014-11-13 NOTE — Progress Notes (Signed)
Inpatient Diabetes Program Recommendations  AACE/ADA: New Consensus Statement on Inpatient Glycemic Control (2013)  Target Ranges:  Prepandial:   less than 140 mg/dL      Peak postprandial:   less than 180 mg/dL (1-2 hours)      Critically ill patients:  140 - 180 mg/dL    Results for Eileen [name], Eileen [name] (MRN [id number]) as of 11/13/2014 09:20  Ref. Range 11/13/2014 07:58 11/13/2014 08:26  Glucose-Capillary Latest Ref Range: 65-99 mg/dL 54 (L) 74    Current DM Orders: Lantus 15 units QHS            Novolog Moderate SSI (0-15 units) TID AC/HS     MD- Note patient hypoglycemic this AM with CBG of 54 mg/dl.    Likely hypoglycemic due to the Novolog SSI she received last night at bedtime.  Patient received 2 units Novolog at bedtime last PM for CBG of 136 mg/dl.  Please change Novolog SSI to Moderate scale (0-15 units) TID AC and Change Novolog bedtime scale to the bedtime scale that doesn't start insulin coverage until patient's CBG is 201 mg/dl.  Please use Glycemic Control Order set for these orders.     Will follow Ambrose Finland RN, MSN, CDE Diabetes Coordinator Inpatient Glycemic Control Team Team Pager: [phone] (8a-5p)

## 2014-11-13 NOTE — Progress Notes (Signed)
EMS arrived. Patient transferred onto stretcher and taken by EMS. Telemetry box removed and returned to clerk.

## 2014-11-13 NOTE — Progress Notes (Signed)
Kristen with PT was notified that patient needs to be seen again today for updated notes to be sent back to facility today. PT stated she will be by sometime after 1PM today. Notified social work. Once this is complete discharge can move forward.

## 2014-11-13 NOTE — Progress Notes (Addendum)
Patient's blood sugar this morning was 54. Given juice and patient ate breakfast. CBG now 74. Patient was asymptomatic. Will continue to monitor. Notified Dr. Allena Katz patient's blood pressure is still running low. MD stated to give lasix, hold coreg.

## 2014-11-13 NOTE — Clinical Social Work Note (Addendum)
CSW notified pt, RN and facility that pt would DC today via EMS back to [facility].  CSW had notified RN that updated PT is needed to re authorize SNF placement.  CSW has also notified insurance that pt is to DC today.  Will f/u again later today for auth

## 2014-11-13 NOTE — Progress Notes (Signed)
Report called to LPN taking over for Eileen [name] at [employer], [place]. RN and CNA are going in room now to change patient and get her ready for EMS pick up. Will treat blood sugar and give patient her last meds before she leaves.

## 2014-11-14 LAB — CULTURE, RESPIRATORY W GRAM STAIN: Special Requests: NORMAL

## 2014-11-14 LAB — HEPATITIS C ANTIBODY

## 2014-11-19 ENCOUNTER — Other Ambulatory Visit
Admission: RE | Admit: 2014-11-19 | Discharge: 2014-11-19 | Disposition: A | Discharge status: Home / Self Care | Payer: PPO | Attending: Family Medicine | Admitting: Family Medicine

## 2014-11-19 DIAGNOSIS — R109 Unspecified abdominal pain: Secondary | ICD-10-CM | POA: Diagnosis not present

## 2014-11-19 DIAGNOSIS — R100 Acute abdomen: Secondary | ICD-10-CM | POA: Diagnosis present

## 2014-11-19 LAB — COMPREHENSIVE METABOLIC PANEL
ALK PHOS: 58 U/L (ref 38–126)
ALT: <5 U/L — ABNORMAL LOW (ref 14–54)
ANION GAP: 7 (ref 5–15)
AST: 10 U/L — ABNORMAL LOW (ref 15–41)
Albumin: 2.6 g/dL — ABNORMAL LOW (ref 3.5–5.0)
BUN: 13 mg/dL (ref 6–20)
CALCIUM: 7.9 mg/dL — AB (ref 8.9–10.3)
CO2: 31 mmol/L (ref 22–32)
Chloride: 96 mmol/L — ABNORMAL LOW (ref 101–111)
Creatinine, Ser: 0.67 mg/dL (ref 0.44–1.00)
GFR calc Af Amer: >60 mL/min (ref >60)
GFR calc non Af Amer: >60 mL/min (ref >60)
Glucose, Bld: 90 mg/dL (ref 65–99)
POTASSIUM: 4.3 mmol/L (ref 3.5–5.1)
SODIUM: 134 mmol/L — AB (ref 135–145)
TOTAL PROTEIN: 4.7 g/dL — AB (ref 6.5–8.1)
Total Bilirubin: 0.6 mg/dL (ref 0.3–1.2)

## 2014-11-19 LAB — URINALYSIS COMPLETE WITH MICROSCOPIC (ARMC ONLY)
BILIRUBIN URINE: NEGATIVE
Glucose, UA: NEGATIVE mg/dL
KETONES UR: NEGATIVE mg/dL
NITRITE: NEGATIVE
PH: 5 (ref 5.0–8.0)
Protein, ur: 30 mg/dL — AB
SPECIFIC GRAVITY, URINE: 1.014 (ref 1.005–1.030)

## 2014-11-19 LAB — CBC WITH DIFFERENTIAL/PLATELET
BASOS ABS: 0.1 10*3/uL (ref 0–0.1)
Eosinophils Absolute: 0.2 10*3/uL (ref 0–0.7)
Eosinophils Relative: 1 %
HEMATOCRIT: 30.9 % — AB (ref 35.0–47.0)
HEMOGLOBIN: 10.1 g/dL — AB (ref 12.0–16.0)
Lymphs Abs: 2.1 10*3/uL (ref 1.0–3.6)
MCH: 28.5 pg (ref 26.0–34.0)
MCHC: 32.6 g/dL (ref 32.0–36.0)
MCV: 87.6 fL (ref 80.0–100.0)
Monocytes Absolute: 1.2 10*3/uL — ABNORMAL HIGH (ref 0.2–0.9)
NEUTROS ABS: 7.6 10*3/uL — AB (ref 1.4–6.5)
Platelets: 52 10*3/uL — ABNORMAL LOW (ref 150–440)
RBC: 3.53 MIL/uL — ABNORMAL LOW (ref 3.80–5.20)
RDW: 15.6 % — ABNORMAL HIGH (ref 11.5–14.5)
WBC: 11.1 10*3/uL — ABNORMAL HIGH (ref 3.6–11.0)

## 2014-11-21 LAB — URINE CULTURE

## 2014-11-26 ENCOUNTER — Ambulatory Visit: Payer: PPO | Admitting: Family

## 2014-11-26 ENCOUNTER — Telehealth: Payer: Self-pay | Admitting: Family

## 2014-11-26 NOTE — Telephone Encounter (Signed)
Patient did not show for her initial appointment at the outpatient [facility] on 11/26/14. Will attempt to reschedule.

## 2015-01-12 ENCOUNTER — Emergency Department
Admission: EM | Admit: 2015-01-12 | Discharge: 2015-01-12 | Disposition: A | Discharge status: Home / Self Care | Payer: PPO | Attending: Emergency Medicine | Admitting: Emergency Medicine

## 2015-01-12 DIAGNOSIS — Z794 Long term (current) use of insulin: Secondary | ICD-10-CM | POA: Diagnosis not present

## 2015-01-12 DIAGNOSIS — K5900 Constipation, unspecified: Secondary | ICD-10-CM | POA: Diagnosis not present

## 2015-01-12 DIAGNOSIS — I10 Essential (primary) hypertension: Secondary | ICD-10-CM | POA: Diagnosis not present

## 2015-01-12 DIAGNOSIS — Z79899 Other long term (current) drug therapy: Secondary | ICD-10-CM | POA: Diagnosis not present

## 2015-01-12 DIAGNOSIS — Z792 Long term (current) use of antibiotics: Secondary | ICD-10-CM | POA: Insufficient documentation

## 2015-01-12 DIAGNOSIS — R300 Dysuria: Secondary | ICD-10-CM | POA: Diagnosis present

## 2015-01-12 DIAGNOSIS — Z880 Allergy status to penicillin: Secondary | ICD-10-CM | POA: Diagnosis not present

## 2015-01-12 DIAGNOSIS — E119 Type 2 diabetes mellitus without complications: Secondary | ICD-10-CM | POA: Insufficient documentation

## 2015-01-12 DIAGNOSIS — N390 Urinary tract infection, site not specified: Secondary | ICD-10-CM

## 2015-01-12 LAB — CBC WITH DIFFERENTIAL/PLATELET
BASOS ABS: 0.1 10*3/uL (ref 0–0.1)
Basophils Relative: 1 %
Eosinophils Absolute: 0 10*3/uL (ref 0–0.7)
Eosinophils Relative: 0 %
HEMATOCRIT: 36.4 % (ref 35.0–47.0)
Hemoglobin: 11.8 g/dL — ABNORMAL LOW (ref 12.0–16.0)
Lymphocytes Relative: 7 %
Lymphs Abs: 0.9 10*3/uL — ABNORMAL LOW (ref 1.0–3.6)
MCH: 28.7 pg (ref 26.0–34.0)
MCHC: 32.4 g/dL (ref 32.0–36.0)
MCV: 88.4 fL (ref 80.0–100.0)
Monocytes Absolute: 0.7 10*3/uL (ref 0.2–0.9)
Monocytes Relative: 5 %
NEUTROS ABS: 11.2 10*3/uL — AB (ref 1.4–6.5)
Neutrophils Relative %: 87 %
PLATELETS: 197 10*3/uL (ref 150–440)
RBC: 4.11 MIL/uL (ref 3.80–5.20)
RDW: 16.4 % — ABNORMAL HIGH (ref 11.5–14.5)
WBC: 13 10*3/uL — AB (ref 3.6–11.0)

## 2015-01-12 LAB — URINALYSIS COMPLETE WITH MICROSCOPIC (ARMC ONLY)
BILIRUBIN URINE: NEGATIVE
Glucose, UA: NEGATIVE mg/dL
Ketones, ur: NEGATIVE mg/dL
Nitrite: POSITIVE — AB
PH: 7 (ref 5.0–8.0)
Protein, ur: 100 mg/dL — AB
SPECIFIC GRAVITY, URINE: 1.009 (ref 1.005–1.030)

## 2015-01-12 LAB — BASIC METABOLIC PANEL
Anion gap: 7 (ref 5–15)
BUN: 13 mg/dL (ref 6–20)
CO2: 34 mmol/L — ABNORMAL HIGH (ref 22–32)
Calcium: 8.6 mg/dL — ABNORMAL LOW (ref 8.9–10.3)
Chloride: 96 mmol/L — ABNORMAL LOW (ref 101–111)
Creatinine, Ser: 0.77 mg/dL (ref 0.44–1.00)
GFR calc Af Amer: >60 mL/min (ref >60)
Glucose, Bld: 140 mg/dL — ABNORMAL HIGH (ref 65–99)
POTASSIUM: 3.6 mmol/L (ref 3.5–5.1)
SODIUM: 137 mmol/L (ref 135–145)

## 2015-01-12 MED ORDER — LEVOFLOXACIN IN D5W 500 MG/100ML IV SOLN
500.0000 mg | Freq: Once | INTRAVENOUS | Status: AC
  Administered 2015-01-12: 500 mg via INTRAVENOUS
  Filled 2015-01-12: qty 100

## 2015-01-12 MED ORDER — ACETAMINOPHEN 500 MG PO TABS
1000.0000 mg | ORAL_TABLET | Freq: Once | ORAL | Status: AC
  Administered 2015-01-12: 1000 mg via ORAL
  Filled 2015-01-12: qty 2

## 2015-01-12 MED ORDER — CIPROFLOXACIN HCL 500 MG PO TABS: 500.0000 mg | ORAL_TABLET | Freq: Two times a day (BID) | ORAL | Status: AC

## 2015-01-12 MED ORDER — NYSTATIN 100000 UNIT/GM EX POWD
Freq: Once | CUTANEOUS | Status: AC
  Administered 2015-01-12: 14:00:00 via TOPICAL
  Filled 2015-01-12: qty 15

## 2015-01-12 NOTE — ED Notes (Signed)
EMS transport called.

## 2015-01-12 NOTE — Discharge Instructions (Signed)
Catheter-Associated Urinary Tract Infection FAQs  What is "catheter-associated urinary tract infection"?  A urinary tract infection (also called "UTI") is an infection in the urinary system, which includes the bladder (which stores the urine) and the kidneys (which filter the blood to make urine). Germs (for example, bacteria or yeasts) do not normally live in these areas; but if germs are introduced, an infection can occur.  If you have a urinary catheter, germs can travel along the catheter and cause an infection in your bladder or your kidney; in that case it is called a catheter-associated urinary tract infection (or "CA-UTI").   What is a urinary catheter?  A urinary catheter is a thin tube placed in the bladder to drain urine. Urine drains through the tube into a bag that collects the urine. A urinary catheter may be used:  · If you are not able to urinate on your own  · To measure the amount of urine that you make, for example, during intensive care  · During and after some types of surgery  · During some tests of the kidneys and bladder  People with urinary catheters have a much higher chance of getting a urinary tract infection than people who don't have a catheter.  How do I get a catheter-associated urinary tract infection (CA-UTI)?  If germs enter the urinary tract, they may cause an infection. Many of the germs that cause a catheter-associated urinary tract infection are common germs found in your intestines that do not usually cause an infection there. Germs can enter the urinary tract when the catheter is being put in or while the catheter remains in the bladder.   What are the symptoms of a urinary tract infection?  Some of the common symptoms of a urinary tract infection are:  · Burning or pain in the lower abdomen (that is, below the stomach)  · Fever  · Bloody urine may be a sign of infection, but is also caused by other problems  · Burning during urination or an increase in the frequency of  urination after the catheter is removed.  Sometimes people with catheter-associated urinary tract infections do not have these symptoms of infection.  Can catheter-associated urinary tract infections be treated?  Yes, most catheter-associated urinary tract infections can be treated with antibiotics and removal or change of the catheter. Your doctor will determine which antibiotic is best for you.   What are some of the things that hospitals are doing to prevent catheter-associated urinary tract infections?  To prevent urinary tract infections, doctors and nurses take the following actions.   Catheter insertion  · External catheters in men (these look like condoms and are placed over the penis rather than into the penis)  · Putting a temporary catheter in to drain the urine and removing it right away. This is called intermittent urethral catheterization.  Catheter care  What can I do to help prevent catheter-associated urinary tract infections if I have a catheter?  · Always clean your hands before and after doing catheter care.  · Always keep your urine bag below the level of your bladder.  · Do not tug or pull on the tubing.  · Do not twist or kink the catheter tubing.  · Ask your healthcare provider each day if you still need the catheter.  What do I need to do when I go home from the hospital?  · If you will be going home with a catheter, your doctor or nurse should explain everything   tract infection, such as burning or pain in the lower abdomen, fever, or an increase in the frequency of urination, contact your doctor or nurse immediately.  Before you go home, make sure you know who to contact if you have questions or problems after you get home. If you have questions, please ask your doctor or nurse. Developed and co-sponsored by [employer]  Society for [employer] of [place] [phone](SHEA); Infectious Diseases Society of America (IDSA); [facility] Association; Association for Professionals in Infection Control and Epidemiology (APIC); Centers for Disease Control and Prevention (CDC); and [employer].   This information is not intended to replace advice given to you by your health care provider. Make sure you discuss any questions you have with your health care provider.   Document Released: 11/18/2011 Document Revised: 07/10/2014 Document Reviewed: 05/09/2014 Elsevier Interactive Patient Education [employer]2016 Elsevier Inc.  Please return immediately if condition worsens. Please contact her primary physician or the physician you were given for referral. If you have any specialist physicians involved in her treatment and plan please also contact them. Thank you for using [facility].

## 2015-01-12 NOTE — ED Notes (Addendum)
Pt here via ems from home, pt is bed bound and had a catheter placed a couple weeks ago, pt attempted to have a BM and strained and pt started having excruciating pain, pt was unsure where the pain was, if it was in her rectum or vagina, urine is noted to be leaking around the catheter and the catheter tubing is filled with a large amount of sediment and mucous that is grayish white in color.

## 2015-01-12 NOTE — ED Provider Notes (Signed)
Time Seen: Approximately ----------------------------------------- 10:53 AM on 01/12/2015 -----------------------------------------   I have reviewed the triage notes  Chief Complaint: Dysuria   History of Present Illness: Eileen [name] is a 69 y.o. female who presents with some history of being essentially bedbound with limited ambulation and has a Foley catheter to prevent skin breakdown, etc. She noticed intense pain after she strained to have a bowel movement primarily coming from the urethral area. The balloon apparently was moved down into the urethra when the patient strained to have a bowel movement. The balloon and Foley catheter was removed by the nursing staff with the patient now relieved of all discomfort. The catheter appears to have some chronic sediment and is somewhat foul-smelling at this point. The patient will require a repeat Foley catheter. She denies any nausea, vomiting, fever at home. She denies any chest pain or shortness of breath. She states she has no discomfort at this time.   Past Medical History  Diagnosis Date  . COPD (chronic obstructive pulmonary disease)   . Arthritis   . Hypertension   . Diabetes mellitus without complication   . CHF (congestive heart failure)   . Sleep apnea   . Depression     Patient Active Problem List   Diagnosis Date Noted  . Respiratory failure (HCC)   . Acute exacerbation of CHF (congestive heart failure) (HCC)   . Pulmonary edema   . Atrial fibrillation with rapid ventricular response (HCC)   . Acute pulmonary edema (HCC) 11/03/2014  . Leucocytosis 11/03/2014  . Elevated troponin 11/03/2014  . Paroxysmal a-fib (HCC) 11/03/2014  . OSA on CPAP 11/03/2014  . COPD (chronic obstructive pulmonary disease) (HCC) 11/03/2014  . DM (diabetes mellitus) (HCC) 11/03/2014  . HTN (hypertension) 11/03/2014  . Acute delirium 09/17/2014  . Acute on chronic respiratory failure with hypoxia (HCC) 09/14/2014  . COPD exacerbation  (HCC) 09/14/2014  . Pressure ulcer 09/14/2014    Past Surgical History  Procedure Laterality Date  . Laminectomy    . Back surgery      Past Surgical History  Procedure Laterality Date  . Laminectomy    . Back surgery      Current Outpatient Rx  Name  Route  Sig  Dispense  Refill  . acetaZOLAMIDE (DIAMOX) 250 MG tablet   Oral   Take 1 tablet (250 mg total) by mouth 2 (two) times daily.         Marland Kitchen albuterol (PROVENTIL) (2.5 MG/3ML) 0.083% nebulizer solution   Nebulization   Take 3 mLs (2.5 mg total) by nebulization 3 (three) times daily.   75 mL   12   . antiseptic oral rinse (CPC / CETYLPYRIDINIUM CHLORIDE 0.05%) 0.05 % LIQD solution   Mouth Rinse   7 mLs by Mouth Rinse route 2 times daily at 12 noon and 4 pm.   15 mL   0   . apixaban (ELIQUIS) 5 MG TABS tablet   Oral   Take 1 tablet (5 mg total) by mouth 2 (two) times daily.   60 tablet   0   . bisacodyl (DULCOLAX) 10 MG suppository   Rectal   Place 1 suppository (10 mg total) rectally daily as needed for moderate constipation.   12 suppository   0   . budesonide (PULMICORT) 0.5 MG/2ML nebulizer solution   Nebulization   Take 2 mLs (0.5 mg total) by nebulization 2 (two) times daily.   15 mL   0   . carvedilol (COREG) 6.25  MG tablet   Oral   Take 1 tablet (6.25 mg total) by mouth 2 (two) times daily with a meal.   60 tablet   0   . chlorhexidine (PERIDEX) 0.12 % solution   Mouth Rinse   15 mLs by Mouth Rinse route 2 (two) times daily.   120 mL   0   . docusate sodium (COLACE) 100 MG capsule   Oral   Take 2 capsules (200 mg total) by mouth 2 (two) times daily.   10 capsule   0   . furosemide (LASIX) 40 MG tablet   Oral   Take 1 tablet (40 mg total) by mouth 2 (two) times daily.   60 tablet   0   . guaiFENesin-codeine 100-10 MG/5ML syrup   Oral   Take 10 mLs by mouth every 6 (six) hours as needed for cough.   120 mL   0   . insulin aspart (NOVOLOG) 100 UNIT/ML injection    Subcutaneous   Inject 0-20 Units into the skin 3 (three) times daily with meals.   10 mL   11   . insulin aspart (NOVOLOG) 100 UNIT/ML injection   Subcutaneous   Inject 0-5 Units into the skin at bedtime.   10 mL   11   . insulin aspart (NOVOLOG) 100 UNIT/ML injection   Subcutaneous   Inject 0-15 Units into the skin 4 (four) times daily -  with meals and at bedtime.   10 mL   11   . insulin glargine (LANTUS) 100 UNIT/ML injection   Subcutaneous   Inject 0.15 mLs (15 Units total) into the skin at bedtime.   10 mL   11   . mupirocin ointment (BACTROBAN) 2 %   Nasal   Place into the nose 2 (two) times daily.   22 g   0   . ondansetron (ZOFRAN) 4 MG tablet   Oral   Take 1 tablet (4 mg total) by mouth every 6 (six) hours as needed for nausea.   20 tablet   0   . potassium chloride SA (K-DUR,KLOR-CON) 20 MEQ tablet   Oral   Take 1 tablet (20 mEq total) by mouth 2 (two) times daily.   60 tablet   0   . sertraline (ZOLOFT) 50 MG tablet   Oral   Take 100 mg by mouth daily.         Marland Kitchen spironolactone (ALDACTONE) 50 MG tablet   Oral   Take 1 tablet (50 mg total) by mouth daily.   30 tablet   0   . sulfamethoxazole-trimethoprim (BACTRIM,SEPTRA) 200-40 MG/5ML suspension   Oral   Take 20 mLs by mouth 2 (two) times daily.   100 mL   0   . theophylline (THEODUR) 200 MG 12 hr tablet   Oral   Take 1 tablet (200 mg total) by mouth every 12 (twelve) hours.         Marland Kitchen tiotropium (SPIRIVA) 18 MCG inhalation capsule   Inhalation   Place 1 capsule (18 mcg total) into inhaler and inhale daily.   30 capsule   12   . traZODone (DESYREL) 100 MG tablet   Oral   Take 100 mg by mouth at bedtime.         Marland Kitchen trimethoprim-polymyxin b (POLYTRIM) ophthalmic solution   Left Eye   Place 2 drops into the left eye every 4 (four) hours.   10 mL   0     Allergies:  Other; Penicillins; Betadine; and Darvon  Family History: Family History  Problem Relation Age of Onset  .  CAD Mother   . Diabetes type II Mother   . Hypertension Father   . CAD Father     Social History: Social History  Substance Use Topics  . Smoking status: Never Smoker   . Smokeless tobacco: Never Used  . Alcohol Use: No     Review of Systems:   10 point review of systems was performed and was otherwise negative:  Constitutional: No fever Eyes: No visual disturbances ENT: No sore throat, ear pain Cardiac: No chest pain Respiratory: No shortness of breath, wheezing, or stridor Abdomen: No abdominal pain, no vomiting, No diarrhea. Patient describes chronic constipation Endocrine: No weight loss, No night sweats Extremities: No peripheral edema, cyanosis Skin: No rashes, easy bruising Neurologic: No focal weakness, trouble with speech or swollowing Urologic: No dysuria, Hematuria, or urinary frequency   Physical Exam:  ED Triage Vitals  Enc Vitals Group     BP 01/12/15 1020 123/77 mmHg     Pulse Rate 01/12/15 1020 70     Resp 01/12/15 1020 20     Temp 01/12/15 1020 98 F (36.7 C)     Temp Source 01/12/15 1020 Oral     SpO2 01/12/15 1020 96 %     Weight 01/12/15 1020 345 lb 0.3 oz (156.5 kg)     Height 01/12/15 1020  (1.499 m)     Head Cir --      Peak Flow --      Pain Score 01/12/15 1022 10     Pain Loc --      Pain Edu? --      Excl. in GC? --     General: Awake , Alert , and Oriented times 3; GCS 15 Head: Normal cephalic , atraumatic Eyes: Pupils equal , round, reactive to light Nose/Throat: No nasal drainage, patent upper airway without erythema or exudate.  Neck: Supple, Full range of motion, No anterior adenopathy or palpable thyroid masses Lungs: Clear to ascultation without wheezes , rhonchi, or rales Heart: Regular rate, regular rhythm without murmurs , gallops , or rubs Abdomen: Morbidly obese, nontender without rebound, guarding , or rigidity; bowel sounds positive and symmetric in all 4 quadrants. No organomegaly .        Extremities: 2 plus  symmetric pulses. No edema, clubbing or cyanosis Neurologic: Nonambulatory Motor symmetric without deficits, sensory intact Skin: warm, dry, no rashes   Labs:   All laboratory work was reviewed including any pertinent negatives or positives listed below:  Labs Reviewed  URINE CULTURE  BASIC METABOLIC PANEL  CBC WITH DIFFERENTIAL/PLATELET  URINALYSIS COMPLETEWITH MICROSCOPIC ([facility] ONLY)   review laboratory work shows findings indicative of urinary tract infection after placement of the fresh catheter.    ED course Patient's otherwise hemodynamically stable and has history of hypertension and hasn't had her medication today. Patient be transported by EMS by BLS unit home since she is not ambulatory. The patient was started on IV antibiotics here him be discharged on ciprofloxacin. She's been advised drink plenty of fluids and return here if she develops a fever, increased pain, persistent vomiting or any other new concerns.     Assessment:  Acute urinary tract infection Chronic indwelling catheter      Plan: Outpatient management Patient was advised to return immediately if condition worsens. Patient was advised to follow up with her primary care physician or other specialized physicians involved  and in their current assessment. Jennye Moccasin*            Adel Burch S Lajada Janes, MD 01/12/15 [phone]

## 2015-01-12 NOTE — ED Notes (Signed)
Catheter removed without difficulty, intact, pt states that her pain has now been relieved, dr Huel Cotequigley to bedside

## 2015-01-15 LAB — URINE CULTURE: Culture: >=100000

## 2015-01-16 NOTE — Progress Notes (Signed)
ED Culture Report:  Ms. Eileen [name] presented to the ED on 11/5 complaining of urinary symptoms. She had a urine culture taken and was discharged with a course of Ciprofloxacin due to her documented penicillin allergy (reaction of nausea/vomiting).  Ms. Eileen [name] urine culture grew out proteus and strep aureus that were cipro resistant. Dr. Phineas SemenGraydon Goodman approved a prescription for keflex 500mg  BID x7 days. This script was called into [facility] for delivery to the patient.  Roque CashAllison Shaheer Bonfield, PharmD 01/16/2015

## 2015-01-29 ENCOUNTER — Other Ambulatory Visit: Payer: Self-pay | Admitting: *Deleted

## 2015-01-29 NOTE — Patient Outreach (Signed)
[facility] Care Management  01/29/2015  Eileen MorrowShirley A [name] December 20, 1945 [id number]   RN Health Coach attempted outreach call to Tier 3  patient to discuss services of [facility]. Servoces are declined due to patient is being serviced by Hospice. Eileen MaidensFrances Lavoris [name] [facility] Care Management [phone]

## 2015-07-22 DIAGNOSIS — J449 Chronic obstructive pulmonary disease, unspecified: Secondary | ICD-10-CM | POA: Diagnosis not present

## 2015-07-30 DIAGNOSIS — E119 Type 2 diabetes mellitus without complications: Secondary | ICD-10-CM | POA: Diagnosis not present

## 2015-07-30 DIAGNOSIS — S91202D Unspecified open wound of left great toe with damage to nail, subsequent encounter: Secondary | ICD-10-CM | POA: Diagnosis not present

## 2015-07-30 DIAGNOSIS — Z794 Long term (current) use of insulin: Secondary | ICD-10-CM | POA: Diagnosis not present

## 2015-07-30 DIAGNOSIS — I509 Heart failure, unspecified: Secondary | ICD-10-CM | POA: Diagnosis not present

## 2015-07-30 DIAGNOSIS — M1991 Primary osteoarthritis, unspecified site: Secondary | ICD-10-CM | POA: Diagnosis not present

## 2015-07-30 DIAGNOSIS — F419 Anxiety disorder, unspecified: Secondary | ICD-10-CM | POA: Diagnosis not present

## 2015-07-30 DIAGNOSIS — Z6841 Body Mass Index (BMI) 40.0 and over, adult: Secondary | ICD-10-CM | POA: Diagnosis not present

## 2015-07-30 DIAGNOSIS — I110 Hypertensive heart disease with heart failure: Secondary | ICD-10-CM | POA: Diagnosis not present

## 2015-07-30 DIAGNOSIS — Z9981 Dependence on supplemental oxygen: Secondary | ICD-10-CM | POA: Diagnosis not present

## 2015-07-30 DIAGNOSIS — Z4800 Encounter for change or removal of nonsurgical wound dressing: Secondary | ICD-10-CM | POA: Diagnosis not present

## 2015-07-30 DIAGNOSIS — F329 Major depressive disorder, single episode, unspecified: Secondary | ICD-10-CM | POA: Diagnosis not present

## 2015-07-30 DIAGNOSIS — G3184 Mild cognitive impairment, so stated: Secondary | ICD-10-CM | POA: Diagnosis not present

## 2015-07-30 DIAGNOSIS — G4700 Insomnia, unspecified: Secondary | ICD-10-CM | POA: Diagnosis not present

## 2015-07-30 DIAGNOSIS — J449 Chronic obstructive pulmonary disease, unspecified: Secondary | ICD-10-CM | POA: Diagnosis not present

## 2015-07-30 DIAGNOSIS — Z7401 Bed confinement status: Secondary | ICD-10-CM | POA: Diagnosis not present

## 2015-08-02 DIAGNOSIS — S91202D Unspecified open wound of left great toe with damage to nail, subsequent encounter: Secondary | ICD-10-CM | POA: Diagnosis not present

## 2015-08-02 DIAGNOSIS — G4700 Insomnia, unspecified: Secondary | ICD-10-CM | POA: Diagnosis not present

## 2015-08-02 DIAGNOSIS — Z4800 Encounter for change or removal of nonsurgical wound dressing: Secondary | ICD-10-CM | POA: Diagnosis not present

## 2015-08-02 DIAGNOSIS — Z6841 Body Mass Index (BMI) 40.0 and over, adult: Secondary | ICD-10-CM | POA: Diagnosis not present

## 2015-08-02 DIAGNOSIS — M1991 Primary osteoarthritis, unspecified site: Secondary | ICD-10-CM | POA: Diagnosis not present

## 2015-08-02 DIAGNOSIS — J449 Chronic obstructive pulmonary disease, unspecified: Secondary | ICD-10-CM | POA: Diagnosis not present

## 2015-08-02 DIAGNOSIS — I110 Hypertensive heart disease with heart failure: Secondary | ICD-10-CM | POA: Diagnosis not present

## 2015-08-02 DIAGNOSIS — Z7401 Bed confinement status: Secondary | ICD-10-CM | POA: Diagnosis not present

## 2015-08-02 DIAGNOSIS — I509 Heart failure, unspecified: Secondary | ICD-10-CM | POA: Diagnosis not present

## 2015-08-02 DIAGNOSIS — Z794 Long term (current) use of insulin: Secondary | ICD-10-CM | POA: Diagnosis not present

## 2015-08-02 DIAGNOSIS — Z9981 Dependence on supplemental oxygen: Secondary | ICD-10-CM | POA: Diagnosis not present

## 2015-08-02 DIAGNOSIS — F329 Major depressive disorder, single episode, unspecified: Secondary | ICD-10-CM | POA: Diagnosis not present

## 2015-08-02 DIAGNOSIS — G3184 Mild cognitive impairment, so stated: Secondary | ICD-10-CM | POA: Diagnosis not present

## 2015-08-02 DIAGNOSIS — F419 Anxiety disorder, unspecified: Secondary | ICD-10-CM | POA: Diagnosis not present

## 2015-08-02 DIAGNOSIS — E119 Type 2 diabetes mellitus without complications: Secondary | ICD-10-CM | POA: Diagnosis not present

## 2015-08-06 DIAGNOSIS — S91202D Unspecified open wound of left great toe with damage to nail, subsequent encounter: Secondary | ICD-10-CM | POA: Diagnosis not present

## 2015-08-06 DIAGNOSIS — I110 Hypertensive heart disease with heart failure: Secondary | ICD-10-CM | POA: Diagnosis not present

## 2015-08-06 DIAGNOSIS — G4700 Insomnia, unspecified: Secondary | ICD-10-CM | POA: Diagnosis not present

## 2015-08-06 DIAGNOSIS — E119 Type 2 diabetes mellitus without complications: Secondary | ICD-10-CM | POA: Diagnosis not present

## 2015-08-06 DIAGNOSIS — Z6841 Body Mass Index (BMI) 40.0 and over, adult: Secondary | ICD-10-CM | POA: Diagnosis not present

## 2015-08-06 DIAGNOSIS — I509 Heart failure, unspecified: Secondary | ICD-10-CM | POA: Diagnosis not present

## 2015-08-06 DIAGNOSIS — Z4800 Encounter for change or removal of nonsurgical wound dressing: Secondary | ICD-10-CM | POA: Diagnosis not present

## 2015-08-06 DIAGNOSIS — M1991 Primary osteoarthritis, unspecified site: Secondary | ICD-10-CM | POA: Diagnosis not present

## 2015-08-06 DIAGNOSIS — F329 Major depressive disorder, single episode, unspecified: Secondary | ICD-10-CM | POA: Diagnosis not present

## 2015-08-06 DIAGNOSIS — J449 Chronic obstructive pulmonary disease, unspecified: Secondary | ICD-10-CM | POA: Diagnosis not present

## 2015-08-06 DIAGNOSIS — F419 Anxiety disorder, unspecified: Secondary | ICD-10-CM | POA: Diagnosis not present

## 2015-08-06 DIAGNOSIS — G3184 Mild cognitive impairment, so stated: Secondary | ICD-10-CM | POA: Diagnosis not present

## 2015-08-07 DIAGNOSIS — Z6841 Body Mass Index (BMI) 40.0 and over, adult: Secondary | ICD-10-CM | POA: Diagnosis not present

## 2015-08-07 DIAGNOSIS — G3184 Mild cognitive impairment, so stated: Secondary | ICD-10-CM | POA: Diagnosis not present

## 2015-08-07 DIAGNOSIS — S91202D Unspecified open wound of left great toe with damage to nail, subsequent encounter: Secondary | ICD-10-CM | POA: Diagnosis not present

## 2015-08-07 DIAGNOSIS — F329 Major depressive disorder, single episode, unspecified: Secondary | ICD-10-CM | POA: Diagnosis not present

## 2015-08-07 DIAGNOSIS — G4700 Insomnia, unspecified: Secondary | ICD-10-CM | POA: Diagnosis not present

## 2015-08-07 DIAGNOSIS — I110 Hypertensive heart disease with heart failure: Secondary | ICD-10-CM | POA: Diagnosis not present

## 2015-08-07 DIAGNOSIS — Z4800 Encounter for change or removal of nonsurgical wound dressing: Secondary | ICD-10-CM | POA: Diagnosis not present

## 2015-08-07 DIAGNOSIS — J449 Chronic obstructive pulmonary disease, unspecified: Secondary | ICD-10-CM | POA: Diagnosis not present

## 2015-08-07 DIAGNOSIS — E119 Type 2 diabetes mellitus without complications: Secondary | ICD-10-CM | POA: Diagnosis not present

## 2015-08-07 DIAGNOSIS — I509 Heart failure, unspecified: Secondary | ICD-10-CM | POA: Diagnosis not present

## 2015-08-07 DIAGNOSIS — F419 Anxiety disorder, unspecified: Secondary | ICD-10-CM | POA: Diagnosis not present

## 2015-08-07 DIAGNOSIS — M1991 Primary osteoarthritis, unspecified site: Secondary | ICD-10-CM | POA: Diagnosis not present

## 2015-08-08 DIAGNOSIS — I10 Essential (primary) hypertension: Secondary | ICD-10-CM | POA: Diagnosis not present

## 2015-08-08 DIAGNOSIS — Z79899 Other long term (current) drug therapy: Secondary | ICD-10-CM | POA: Diagnosis not present

## 2015-08-08 DIAGNOSIS — E119 Type 2 diabetes mellitus without complications: Secondary | ICD-10-CM | POA: Diagnosis not present

## 2015-08-13 DIAGNOSIS — F419 Anxiety disorder, unspecified: Secondary | ICD-10-CM | POA: Diagnosis not present

## 2015-08-13 DIAGNOSIS — I110 Hypertensive heart disease with heart failure: Secondary | ICD-10-CM | POA: Diagnosis not present

## 2015-08-13 DIAGNOSIS — M1991 Primary osteoarthritis, unspecified site: Secondary | ICD-10-CM | POA: Diagnosis not present

## 2015-08-13 DIAGNOSIS — F329 Major depressive disorder, single episode, unspecified: Secondary | ICD-10-CM | POA: Diagnosis not present

## 2015-08-13 DIAGNOSIS — E119 Type 2 diabetes mellitus without complications: Secondary | ICD-10-CM | POA: Diagnosis not present

## 2015-08-13 DIAGNOSIS — J449 Chronic obstructive pulmonary disease, unspecified: Secondary | ICD-10-CM | POA: Diagnosis not present

## 2015-08-13 DIAGNOSIS — G4700 Insomnia, unspecified: Secondary | ICD-10-CM | POA: Diagnosis not present

## 2015-08-13 DIAGNOSIS — Z6841 Body Mass Index (BMI) 40.0 and over, adult: Secondary | ICD-10-CM | POA: Diagnosis not present

## 2015-08-13 DIAGNOSIS — G3184 Mild cognitive impairment, so stated: Secondary | ICD-10-CM | POA: Diagnosis not present

## 2015-08-13 DIAGNOSIS — I509 Heart failure, unspecified: Secondary | ICD-10-CM | POA: Diagnosis not present

## 2015-08-13 DIAGNOSIS — S91202D Unspecified open wound of left great toe with damage to nail, subsequent encounter: Secondary | ICD-10-CM | POA: Diagnosis not present

## 2015-08-13 DIAGNOSIS — Z4800 Encounter for change or removal of nonsurgical wound dressing: Secondary | ICD-10-CM | POA: Diagnosis not present

## 2015-08-19 DIAGNOSIS — S91202D Unspecified open wound of left great toe with damage to nail, subsequent encounter: Secondary | ICD-10-CM | POA: Diagnosis not present

## 2015-08-19 DIAGNOSIS — G4700 Insomnia, unspecified: Secondary | ICD-10-CM | POA: Diagnosis not present

## 2015-08-19 DIAGNOSIS — I509 Heart failure, unspecified: Secondary | ICD-10-CM | POA: Diagnosis not present

## 2015-08-19 DIAGNOSIS — F419 Anxiety disorder, unspecified: Secondary | ICD-10-CM | POA: Diagnosis not present

## 2015-08-19 DIAGNOSIS — J449 Chronic obstructive pulmonary disease, unspecified: Secondary | ICD-10-CM | POA: Diagnosis not present

## 2015-08-19 DIAGNOSIS — F329 Major depressive disorder, single episode, unspecified: Secondary | ICD-10-CM | POA: Diagnosis not present

## 2015-08-19 DIAGNOSIS — M1991 Primary osteoarthritis, unspecified site: Secondary | ICD-10-CM | POA: Diagnosis not present

## 2015-08-19 DIAGNOSIS — Z6841 Body Mass Index (BMI) 40.0 and over, adult: Secondary | ICD-10-CM | POA: Diagnosis not present

## 2015-08-19 DIAGNOSIS — E119 Type 2 diabetes mellitus without complications: Secondary | ICD-10-CM | POA: Diagnosis not present

## 2015-08-19 DIAGNOSIS — G3184 Mild cognitive impairment, so stated: Secondary | ICD-10-CM | POA: Diagnosis not present

## 2015-08-19 DIAGNOSIS — Z4800 Encounter for change or removal of nonsurgical wound dressing: Secondary | ICD-10-CM | POA: Diagnosis not present

## 2015-08-19 DIAGNOSIS — I110 Hypertensive heart disease with heart failure: Secondary | ICD-10-CM | POA: Diagnosis not present

## 2015-08-22 DIAGNOSIS — S91202D Unspecified open wound of left great toe with damage to nail, subsequent encounter: Secondary | ICD-10-CM | POA: Diagnosis not present

## 2015-08-22 DIAGNOSIS — G3184 Mild cognitive impairment, so stated: Secondary | ICD-10-CM | POA: Diagnosis not present

## 2015-08-22 DIAGNOSIS — Z6841 Body Mass Index (BMI) 40.0 and over, adult: Secondary | ICD-10-CM | POA: Diagnosis not present

## 2015-08-22 DIAGNOSIS — M1991 Primary osteoarthritis, unspecified site: Secondary | ICD-10-CM | POA: Diagnosis not present

## 2015-08-22 DIAGNOSIS — Z4800 Encounter for change or removal of nonsurgical wound dressing: Secondary | ICD-10-CM | POA: Diagnosis not present

## 2015-08-22 DIAGNOSIS — F419 Anxiety disorder, unspecified: Secondary | ICD-10-CM | POA: Diagnosis not present

## 2015-08-22 DIAGNOSIS — G4700 Insomnia, unspecified: Secondary | ICD-10-CM | POA: Diagnosis not present

## 2015-08-22 DIAGNOSIS — F329 Major depressive disorder, single episode, unspecified: Secondary | ICD-10-CM | POA: Diagnosis not present

## 2015-08-22 DIAGNOSIS — E119 Type 2 diabetes mellitus without complications: Secondary | ICD-10-CM | POA: Diagnosis not present

## 2015-08-22 DIAGNOSIS — J449 Chronic obstructive pulmonary disease, unspecified: Secondary | ICD-10-CM | POA: Diagnosis not present

## 2015-08-22 DIAGNOSIS — I110 Hypertensive heart disease with heart failure: Secondary | ICD-10-CM | POA: Diagnosis not present

## 2015-08-22 DIAGNOSIS — I509 Heart failure, unspecified: Secondary | ICD-10-CM | POA: Diagnosis not present

## 2015-08-26 ENCOUNTER — Encounter: Payer: Self-pay | Admitting: *Deleted

## 2015-08-26 ENCOUNTER — Emergency Department
Admission: EM | Admit: 2015-08-26 | Discharge: 2015-08-29 | Disposition: A | Discharge status: Home / Self Care | Payer: PPO | Attending: Emergency Medicine | Admitting: Emergency Medicine

## 2015-08-26 ENCOUNTER — Emergency Department: Payer: PPO

## 2015-08-26 DIAGNOSIS — K5909 Other constipation: Secondary | ICD-10-CM | POA: Diagnosis not present

## 2015-08-26 DIAGNOSIS — F329 Major depressive disorder, single episode, unspecified: Secondary | ICD-10-CM | POA: Insufficient documentation

## 2015-08-26 DIAGNOSIS — I4891 Unspecified atrial fibrillation: Secondary | ICD-10-CM | POA: Insufficient documentation

## 2015-08-26 DIAGNOSIS — I110 Hypertensive heart disease with heart failure: Secondary | ICD-10-CM | POA: Insufficient documentation

## 2015-08-26 DIAGNOSIS — I509 Heart failure, unspecified: Secondary | ICD-10-CM | POA: Insufficient documentation

## 2015-08-26 DIAGNOSIS — E119 Type 2 diabetes mellitus without complications: Secondary | ICD-10-CM | POA: Insufficient documentation

## 2015-08-26 DIAGNOSIS — M1990 Unspecified osteoarthritis, unspecified site: Secondary | ICD-10-CM | POA: Insufficient documentation

## 2015-08-26 DIAGNOSIS — R1084 Generalized abdominal pain: Secondary | ICD-10-CM | POA: Insufficient documentation

## 2015-08-26 DIAGNOSIS — Z79899 Other long term (current) drug therapy: Secondary | ICD-10-CM | POA: Diagnosis not present

## 2015-08-26 DIAGNOSIS — J441 Chronic obstructive pulmonary disease with (acute) exacerbation: Secondary | ICD-10-CM | POA: Diagnosis not present

## 2015-08-26 DIAGNOSIS — K5900 Constipation, unspecified: Secondary | ICD-10-CM | POA: Diagnosis not present

## 2015-08-26 DIAGNOSIS — Z794 Long term (current) use of insulin: Secondary | ICD-10-CM | POA: Diagnosis not present

## 2015-08-26 LAB — URINALYSIS COMPLETE WITH MICROSCOPIC (ARMC ONLY)
Bilirubin Urine: NEGATIVE
Glucose, UA: NEGATIVE mg/dL
HGB URINE DIPSTICK: NEGATIVE
KETONES UR: NEGATIVE mg/dL
LEUKOCYTES UA: NEGATIVE
Nitrite: NEGATIVE
PH: 6 (ref 5.0–8.0)
PROTEIN: NEGATIVE mg/dL
Specific Gravity, Urine: 1.056 — ABNORMAL HIGH (ref 1.005–1.030)

## 2015-08-26 LAB — COMPREHENSIVE METABOLIC PANEL
ALT: 9 U/L — ABNORMAL LOW (ref 14–54)
AST: 17 U/L (ref 15–41)
Albumin: 3 g/dL — ABNORMAL LOW (ref 3.5–5.0)
Alkaline Phosphatase: 87 U/L (ref 38–126)
Anion gap: 5 (ref 5–15)
BUN: 16 mg/dL (ref 6–20)
CHLORIDE: 101 mmol/L (ref 101–111)
CO2: 35 mmol/L — ABNORMAL HIGH (ref 22–32)
Calcium: 8.5 mg/dL — ABNORMAL LOW (ref 8.9–10.3)
Creatinine, Ser: 0.73 mg/dL (ref 0.44–1.00)
Glucose, Bld: 103 mg/dL — ABNORMAL HIGH (ref 65–99)
POTASSIUM: 4.5 mmol/L (ref 3.5–5.1)
Sodium: 141 mmol/L (ref 135–145)
Total Bilirubin: 0.3 mg/dL (ref 0.3–1.2)
Total Protein: 6.4 g/dL — ABNORMAL LOW (ref 6.5–8.1)

## 2015-08-26 LAB — CBC WITH DIFFERENTIAL/PLATELET
BASOS ABS: 0 10*3/uL (ref 0–0.1)
Basophils Relative: 1 %
Eosinophils Absolute: 0.1 10*3/uL (ref 0–0.7)
Eosinophils Relative: 2 %
HEMATOCRIT: 36.5 % (ref 35.0–47.0)
Hemoglobin: 11.7 g/dL — ABNORMAL LOW (ref 12.0–16.0)
LYMPHS PCT: 19 %
Lymphs Abs: 1.3 10*3/uL (ref 1.0–3.6)
MCH: 28.1 pg (ref 26.0–34.0)
MCHC: 32.1 g/dL (ref 32.0–36.0)
MCV: 87.5 fL (ref 80.0–100.0)
Monocytes Absolute: 0.5 10*3/uL (ref 0.2–0.9)
Monocytes Relative: 8 %
NEUTROS ABS: 4.7 10*3/uL (ref 1.4–6.5)
NEUTROS PCT: 70 %
PLATELETS: 128 10*3/uL — AB (ref 150–440)
RBC: 4.17 MIL/uL (ref 3.80–5.20)
RDW: 16 % — ABNORMAL HIGH (ref 11.5–14.5)
WBC: 6.7 10*3/uL (ref 3.6–11.0)

## 2015-08-26 LAB — LIPASE, BLOOD: LIPASE: 15 U/L (ref 11–51)

## 2015-08-26 LAB — GLUCOSE, CAPILLARY: Glucose-Capillary: 120 mg/dL — ABNORMAL HIGH (ref 65–99)

## 2015-08-26 MED ORDER — CHLORHEXIDINE GLUCONATE 0.12 % MT SOLN
15.0000 mL | Freq: Two times a day (BID) | OROMUCOSAL | Status: DC
  Administered 2015-08-27 – 2015-08-28 (×4): 15 mL via OROMUCOSAL
  Filled 2015-08-26 (×5): qty 15

## 2015-08-26 MED ORDER — TRAZODONE HCL 100 MG PO TABS
100.0000 mg | ORAL_TABLET | Freq: Every day | ORAL | Status: DC
  Administered 2015-08-27 – 2015-08-28 (×3): 100 mg via ORAL
  Filled 2015-08-26 (×4): qty 1

## 2015-08-26 MED ORDER — TIOTROPIUM BROMIDE MONOHYDRATE 18 MCG IN CAPS
18.0000 ug | ORAL_CAPSULE | Freq: Every day | RESPIRATORY_TRACT | Status: DC
  Administered 2015-08-26 – 2015-08-29 (×4): 18 ug via RESPIRATORY_TRACT
  Filled 2015-08-26 (×2): qty 5

## 2015-08-26 MED ORDER — INSULIN ASPART 100 UNIT/ML ~~LOC~~ SOLN: 0.0000 [IU] | Freq: Three times a day (TID) | SUBCUTANEOUS | Status: DC

## 2015-08-26 MED ORDER — INSULIN ASPART 100 UNIT/ML ~~LOC~~ SOLN
0.0000 [IU] | Freq: Three times a day (TID) | SUBCUTANEOUS | Status: DC
Start: 2015-08-27 — End: 2015-08-27

## 2015-08-26 MED ORDER — THEOPHYLLINE ER 200 MG PO TB12
200.0000 mg | ORAL_TABLET | Freq: Two times a day (BID) | ORAL | Status: DC
  Administered 2015-08-26: 200 mg via ORAL
  Filled 2015-08-26 (×4): qty 1

## 2015-08-26 MED ORDER — DOCUSATE SODIUM 100 MG PO CAPS
200.0000 mg | ORAL_CAPSULE | Freq: Two times a day (BID) | ORAL | Status: DC
  Administered 2015-08-26 – 2015-08-29 (×6): 200 mg via ORAL
  Filled 2015-08-26 (×6): qty 2

## 2015-08-26 MED ORDER — IOPAMIDOL (ISOVUE-300) INJECTION 61%
125.0000 mL | Freq: Once | INTRAVENOUS | Status: AC | PRN
  Administered 2015-08-26: 125 mL via INTRAVENOUS

## 2015-08-26 MED ORDER — BUDESONIDE 0.5 MG/2ML IN SUSP
0.5000 mg | Freq: Two times a day (BID) | RESPIRATORY_TRACT | Status: DC
  Administered 2015-08-26 – 2015-08-29 (×6): 0.5 mg via RESPIRATORY_TRACT
  Filled 2015-08-26 (×10): qty 2

## 2015-08-26 MED ORDER — POTASSIUM CHLORIDE CRYS ER 20 MEQ PO TBCR
20.0000 meq | EXTENDED_RELEASE_TABLET | Freq: Two times a day (BID) | ORAL | Status: DC
  Administered 2015-08-26 – 2015-08-29 (×6): 20 meq via ORAL
  Filled 2015-08-26 (×6): qty 1

## 2015-08-26 MED ORDER — ACETAZOLAMIDE 250 MG PO TABS
250.0000 mg | ORAL_TABLET | Freq: Two times a day (BID) | ORAL | Status: DC
  Administered 2015-08-27 – 2015-08-29 (×5): 250 mg via ORAL
  Filled 2015-08-26 (×7): qty 1

## 2015-08-26 MED ORDER — APIXABAN 5 MG PO TABS
5.0000 mg | ORAL_TABLET | Freq: Two times a day (BID) | ORAL | Status: DC
  Administered 2015-08-26 – 2015-08-29 (×6): 5 mg via ORAL
  Filled 2015-08-26 (×10): qty 1

## 2015-08-26 MED ORDER — CETYLPYRIDINIUM CHLORIDE 0.05 % MT LIQD: 7.0000 mL | Freq: Two times a day (BID) | OROMUCOSAL | Status: DC

## 2015-08-26 MED ORDER — SERTRALINE HCL 100 MG PO TABS
100.0000 mg | ORAL_TABLET | Freq: Every day | ORAL | Status: DC
  Administered 2015-08-26 – 2015-08-29 (×4): 100 mg via ORAL
  Filled 2015-08-26 (×5): qty 1

## 2015-08-26 MED ORDER — ALBUTEROL SULFATE (2.5 MG/3ML) 0.083% IN NEBU
2.5000 mg | INHALATION_SOLUTION | Freq: Three times a day (TID) | RESPIRATORY_TRACT | Status: DC | PRN
  Filled 2015-08-26: qty 3

## 2015-08-26 MED ORDER — CARVEDILOL 6.25 MG PO TABS
6.2500 mg | ORAL_TABLET | Freq: Two times a day (BID) | ORAL | Status: DC
  Administered 2015-08-26 – 2015-08-28 (×5): 6.25 mg via ORAL
  Filled 2015-08-26 (×6): qty 1

## 2015-08-26 MED ORDER — SPIRONOLACTONE 25 MG PO TABS
50.0000 mg | ORAL_TABLET | Freq: Every day | ORAL | Status: DC
  Administered 2015-08-26 – 2015-08-29 (×4): 50 mg via ORAL
  Filled 2015-08-26 (×4): qty 2

## 2015-08-26 MED ORDER — FUROSEMIDE 40 MG PO TABS
40.0000 mg | ORAL_TABLET | Freq: Two times a day (BID) | ORAL | Status: DC
  Administered 2015-08-26 – 2015-08-29 (×6): 40 mg via ORAL
  Filled 2015-08-26 (×7): qty 1

## 2015-08-26 MED ORDER — DIATRIZOATE MEGLUMINE & SODIUM 66-10 % PO SOLN
15.0000 mL | Freq: Once | ORAL | Status: AC
  Administered 2015-08-26: 15 mL via ORAL

## 2015-08-26 MED ORDER — MUPIROCIN 2 % EX OINT
TOPICAL_OINTMENT | Freq: Two times a day (BID) | CUTANEOUS | Status: DC
  Administered 2015-08-27 – 2015-08-28 (×2): via NASAL
  Filled 2015-08-26 (×3): qty 22

## 2015-08-26 MED ORDER — INSULIN ASPART 100 UNIT/ML ~~LOC~~ SOLN: 0.0000 [IU] | Freq: Every day | SUBCUTANEOUS | Status: DC

## 2015-08-26 MED ORDER — GUAIFENESIN-CODEINE 100-10 MG/5ML PO SOLN: 10.0000 mL | Freq: Four times a day (QID) | ORAL | Status: DC | PRN

## 2015-08-26 MED ORDER — INSULIN GLARGINE 100 UNIT/ML ~~LOC~~ SOLN: 15.0000 [IU] | Freq: Every day | SUBCUTANEOUS | Status: DC

## 2015-08-26 NOTE — ED Notes (Signed)
Patient saturated with urine on arrival, bathed, max assist turn in bed.

## 2015-08-26 NOTE — Care Management (Signed)
Contacted by Dr York CeriseForbach.  States that there is no medical necessity for observation or inpatient admission.  Discussed that CSW will resume attempts to place patient in the morning.

## 2015-08-26 NOTE — ED Notes (Signed)
Pt given sandwich tray and ice water.  No other requests at this time.

## 2015-08-26 NOTE — Clinical Social Work Note (Signed)
CSW consulted for pt for "placement. bedbound, lives at home, husband caretaker now incapacitated by recent stroke." CSW spoke with Max Fickleina Reese with [facility] Adult Protective Services [phone]), regarding pt. Pt has an open Adult Protective Services case and a Care Plan. Pt's primary caregiver, her husband, has had a stroke and [facility]. Pt does have a son, however he is unable to care for Pt has not taken her medication. Per Inetta Fermoina, pt has home health services and they have working on placement. Pt need LTC at the SNF level, and DSS has been working with [facility]. CSW spoke with [facility] and they are able to make a bed offer, however there are limitations to the bed offer (pt owes a balance). Pt does not have Medicaid. CSW spoke with Clinical Social Work Barrister's clerkDepartment Assistance Director, a LOG will not be approved. Per EDP, pt is not getting admitted, due to lack of medical necessity. CSW left a voicemail and updated Max Fickleina Reese. CSW will also complete an FL2 in anticipation for SNF placement. CSW will continue to follow.   Dede QuerySarah Delaney Perona, MSW, LCSW  Clinical Social Worker  [phone]

## 2015-08-26 NOTE — ED Provider Notes (Signed)
[facility] Emergency Department Provider Note  ____________________________________________  Time seen: 1:50 PM  I have reviewed the triage vital signs and the nursing notes.   HISTORY  Chief Complaint Constipation    HPI Eileen [name] is a 70 y.o. female who complains of generalized abdominal pain and constipation for the past 7 days. Reports her last bowel movement was 7 or 8 days ago. She has nausea but no vomiting. She's been eating normally. Pain is colicky, moderate intensity, nonradiating, cramping and aching.  I was informed by the case manager that the Department of social services is involved in this patient because she is bedbound and normally lived at home with her husband who is her caretaker. However, he has suffered a severe stroke and is not expected to recover to independent status and therefore this bedbound patient currently has no one to assist her with her ADLs.     Past Medical History  Diagnosis Date  . COPD (chronic obstructive pulmonary disease) (HCC)   . Arthritis   . Hypertension   . Diabetes mellitus without complication (HCC)   . CHF (congestive heart failure) (HCC)   . Sleep apnea   . Depression      Patient Active Problem List   Diagnosis Date Noted  . Respiratory failure (HCC)   . Acute exacerbation of CHF (congestive heart failure) (HCC)   . Pulmonary edema   . Atrial fibrillation with rapid ventricular response (HCC)   . Acute pulmonary edema (HCC) 11/03/2014  . Leucocytosis 11/03/2014  . Elevated troponin 11/03/2014  . Paroxysmal a-fib (HCC) 11/03/2014  . OSA on CPAP 11/03/2014  . COPD (chronic obstructive pulmonary disease) (HCC) 11/03/2014  . DM (diabetes mellitus) (HCC) 11/03/2014  . HTN (hypertension) 11/03/2014  . Acute delirium 09/17/2014  . Acute on chronic respiratory failure with hypoxia (HCC) 09/14/2014  . COPD exacerbation (HCC) 09/14/2014  . Pressure ulcer 09/14/2014     Past Surgical  History  Procedure Laterality Date  . Laminectomy    . Back surgery    Cholecystectomy   Current Outpatient Rx  Name  Route  Sig  Dispense  Refill  . acetaZOLAMIDE (DIAMOX) 250 MG tablet   Oral   Take 1 tablet (250 mg total) by mouth 2 (two) times daily.         Marland Kitchen. albuterol (PROVENTIL) (2.5 MG/3ML) 0.083% nebulizer solution   Nebulization   Take 3 mLs (2.5 mg total) by nebulization 3 (three) times daily.   75 mL   12   . antiseptic oral rinse (CPC / CETYLPYRIDINIUM CHLORIDE 0.05%) 0.05 % LIQD solution   Mouth Rinse   7 mLs by Mouth Rinse route 2 times daily at 12 noon and 4 pm.   15 mL   0   . apixaban (ELIQUIS) 5 MG TABS tablet   Oral   Take 1 tablet (5 mg total) by mouth 2 (two) times daily.   60 tablet   0   . bisacodyl (DULCOLAX) 10 MG suppository   Rectal   Place 1 suppository (10 mg total) rectally daily as needed for moderate constipation.   12 suppository   0   . budesonide (PULMICORT) 0.5 MG/2ML nebulizer solution   Nebulization   Take 2 mLs (0.5 mg total) by nebulization 2 (two) times daily.   15 mL   0   . carvedilol (COREG) 6.25 MG tablet   Oral   Take 1 tablet (6.25 mg total) by mouth 2 (two) times daily with  a meal.   60 tablet   0   . chlorhexidine (PERIDEX) 0.12 % solution   Mouth Rinse   15 mLs by Mouth Rinse route 2 (two) times daily.   120 mL   0   . docusate sodium (COLACE) 100 MG capsule   Oral   Take 2 capsules (200 mg total) by mouth 2 (two) times daily.   10 capsule   0   . furosemide (LASIX) 40 MG tablet   Oral   Take 1 tablet (40 mg total) by mouth 2 (two) times daily.   60 tablet   0   . guaiFENesin-codeine 100-10 MG/5ML syrup   Oral   Take 10 mLs by mouth every 6 (six) hours as needed for cough.   120 mL   0   . insulin aspart (NOVOLOG) 100 UNIT/ML injection   Subcutaneous   Inject 0-20 Units into the skin 3 (three) times daily with meals.   10 mL   11   . insulin aspart (NOVOLOG) 100 UNIT/ML injection    Subcutaneous   Inject 0-5 Units into the skin at bedtime.   10 mL   11   . insulin aspart (NOVOLOG) 100 UNIT/ML injection   Subcutaneous   Inject 0-15 Units into the skin 4 (four) times daily -  with meals and at bedtime.   10 mL   11   . insulin glargine (LANTUS) 100 UNIT/ML injection   Subcutaneous   Inject 0.15 mLs (15 Units total) into the skin at bedtime.   10 mL   11   . mupirocin ointment (BACTROBAN) 2 %   Nasal   Place into the nose 2 (two) times daily.   22 g   0   . ondansetron (ZOFRAN) 4 MG tablet   Oral   Take 1 tablet (4 mg total) by mouth every 6 (six) hours as needed for nausea.   20 tablet   0   . potassium chloride SA (K-DUR,KLOR-CON) 20 MEQ tablet   Oral   Take 1 tablet (20 mEq total) by mouth 2 (two) times daily.   60 tablet   0   . sertraline (ZOLOFT) 50 MG tablet   Oral   Take 100 mg by mouth daily.         Marland Kitchen spironolactone (ALDACTONE) 50 MG tablet   Oral   Take 1 tablet (50 mg total) by mouth daily.   30 tablet   0   . sulfamethoxazole-trimethoprim (BACTRIM,SEPTRA) 200-40 MG/5ML suspension   Oral   Take 20 mLs by mouth 2 (two) times daily.   100 mL   0   . theophylline (THEODUR) 200 MG 12 hr tablet   Oral   Take 1 tablet (200 mg total) by mouth every 12 (twelve) hours.         Marland Kitchen tiotropium (SPIRIVA) 18 MCG inhalation capsule   Inhalation   Place 1 capsule (18 mcg total) into inhaler and inhale daily.   30 capsule   12   . traZODone (DESYREL) 100 MG tablet   Oral   Take 100 mg by mouth at bedtime.         Marland Kitchen trimethoprim-polymyxin b (POLYTRIM) ophthalmic solution   Left Eye   Place 2 drops into the left eye every 4 (four) hours.   10 mL   0      Allergies Other; Penicillins; Betadine; and Darvon   Family History  Problem Relation Age of Onset  . CAD Mother   .  Diabetes type II Mother   . Hypertension Father   . CAD Father     Social History Social History  Substance Use Topics  . Smoking status: Never  Smoker   . Smokeless tobacco: Never Used  . Alcohol Use: No    Review of Systems  Constitutional:   No fever or chills.  Eyes:   No vision changes.  ENT:   No sore throat. No rhinorrhea. Cardiovascular:   No chest pain. Respiratory:   Chronic respiratory failure, on 4 L nasal cannula at home. No new shortness of breath or cough. Gastrointestinal:   Generalized abdominal pain with constipation.  Genitourinary:   Negative for dysuria or difficulty urinating. Musculoskeletal:   Negative for focal pain or swelling Neurological:   Negative for headaches 10-point ROS otherwise negative.  ____________________________________________   PHYSICAL EXAM:  VITAL SIGNS: ED Triage Vitals  Enc Vitals Group     BP 08/26/15 1344 112/85 mmHg     Pulse Rate 08/26/15 1344 85     Resp 08/26/15 1344 18     Temp 08/26/15 1344 98.3 F (36.8 C)     Temp Source 08/26/15 1344 Oral     SpO2 08/26/15 1344 96 %     Weight 08/26/15 1344 324 lb (146.965 kg)     Height 08/26/15 1344 4\' 11"  (1.499 m)     Head Cir --      Peak Flow --      Pain Score 08/26/15 1350 3     Pain Loc --      Pain Edu? --      Excl. in GC? --     Vital signs reviewed, nursing assessments reviewed.   Constitutional:   Alert and oriented. Well appearing and in no distress.Morbidly obese Eyes:   No scleral icterus. No conjunctival pallor. PERRL. EOMI.  No nystagmus. ENT   Head:   Normocephalic and atraumatic.   Nose:   No congestion/rhinnorhea. No septal hematoma   Mouth/Throat:   MMM, no pharyngeal erythema. No peritonsillar mass.    Neck:   No stridor. No SubQ emphysema. No meningismus. Hematological/Lymphatic/Immunilogical:   No cervical lymphadenopathy. Cardiovascular:   RRR. Symmetric bilateral radial and DP pulses.  No murmurs.  Respiratory:   Normal respiratory effort without tachypnea nor retractions. Breath sounds are clear and equal bilaterally. No wheezes/rales/rhonchi. Gastrointestinal:   Soft With  significant right lower quadrant tenderness. Non distended.  No rebound, rigidity, or guarding. Genitourinary:   deferred Musculoskeletal:   Nontender with normal range of motion in all extremities. No joint effusions.  No lower extremity tenderness.  No edema. Neurologic:   Normal speech and language.  CN 2-10 normal. Motor grossly intact. No gross focal neurologic deficits are appreciated.  Skin:    Skin is warm, dry and intact. No rash noted.  No petechiae, purpura, or bullae.  ____________________________________________    LABS (pertinent positives/negatives) (all labs ordered are listed, but only abnormal results are displayed) Labs Reviewed  COMPREHENSIVE METABOLIC PANEL  LIPASE, BLOOD  CBC WITH DIFFERENTIAL/PLATELET   ____________________________________________   EKG    ____________________________________________    RADIOLOGY  CT abdomen and pelvis headache  ____________________________________________   PROCEDURES   ____________________________________________   INITIAL IMPRESSION / ASSESSMENT AND PLAN / ED COURSE  Pertinent labs & imaging results that were available during my care of the patient were reviewed by me and considered in my medical decision making (see chart for details).  Patient presents with generalized abdominal pain, constipation, prior surgical  history in the abdomen, significant right lower quadrant tenderness. Denies a prior appendectomy. We'll obtain labs and CT scan to evaluate for appendicitis versus obstruction. Care of the patient be signed out to the oncoming physician at 3:00 PM. Vital signs are unremarkable and normal on the patient's usual 4 L nasal cannula, no evidence of sepsis.     ____________________________________________   FINAL CLINICAL IMPRESSION(S) / ED DIAGNOSES  Final diagnoses:  Generalized abdominal pain       Portions of this note were generated with dragon dictation software. Dictation errors  may occur despite best attempts at proofreading.   Sharman Cheek, MD 08/26/15 [phone]

## 2015-08-26 NOTE — ED Notes (Signed)
Pt to ED from home via EMS with constipation x 7 days.

## 2015-08-26 NOTE — ED Provider Notes (Signed)
-----------------------------------------   3:05 PM on 08/26/2015 -----------------------------------------   Blood pressure 112/85, pulse 85, temperature 98.3 F (36.8 C), temperature source Oral, resp. rate 18, height 4\' 11"  (1.499 m), weight 146.965 kg, SpO2 96 %.  Assuming care from Dr. Scotty CourtStafford.  In short, Eileen [name] is a 70 y.o. female with a chief complaint of Constipation .  Refer to the original H&P for additional details.  The current plan of care is to follow up on CT and social work consultation due to living situation.  ----------------------------------------- 5:25 PM on 08/26/2015 -----------------------------------------  I reviewed the note from the social worker which incorrectly stated that the patient was being admitted.  At this point her workup is unremarkable and her vital signs are normal and there is no indication for admission.  We are checking a urinalysis given her history of Proteus UTIs, but there is still no indication for admission.  I let the ED case manager know for her to pass along to social work that the patient needs placement from the ED.  ----------------------------------------- 11:34 PM on 08/26/2015 -----------------------------------------  Urinalysis normal.  Awaiting placement decision/plan by social work.  Loleta Roseory Mahogany Torrance, MD 08/26/15 [phone]

## 2015-08-26 NOTE — Care Management Note (Signed)
Case Management Note  Patient Details  Name: Eileen [name] MRN: [id number] Date of Birth: Apr 04, 1945  Subjective/Objective:  Call from Max Fickleina Reese [phone] from DSS. The pt. Is bed bound and followed by DSS.They have been trying to get placement for the pt. As her caregiver/ husband had a stroke  And can not care for her at this time. They will follow up with CSW Maralyn SagoSarah once they check their source at [facility] where they believe they may have a bed,.                  Action/Plan:   Expected Discharge Date:                  Expected Discharge Plan:     In-House Referral:     Discharge planning Services     Post Acute Care Choice:    Choice offered to:     DME Arranged:    DME Agency:     HH Arranged:    HH Agency:     Status of Service:     Medicare Important Message Given:    Date Medicare IM Given:    Medicare IM give by:    Date Additional Medicare IM Given:    Additional Medicare Important Message give by:     If discussed at Long Length of Stay Meetings, dates discussed:    Additional Comments:  Berna BueCheryl Eldora Napp, RN 08/26/2015, 1:59 PM

## 2015-08-27 LAB — GLUCOSE, CAPILLARY
GLUCOSE-CAPILLARY: 85 mg/dL (ref 65–99)
Glucose-Capillary: 86 mg/dL (ref 65–99)
Glucose-Capillary: 99 mg/dL (ref 65–99)
Glucose-Capillary: 112 mg/dL — ABNORMAL HIGH (ref 65–99)

## 2015-08-27 MED ORDER — INSULIN ASPART 100 UNIT/ML ~~LOC~~ SOLN
0.0000 [IU] | Freq: Every day | SUBCUTANEOUS | Status: DC
  Filled 2015-08-27: qty 2

## 2015-08-27 MED ORDER — THEOPHYLLINE ER 400 MG PO TB24
400.0000 mg | ORAL_TABLET | Freq: Every day | ORAL | Status: DC
  Administered 2015-08-28 – 2015-08-29 (×2): 400 mg via ORAL
  Filled 2015-08-27 (×3): qty 1

## 2015-08-27 MED ORDER — INSULIN ASPART 100 UNIT/ML ~~LOC~~ SOLN
0.0000 [IU] | Freq: Three times a day (TID) | SUBCUTANEOUS | Status: DC
  Administered 2015-08-28: 2 [IU] via SUBCUTANEOUS

## 2015-08-27 NOTE — Evaluation (Addendum)
Physical Therapy Evaluation Patient Details Name: Eileen MorrowShirley A Hlad MRN: [id number] DOB: 29-Dec-1945 Today's Date: 08/27/2015   History of Present Illness  Patient presented to ED with constipation, had not had a bowel movement in 7-8 days. Currently on 4L via [place].   Clinical Impression  PT consulted for a patient that has largely been total assist in bed mobility and toileting as her primary caregiver (husband) has suffered a CVA and is not anticipated to be able to care for her after discharge. Patient has non-ambulatory for some amount of time, which appears to be at least over a year. She is quite limited with UE mobility at baseline due to pain, and today requires max-total A x2 for rolling due to body habitus, weakness, pain in UEs. She is unsure of when she was last able to sit, and declines any attempt today due to pain. Per previous PT note she required max Ax3 to achieve sitting and required assistance to maintain sitting balance for short period. Patient appears at her recent baseline and it is not likely she can or will progress towards any functional mobility goals given her long history of immobility. Given the change in her social situation, requiring a caregiver 24/7 and having said caregiver unable to provide for her she requires 24 hour assistance. As of right now, no additional PT services outside the hospital are indicated as she is unlikely to progress towards any mobility goals. Instead would recommend patient have a caregiver present for bathing, meal prep, and toileting. Please re-consult PT if any new information regarding her mobility comes to light, however this appears to be her anticipated level of mobility, no acute care PT needs identified, thus PT will sign off.    Follow Up Recommendations Supervision/Assistance - 24 hour    Equipment Recommendations       Recommendations for Other Services  (Social work consult)     Precautions / Restrictions  Precautions Precautions: Fall Restrictions Weight Bearing Restrictions: No      Mobility  Bed Mobility Overal bed mobility: +2 for physical assistance;Needs Assistance Bed Mobility: Rolling Rolling: Total assist;+2 for physical assistance         General bed mobility comments: Patient initially attempts to assist with RUE on bed rail, unable to complete due to pain. She require nearly dependent +2 assist to complete rolling to L side only due to R shoulder pain.   Transfers                    Ambulation/Gait                Stairs            Wheelchair Mobility    Modified Rankin (Stroke Patients Only)       Balance                                             Pertinent Vitals/Pain Pain Assessment: Faces Faces Pain Scale: Hurts little more Pain Location: Neck, bilateral shoulders, back Pain Descriptors / Indicators: Aching Pain Intervention(s): Limited activity within patient's tolerance;Monitored during session;Patient requesting pain meds-RN notified    Home Living Family/patient expects to be discharged to:: Skilled nursing facility Living Arrangements: Spouse/significant other Available Help at Discharge: Family Type of Home: House       Home Layout: One level Home Equipment: Hospital bed;Walker - standard;Wheelchair -  power (bed pan)      Prior Function Level of Independence: Needs assistance   Gait / Transfers Assistance Needed: Non-ambulatory, bed-level for all ADLS, toileting in bed-pan. Per old notes she was able to assist with rolling, though she appears to have declined since that time. Requires assistance from husband for all activities.      Comments: Per previous notes she has not ambulated in at least 1 year, though patient was unable to provide reliable insight into last time she was able to bear weight on LEs (per patient it is excrutiating to bear weight).      Hand Dominance   Dominant Hand:  Right    Extremity/Trunk Assessment   Upper Extremity Assessment: RUE deficits/detail;LUE deficits/detail RUE Deficits / Details: Pain noted at roughly 70-90 degrees of flexion in shoulder. She is able to bring her R hand to her mouth, not to the top of her head.      LUE Deficits / Details: Pain noted at roughly 90 degrees of flexion in shoulder. She is able to bring her L hand to her mouth, not to the top of her head.    Lower Extremity Assessment: RLE deficits/detail;LLE deficits/detail RLE Deficits / Details: Able to perform SLR through 1/2 ROM. Unable to flex knee per patient since a fall at some point in the past. LLE Deficits / Details: Able to complete SLR through roughly 1/4 the anticipated ROM. Reports her LLE was fused? Reports she is unable to flex knee. Able to wiggle toes and DF/PF ankles.     Communication   Communication: No difficulties  Cognition Arousal/Alertness: Awake/alert Behavior During Therapy: Flat affect Overall Cognitive Status: No family/caregiver present to determine baseline cognitive functioning (Patient states she hasn't ambulated since 1976, then 1985, then reports the year is 2019. She claims this Thereasa Parkin was her PT at another facility that this Thereasa Parkin has not worked at. )                      General Comments General comments (skin integrity, edema, etc.): No overt areas of skin breakdown, though areas of increased pressure noted during rolling on posterior trunk.     Exercises        Assessment/Plan    PT Assessment Patent does not need any further PT services  PT Diagnosis     PT Problem List    PT Treatment Interventions     PT Goals (Current goals can be found in the Care Plan section) Acute Rehab PT Goals Patient Stated Goal: Patient did not specify goals  PT Goal Formulation: With patient Time For Goal Achievement: 09/10/15 Potential to Achieve Goals: Poor    Frequency     Barriers to discharge        Co-evaluation                End of Session Equipment Utilized During Treatment: Oxygen Activity Tolerance: Patient limited by fatigue;Patient limited by pain Patient left: in bed;with call bell/phone within reach Nurse Communication: Mobility status         Time: 4098-1191 PT Time Calculation (min) (ACUTE ONLY): 20 min   Charges:   PT Evaluation $PT Eval Moderate Complexity: 1 Procedure     PT G Codes:       Kerin Ransom, PT, DPT    08/27/2015, 6:23 PM  Late addition G-codes  Kerin Ransom, PT, DPT

## 2015-08-27 NOTE — ED Provider Notes (Signed)
  Physical Exam  BP 124/78 mmHg  Pulse 68  Temp(Src) 97.5 F (36.4 C) (Oral)  Resp 20  Ht 4\' 11"  (1.499 m)  Wt 324 lb (146.965 kg)  BMI 65.40 kg/m2  SpO2 93%  Physical Exam  ED Course  Procedures  MDM Care assumed from previous provider. Patient still pending placement. Apparently has a bed at [facility] but owes them some money. Social work still working on Human resources officerpayment plan and placement. Patient placed on home meds by previous provider.   Richardean Canalavid H Yao, MD 08/27/15 [phone]

## 2015-08-27 NOTE — ED Notes (Signed)
Spoke with American ExpressJennifer [name] (patient's Daughter-in-Law).  Phone number [phone].  States that she and her husband are unable to care for patient in the home.  Max Fickleina Reese has been in contact with patient's family.  Family visited with patient.  Visit went well.  Family dynamics WNL.  Patient's daughter in law requests Social Work to contact her in the morning to discuss discharge planning and plan of care.

## 2015-08-27 NOTE — NC FL2 (Signed)
[place] MEDICAID FL2 LEVEL OF CARE SCREENING TOOL     IDENTIFICATION  Patient Name: Eileen [name] Birthdate: Jul 30, 1945 Sex: female Admission Date (Current Location): 08/26/2015  [place] and [place] Number:  Chiropodist and Address:  [facility], [address] [place], [place] [address]      Provider Number: [id number]  Attending Physician Name and Address:  Provider Default, MD  Relative Name and Phone Number:       Current Level of Care: Hospital Recommended Level of Care: Skilled Nursing Facility Prior Approval Number:    Date Approved/Denied:   PASRR Number:  ( [id number] )  Discharge Plan: SNF    Current Diagnoses: Patient Active Problem List   Diagnosis Date Noted  . Respiratory failure (HCC)   . Acute exacerbation of CHF (congestive heart failure) (HCC)   . Pulmonary edema   . Atrial fibrillation with rapid ventricular response (HCC)   . Acute pulmonary edema (HCC) 11/03/2014  . Leucocytosis 11/03/2014  . Elevated troponin 11/03/2014  . Paroxysmal a-fib (HCC) 11/03/2014  . OSA on CPAP 11/03/2014  . COPD (chronic obstructive pulmonary disease) (HCC) 11/03/2014  . DM (diabetes mellitus) (HCC) 11/03/2014  . HTN (hypertension) 11/03/2014  . Acute delirium 09/17/2014  . Acute on chronic respiratory failure with hypoxia (HCC) 09/14/2014  . COPD exacerbation (HCC) 09/14/2014  . Pressure ulcer 09/14/2014    Orientation RESPIRATION BLADDER Height & Weight     Self, Time, Situation, Place  O2 (4 Liters Oxygen ) Continent Weight: (!) 324 lb (146.965 kg) Height:   (149.9 cm)  BEHAVIORAL SYMPTOMS/MOOD NEUROLOGICAL BOWEL NUTRITION STATUS   (none )  (none ) Continent Diet (Regular Diet )  AMBULATORY STATUS COMMUNICATION OF NEEDS Skin   Extensive Assist Verbally Normal                       Personal Care Assistance Level of Assistance  Bathing, Feeding, Dressing Bathing Assistance: Limited assistance Feeding  assistance: Limited assistance Dressing Assistance: Limited assistance     Functional Limitations Info  Sight, Hearing, Speech Sight Info: Adequate Hearing Info: Adequate Speech Info: Adequate    SPECIAL CARE FACTORS FREQUENCY  PT (By licensed PT), OT (By licensed OT)     PT Frequency:  (5) OT Frequency:  (5)            Contractures      Additional Factors Info  Code Status, Allergies, Insulin Sliding Scale, Isolation Precautions Code Status Info:  (Prior ) Allergies Info:  (Other, Penicillins, Betadine, Darvon)   Insulin Sliding Scale Info:  (NovoLog Insulin Injections 3 times per day. ) Isolation Precautions Info:  (History of MRSA)     Current Medications (08/27/2015):  This is the current hospital active medication list Current Facility-Administered Medications  Medication Dose Route Frequency Provider Last Rate Last Dose  . acetaZOLAMIDE (DIAMOX) tablet 250 mg  250 mg Oral BID Loleta Rose, MD   250 mg at 08/27/15 1053  . albuterol (PROVENTIL) (2.5 MG/3ML) 0.083% nebulizer solution 2.5 mg  2.5 mg Nebulization TID PRN Loleta Rose, MD      . antiseptic oral rinse (CPC / CETYLPYRIDINIUM CHLORIDE 0.05%) solution 7 mL  7 mL Mouth Rinse q12n4p Loleta Rose, MD   7 mL at 08/26/15 1909  . apixaban (ELIQUIS) tablet 5 mg  5 mg Oral BID Loleta Rose, MD   5 mg at 08/27/15 1327  . budesonide (PULMICORT) nebulizer solution 0.5 mg  0.5 mg Nebulization  BID Loleta Rose, MD   0.5 mg at 08/26/15 2227  . carvedilol (COREG) tablet 6.25 mg  6.25 mg Oral BID WC Loleta Rose, MD   6.25 mg at 08/27/15 1054  . chlorhexidine (PERIDEX) 0.12 % solution 15 mL  15 mL Mouth Rinse BID Loleta Rose, MD   15 mL at 08/26/15 2022  . docusate sodium (COLACE) capsule 200 mg  200 mg Oral BID Loleta Rose, MD   200 mg at 08/27/15 1328  . furosemide (LASIX) tablet 40 mg  40 mg Oral BID Loleta Rose, MD   40 mg at 08/27/15 1055  . guaiFENesin-codeine 100-10 MG/5ML solution 10 mL  10 mL Oral Q6H PRN Loleta Rose, MD      . insulin aspart (novoLOG) injection 0-15 Units  0-15 Units Subcutaneous TID WC Richardean Canal, MD      . insulin aspart (novoLOG) injection 0-5 Units  0-5 Units Subcutaneous QHS Richardean Canal, MD      . insulin glargine (LANTUS) injection 15 Units  15 Units Subcutaneous QHS Loleta Rose, MD   15 Units at 08/26/15 2102  . mupirocin ointment (BACTROBAN) 2 %   Nasal BID Loleta Rose, MD      . potassium chloride SA (K-DUR,KLOR-CON) CR tablet 20 mEq  20 mEq Oral BID Loleta Rose, MD   20 mEq at 08/27/15 1325  . sertraline (ZOLOFT) tablet 100 mg  100 mg Oral Daily Loleta Rose, MD   100 mg at 08/27/15 1327  . spironolactone (ALDACTONE) tablet 50 mg  50 mg Oral Daily Loleta Rose, MD   50 mg at 08/27/15 1326  . [START ON 08/28/2015] theophylline (UNIPHYL) 400 MG 24 hr tablet 400 mg  400 mg Oral Daily Richardean Canal, MD      . tiotropium [facility]) inhalation capsule 18 mcg  18 mcg Inhalation Daily Loleta Rose, MD   18 mcg at 08/27/15 1330  . traZODone (DESYREL) tablet 100 mg  100 mg Oral QHS Loleta Rose, MD   100 mg at 08/27/15 1325   Current Outpatient Prescriptions  Medication Sig Dispense Refill  . docusate sodium (COLACE) 100 MG capsule Take 2 capsules (200 mg total) by mouth 2 (two) times daily. 10 capsule 0  . furosemide (LASIX) 40 MG tablet Take 1 tablet (40 mg total) by mouth 2 (two) times daily. 60 tablet 0  . insulin glargine (LANTUS) 100 UNIT/ML injection Inject 0.15 mLs (15 Units total) into the skin at bedtime. (Patient taking differently: Inject 12 Units into the skin at bedtime. ) 10 mL 11  . potassium chloride SA (K-DUR,KLOR-CON) 20 MEQ tablet Take 1 tablet (20 mEq total) by mouth 2 (two) times daily. 60 tablet 0  . promethazine (PHENERGAN) 25 MG tablet Take 25 mg by mouth every 4 (four) hours as needed for nausea or vomiting.    . sertraline (ZOLOFT) 50 MG tablet Take 75 mg by mouth at bedtime.    . theophylline (UNIPHYL) 400 MG 24 hr tablet Take 400 mg by mouth daily.    .  traMADol (ULTRAM) 50 MG tablet Take 50 mg by mouth every 4 (four) hours as needed for moderate pain.    . traZODone (DESYREL) 100 MG tablet Take 100 mg by mouth at bedtime.    . Vitamin D, Ergocalciferol, (DRISDOL) 50000 units CAPS capsule Take 50,000 Units by mouth every 7 (seven) days.       Discharge Medications: Please see discharge summary for a list of discharge medications.  Relevant  Imaging Results:  Relevant Lab Results:   Additional Information  (SSN: [id number])  Haig ProphetMorgan, Byanka Landrus G, LCSW

## 2015-08-27 NOTE — Progress Notes (Signed)
Levander Campionina Reece APS worker confirmed that patient does not have Medicaid and she will assist patient with applying for long term care. CSW requested PT consult. [facility] has made a bed offer pending Health Team approval. CSW discussed case with [facility] Health Team case manager and made her aware of above. If Health Team does not approve SNF stay then patient will not have a payer for SNF. CSW will continue to follow and assist as needed.   Jetta LoutBailey Morgan, LCSW [phone]

## 2015-08-27 NOTE — ED Notes (Signed)
Pt assisted on and off of the bedban. Pad under pt changed.

## 2015-08-27 NOTE — ED Provider Notes (Signed)
-----------------------------------------   7:29 AM on 08/27/2015 -----------------------------------------  No events overnight. Patient remains in the emergency department pending clinical social work consult. She will be moved to an inpatient bed for her comfort when she is awake.  Eileen HongJade J Johna Kearl, MD 08/27/15 0730

## 2015-08-28 LAB — GLUCOSE, CAPILLARY
GLUCOSE-CAPILLARY: 98 mg/dL (ref 65–99)
Glucose-Capillary: 146 mg/dL — ABNORMAL HIGH (ref 65–99)
Glucose-Capillary: 105 mg/dL — ABNORMAL HIGH (ref 65–99)
Glucose-Capillary: 118 mg/dL — ABNORMAL HIGH (ref 65–99)

## 2015-08-28 LAB — URINE CULTURE
Culture: NO GROWTH
Special Requests: NORMAL

## 2015-08-28 MED ORDER — POLYETHYLENE GLYCOL 3350 17 G PO PACK
17.0000 g | PACK | Freq: Every day | ORAL | Status: DC
  Administered 2015-08-28: 17 g via ORAL
  Filled 2015-08-28 (×2): qty 1

## 2015-08-28 NOTE — ED Notes (Signed)
Fredric MareBailey, Child psychotherapistsocial worker, called this Charity fundraiserN. Husbands direct phone number is [phone]. [facility].

## 2015-08-28 NOTE — ED Notes (Signed)
Patient resting. Denies any needs at this time. Food and fluids offered.

## 2015-08-28 NOTE — Progress Notes (Signed)
PT evaluated patient on yesterday. Clinical Child psychotherapistocial Worker (CSW) left a Engineer, technical salesvoicemail for [place] Health Team case manager asking her to review chart. It is likely Health Team will not approve patient for SNF. CSW will continue to follow and assist as needed.   Jetta LoutBailey Morgan, LCSW [phone]

## 2015-08-28 NOTE — ED Notes (Signed)
Dietitian at bedside speaking to patient.

## 2015-08-28 NOTE — ED Notes (Signed)
Patient given lunch meal tray

## 2015-08-28 NOTE — Clinical Social Work Placement (Signed)
   CLINICAL SOCIAL WORK PLACEMENT  NOTE  Date:  08/28/2015  Patient Details  Name: Eileen MorrowShirley A Nhan MRN: [id number] Date of Birth: 12-Jan-1946  Clinical Social Work is seeking post-discharge placement for this patient at the Skilled  Nursing Facility level of care (*CSW will initial, date and re-position this form in  chart as items are completed):  Yes   Patient/family provided with [facility] Clinical Social Work Department's list of facilities offering this level of care within the geographic area requested by the patient (or if unable, by the patient's family).  Yes   Patient/family informed of their freedom to choose among providers that offer the needed level of care, that participate in Medicare, Medicaid or managed care program needed by the patient, have an available bed and are willing to accept the patient.  Yes   Patient/family informed of [facility]'s ownership interest in [facility] and [facility], as well as of the fact that they are under no obligation to receive care at these facilities.  PASRR submitted to EDS on       PASRR number received on       Existing PASRR number confirmed on 08/27/15     FL2 transmitted to all facilities in geographic area requested by pt/family on 08/27/15     FL2 transmitted to all facilities within larger geographic area on       Patient informed that his/her managed care company has contracts with or will negotiate with certain facilities, including the following:            Patient/family informed of bed offers received.  Patient chooses bed at       Physician recommends and patient chooses bed at      Patient to be transferred to   on  .  Patient to be transferred to facility by       Patient family notified on   of transfer.  Name of family member notified:        PHYSICIAN       Additional Comment:    _______________________________________________ Haig ProphetMorgan, Geniene List G, LCSW 08/28/2015, 4:03 PM

## 2015-08-28 NOTE — ED Provider Notes (Signed)
-----------------------------------------   2:22 PM on 08/28/2015 -----------------------------------------  I spoke with the patient, she denies any discomfort at this time. Patient has eaten lunch without any difficulty. Patient states she has not had a large bowel movement since last Thursday. I reviewed the CT scan which shows no signs of obstruction. We'll place patient on MiraLAX once daily. Denies any abdominal discomfort at this time. Currently awaiting social worker/case management disposition for placement as the patient cannot go home she does not have appropriate care and she is largely bedbound.  Minna AntisKevin Cing , MD 08/28/15 1423

## 2015-08-28 NOTE — Progress Notes (Signed)
Per [facility] Health Team case manager Health Team MD will not have an answer today and will likely review the case tomorrow.   Jetta LoutBailey Morgan, LCSW [phone]

## 2015-08-28 NOTE — ED Notes (Signed)
Patient given dinner meal tray

## 2015-08-28 NOTE — ED Notes (Signed)
Attempted to call husband x3. No answer at this time.

## 2015-08-28 NOTE — Progress Notes (Signed)
Per [facility] Health Team case manager the case has been sent up to Health Team MD to review regarding SNF authorization.   Jetta LoutBailey Morgan, LCSW [phone]

## 2015-08-28 NOTE — ED Notes (Signed)
Pt assisted on and off of the bedban. Pad under pt changed.  

## 2015-08-28 NOTE — Progress Notes (Signed)
Initial Nutrition Assessment  DOCUMENTATION CODES:   Morbid obesity  INTERVENTION:  -Discussed general healthy nutrition with pt focusing on limiting carbohydrate for glucose control, limiting sodium and increasing fiber and fluid to assist with constipation.  Pt verbalized understanding.  Expect fair compliance. -Recommend heart healthy/carb modified diet secondary to past medical history and current BMI. -Please re-consult RD if further assistance needed.   NUTRITION DIAGNOSIS:   Food and nutrition related knowledge deficit related to chronic illness as evidenced by  (consult for diet education).    GOAL:   Patient will meet greater than or equal to 90% of their needs    MONITOR:   PO intake  REASON FOR ASSESSMENT:   Consult Diet education  ASSESSMENT:      Pt admitted with constipation. Awaiting placement to nursing home  Past Medical History  Diagnosis Date  . COPD (chronic obstructive pulmonary disease) (HCC)   . Arthritis   . Hypertension   . Diabetes mellitus without complication (HCC)   . CHF (congestive heart failure) (HCC)   . Sleep apnea   . Depression    Pt reports good appetite prior to admission, currently eating lunch during visit and tolerating well. Pt reports that she eats mostly fruits and vegetables but family prepares all meals.  Reports drinks water and diet gingerale on regular basis. Reports that she does not use salt on food or eat foods high in sodium  Medications reviewed: lasix, colace, aspart, lantus, KCL Labs reviewed: FSBS 146, 98, 112, 99, 85  Diet Order:  Diet regular Room service appropriate?: Yes; Fluid consistency:: Thin    Last BM:   noted 1 week since last BM per nursing documentation  Height:   Ht Readings from Last 1 Encounters:  08/26/15 4\' 11"  (1.499 m)    Weight:   Wt Readings from Last 1 Encounters:  08/26/15 324 lb (146.965 kg)      BMI:  Body mass index is 65.4 kg/(m^2).  Estimated Nutritional  Needs:   Kcal:  [id number] kcals/d  Protein:  79-95 g/d  Fluid:  1.5-1.8 L/d  EDUCATION NEEDS:   Education needs addressed  Toddrick Sanna B. Freida BusmanAllen, RD, LDN [phone] (pager) Weekend/On-Call pager [phone])

## 2015-08-28 NOTE — ED Notes (Signed)
Dietary called and notified of need of tray. States they should be delivering them now but it may be a little while.

## 2015-08-28 NOTE — ED Notes (Signed)
Fredric MareBailey from social care spoke to this Charity fundraiserN. She states we are waiting to hear back from patients insurance, which will not be until tomorrow.

## 2015-08-28 NOTE — ED Notes (Signed)
Patients sister called. Number left [phone]. Patient attempted to call sister back x3. Phone is ringing busy.

## 2015-08-28 NOTE — ED Notes (Signed)
Dr. Lenard LancePaduchowski following up with social work in regards to patient placement.

## 2015-08-28 NOTE — ED Notes (Addendum)
Patient resting in hospital bed. Patient denies pain at this time. Drink and food offered to patient. Patient declines. Patient states she has been offered a bedpan and is able to urinate but has still not had a BM. Patient states it has been one week since her last BM. Patient is on 4L of O2, which is her home baseline.

## 2015-08-28 NOTE — Clinical Social Work Note (Signed)
Clinical Social Work Assessment  Patient Details  Name: Eileen [name] MRN: [id number] Date of Birth: 02/11/1946  Date of referral:  08/28/15               Reason for consult:  Facility Placement                Permission sought to share information with:  Chartered certified accountant granted to share information::  Yes, Verbal Permission Granted  Name::      [place]::   [place]   Relationship::     Contact Information:     Housing/Transportation Living arrangements for the past 2 months:  [place] of Information:  Patient Patient Interpreter Needed:  None Criminal Activity/Legal Involvement Pertinent to Current Situation/Hospitalization:  No - Comment as needed Significant Relationships:  Adult Children, Spouse Lives with:  Spouse, Adult Children Do you feel safe going back to the place where you live?  No Need for family participation in patient care:  Yes (Comment)  Care giving concerns:  Patient's husband Eileen [name] is her primary caregiver and he is admitted to [facility] right now.    Social Worker assessment / plan:  Patient was brought into the ED because patient's husband Eileen [name] is her caregiver and is admitted to [facility]. Per Eileen [name] APS worker patient requires SNF placement. PT is recommending 24/ supervision. Health Team medical director is reviewing the case. It is unlikely that Health Team will approve SNF. CSW met with patient alone today. Patient was alert and oriented and was laying in the bed. CSW introduced self and explained role of CSW department. Per patient her and her husband were living with their son Eileen [name] and his wife in [place]. Per patient when her husband went to [facility] she had no caregiver because her son and his wife work all the time and cannot take care of her at home. Per patient she receives $1,000 per month in social security and is willing to sign that over that to a SNF.  Per patient she cannot pay privately for SNF and has no assesets like a car or house. Patient is agreeable to SNF search. CSW made patient aware that Health Team will likely not approve and she would have to go to a SNF outside of [facility] that will accept Medicaid pending. Patient verbalized her understanding and is agreeable to SNF search. Patient expressed concern for her husband and reported that she would like to talk with him if she can.   CSW contacted CSW working with patient's husband at [facility]. Husband's [facility] telephone number is [phone]. CSW gave RN patient's husband's telephone number to [facility] so patient can call into the room and speak to her husband.   FL2 complete and faxed out. CSW is waiting on Health Team determination. If Health Team denies SNF then Paediatric nurse has approved a 30 day LOG. CSW will continue to follow and assist as needed.   Employment status:  Disabled (Comment on whether or not currently receiving Disability) Insurance information:  Managed Medicare PT Recommendations:  24 Hour Supervision Information / Referral to community resources:  [place]  Patient/Family's Response to care:  Patient is agreeable to [employer].   Patient/Family's Understanding of and Emotional Response to Diagnosis, Current Treatment, and Prognosis:  Patient expressed concern about her husband. CSW provided emotional support and provided telephone number to husband's room.  Emotional Assessment Appearance:  Appears younger than stated age Attitude/Demeanor/Rapport:    Affect (typically observed):  Accepting, Adaptable, Pleasant Orientation:  Oriented to Self, Oriented to Place, Oriented to  Time, Oriented to Situation Alcohol / Substance use:  Not Applicable Psych involvement (Current and /or in the community):  No (Comment)  Discharge Needs  Concerns to be addressed:  Discharge Planning Concerns Readmission within  the last 30 days:  No Current discharge risk:  Chronically ill, Dependent with Mobility Barriers to Discharge:  Inadequate or no insurance, Unsafe home situation, Ship broker, Family Issues   Elwyn Reach 08/28/2015, 4:05 PM

## 2015-08-28 NOTE — ED Notes (Signed)
Pharmacy called and notified of need of medication. States they will send it up.

## 2015-08-29 DIAGNOSIS — R6889 Other general symptoms and signs: Secondary | ICD-10-CM | POA: Diagnosis not present

## 2015-08-29 DIAGNOSIS — Z7401 Bed confinement status: Secondary | ICD-10-CM | POA: Diagnosis not present

## 2015-08-29 LAB — GLUCOSE, CAPILLARY
GLUCOSE-CAPILLARY: 108 mg/dL — AB (ref 65–99)
Glucose-Capillary: 108 mg/dL — ABNORMAL HIGH (ref 65–99)
Glucose-Capillary: 115 mg/dL — ABNORMAL HIGH (ref 65–99)

## 2015-08-29 MED ORDER — TRAMADOL HCL 50 MG PO TABS: 50.0000 mg | ORAL_TABLET | Freq: Four times a day (QID) | ORAL | Status: AC | PRN

## 2015-08-29 MED ORDER — SERTRALINE HCL 50 MG PO TABS: 50.0000 mg | ORAL_TABLET | Freq: Every day | ORAL | Status: AC

## 2015-08-29 NOTE — ED Provider Notes (Signed)
Labs Reviewed  COMPREHENSIVE METABOLIC PANEL - Abnormal; Notable for the following:    CO2 35 (*)    Glucose, Bld 103 (*)    Calcium 8.5 (*)    Total Protein 6.4 (*)    Albumin 3.0 (*)    ALT 9 (*)    All other components within normal limits  CBC WITH DIFFERENTIAL/PLATELET - Abnormal; Notable for the following:    Hemoglobin 11.7 (*)    RDW 16.0 (*)    Platelets 128 (*)    All other components within normal limits  URINALYSIS COMPLETEWITH MICROSCOPIC ([facility] ONLY) - Abnormal; Notable for the following:    Color, Urine YELLOW (*)    APPearance CLEAR (*)    Specific Gravity, Urine 1.056 (*)    Bacteria, UA RARE (*)    Squamous Epithelial / LPF 0-5 (*)    All other components within normal limits  GLUCOSE, CAPILLARY - Abnormal; Notable for the following:    Glucose-Capillary 120 (*)    All other components within normal limits  GLUCOSE, CAPILLARY - Abnormal; Notable for the following:    Glucose-Capillary 112 (*)    All other components within normal limits  GLUCOSE, CAPILLARY - Abnormal; Notable for the following:    Glucose-Capillary 146 (*)    All other components within normal limits  GLUCOSE, CAPILLARY - Abnormal; Notable for the following:    Glucose-Capillary 105 (*)    All other components within normal limits  GLUCOSE, CAPILLARY - Abnormal; Notable for the following:    Glucose-Capillary 118 (*)    All other components within normal limits  GLUCOSE, CAPILLARY - Abnormal; Notable for the following:    Glucose-Capillary 108 (*)    All other components within normal limits  GLUCOSE, CAPILLARY - Abnormal; Notable for the following:    Glucose-Capillary 115 (*)    All other components within normal limits  GLUCOSE, CAPILLARY - Abnormal; Notable for the following:    Glucose-Capillary 108 (*)    All other components within normal limits  URINE CULTURE  LIPASE, BLOOD  GLUCOSE, CAPILLARY  GLUCOSE, CAPILLARY  GLUCOSE, CAPILLARY  GLUCOSE, CAPILLARY   Filed Vitals:   08/29/15 0500 08/29/15 1159  BP: 96/64 104/58  Pulse: 77 86  Temp: 97.9 F (36.6 C)   Resp: 18 22   Patient is being transferred and has been [facility]. She remains medically stable for transfer.  Emily FilbertJonathan E Williams, MD 08/29/15 [phone]

## 2015-08-29 NOTE — [place] FL2 (Signed)
[place] MEDICAID FL2 LEVEL OF CARE SCREENING TOOL     IDENTIFICATION  Patient Name: Eileen [name] Birthdate: June 20, 1945 Sex: female Admission Date (Current Location): 08/26/2015  [place]   Facility and Address:  [facility], [address], [place], [place] [address]      Provider Number: [id number]  Attending Physician Name and Address:  Provider Default, MD  Relative Name and Phone Number:       Current Level of Care: Hospital Recommended Level of Care: Skilled Nursing Facility Prior Approval Number:    Date Approved/Denied:   PASRR Number:  ( [id number] )  Discharge Plan: SNF    Current Diagnoses: Patient Active Problem List   Diagnosis Date Noted  . Respiratory failure (HCC)   . Acute exacerbation of CHF (congestive heart failure) (HCC)   . Pulmonary edema   . Atrial fibrillation with rapid ventricular response (HCC)   . Acute pulmonary edema (HCC) 11/03/2014  . Leucocytosis 11/03/2014  . Elevated troponin 11/03/2014  . Paroxysmal a-fib (HCC) 11/03/2014  . OSA on CPAP 11/03/2014  . COPD (chronic obstructive pulmonary disease) (HCC) 11/03/2014  . DM (diabetes mellitus) (HCC) 11/03/2014  . HTN (hypertension) 11/03/2014  . Acute delirium 09/17/2014  . Acute on chronic respiratory failure with hypoxia (HCC) 09/14/2014  . COPD exacerbation (HCC) 09/14/2014  . Pressure ulcer 09/14/2014    Orientation RESPIRATION BLADDER Height & Weight     Self, Time, Situation, Place  O2 (4 Liters Oxygen ) Continent Weight: (!) 324 lb (146.965 kg) Height:  4\' 11"  (149.9 cm)  BEHAVIORAL SYMPTOMS/MOOD NEUROLOGICAL BOWEL NUTRITION STATUS   (none )  (none ) Continent Diet (Regular Diet )  AMBULATORY STATUS COMMUNICATION OF NEEDS Skin   Extensive Assist Verbally Normal                       Personal Care Assistance Level of Assistance  Bathing, Feeding, Dressing Bathing Assistance: Limited assistance Feeding  assistance: Limited assistance Dressing Assistance: Limited assistance     Functional Limitations Info  Sight, Hearing, Speech Sight Info: Adequate Hearing Info: Adequate Speech Info: Adequate    SPECIAL CARE FACTORS FREQUENCY  PT (By licensed PT), OT (By licensed OT)  1-2 days per week as needed                 Contractures      Additional Factors Info  Code Status, Allergies, Insulin Sliding Scale, Isolation Precautions Code Status Info:  (Prior ) Allergies Info:  (Other, Penicillins, Betadine, Darvon)   Insulin Sliding Scale Info:  (NovoLog Insulin Injections 3 times per day. ) Isolation Precautions Info:  (History of MRSA)     Current Medications (08/29/2015):  This is the current hospital active medication list Current Facility-Administered Medications  Medication Dose Route Frequency Provider Last Rate Last Dose  . acetaZOLAMIDE (DIAMOX) tablet 250 mg  250 mg Oral BID Loleta Roseory Forbach, MD   250 mg at 08/29/15 0546  . albuterol (PROVENTIL) (2.5 MG/3ML) 0.083% nebulizer solution 2.5 mg  2.5 mg Nebulization TID PRN Loleta Roseory Forbach, MD      . antiseptic oral rinse (CPC / CETYLPYRIDINIUM CHLORIDE 0.05%) solution 7 mL  7 mL Mouth Rinse q12n4p Loleta Roseory Forbach, MD   7 mL at 08/26/15 1909  . apixaban (ELIQUIS) tablet 5 mg  5 mg Oral BID Loleta Roseory Forbach, MD   5 mg at 08/29/15 1117  . budesonide (PULMICORT) nebulizer solution 0.5 mg  0.5 mg Nebulization  BID Loleta Rose, MD   0.5 mg at 08/29/15 1118  . carvedilol (COREG) tablet 6.25 mg  6.25 mg Oral BID WC Loleta Rose, MD   6.25 mg at 08/28/15 1837  . chlorhexidine (PERIDEX) 0.12 % solution 15 mL  15 mL Mouth Rinse BID Loleta Rose, MD   15 mL at 08/28/15 2204  . docusate sodium (COLACE) capsule 200 mg  200 mg Oral BID Loleta Rose, MD   200 mg at 08/29/15 0904  . furosemide (LASIX) tablet 40 mg  40 mg Oral BID Loleta Rose, MD   40 mg at 08/29/15 0904  . guaiFENesin-codeine 100-10 MG/5ML solution 10 mL  10 mL Oral Q6H PRN Loleta Rose, MD       . insulin aspart (novoLOG) injection 0-15 Units  0-15 Units Subcutaneous TID WC Richardean Canal, MD   2 Units at 08/28/15 1307  . insulin aspart (novoLOG) injection 0-5 Units  0-5 Units Subcutaneous QHS Richardean Canal, MD   Stopped at 08/27/15 2228  . mupirocin ointment (BACTROBAN) 2 %   Nasal BID Loleta Rose, MD      . polyethylene glycol (MIRALAX / GLYCOLAX) packet 17 g  17 g Oral Daily Minna Antis, MD   17 g at 08/28/15 1422  . potassium chloride SA (K-DUR,KLOR-CON) CR tablet 20 mEq  20 mEq Oral BID Loleta Rose, MD   20 mEq at 08/29/15 0904  . sertraline (ZOLOFT) tablet 100 mg  100 mg Oral Daily Loleta Rose, MD   100 mg at 08/29/15 0904  . spironolactone (ALDACTONE) tablet 50 mg  50 mg Oral Daily Loleta Rose, MD   50 mg at 08/29/15 0904  . theophylline (UNIPHYL) 400 MG 24 hr tablet 400 mg  400 mg Oral Daily Richardean Canal, MD   400 mg at 08/29/15 1118  . tiotropium (SPIRIVA) inhalation capsule 18 mcg  18 mcg Inhalation Daily Loleta Rose, MD   18 mcg at 08/29/15 1440  . traZODone (DESYREL) tablet 100 mg  100 mg Oral QHS Loleta Rose, MD   100 mg at 08/28/15 2145   Current Outpatient Prescriptions  Medication Sig Dispense Refill  . docusate sodium (COLACE) 100 MG capsule Take 2 capsules (200 mg total) by mouth 2 (two) times daily. 10 capsule 0  . furosemide (LASIX) 40 MG tablet Take 1 tablet (40 mg total) by mouth 2 (two) times daily. 60 tablet 0  . insulin glargine (LANTUS) 100 UNIT/ML injection Inject 0.15 mLs (15 Units total) into the skin at bedtime. (Patient taking differently: Inject 12 Units into the skin at bedtime. ) 10 mL 11  . potassium chloride SA (K-DUR,KLOR-CON) 20 MEQ tablet Take 1 tablet (20 mEq total) by mouth 2 (two) times daily. 60 tablet 0  . promethazine (PHENERGAN) 25 MG tablet Take 25 mg by mouth every 4 (four) hours as needed for nausea or vomiting.    . sertraline (ZOLOFT) 50 MG tablet Take 75 mg by mouth at bedtime.    . theophylline (UNIPHYL) 400 MG 24 hr tablet  Take 400 mg by mouth daily.    . traMADol (ULTRAM) 50 MG tablet Take 50 mg by mouth every 4 (four) hours as needed for moderate pain.    . traZODone (DESYREL) 100 MG tablet Take 100 mg by mouth at bedtime.    . Vitamin D, Ergocalciferol, (DRISDOL) 50000 units CAPS capsule Take 50,000 Units by mouth every 7 (seven) days.       Discharge Medications: Please see discharge summary for  a list of discharge medications.  Relevant Imaging Results:  Relevant Lab Results:   Additional Information  (SSN: [id number])  Haig ProphetMorgan, Talvin Christianson G, LCSW

## 2015-08-29 NOTE — ED Notes (Signed)
Report called to Sheralyn Boatmanoni at [facility] in [place]. EMS has picked up patient and transported to [place]. Patient belongings sent with EMS and patient.

## 2015-08-29 NOTE — Progress Notes (Signed)
[facility] [place] [facility] patient on a [facility] day LOG. [facility] [place] [facility]. Clinical Social Worker (CSW) contacted patient's son Gerlene BurdockRichard and made him aware of above. Son is agreeable for patient and her husband to [facility] [place]. CSW explained to son that patient's insurance Health Team denied SNF stay because PT recommended no acute PT and Zack assistant clinical social work Interior and spatial designerdirector approved 30 day LOG. Per son he will work with DSS to get patient long term care Medicaid. CSW contacted [facility] APS worker following patient and made her aware of plan. Amy Health Team case manager is aware of above. CSW at [facility] working with patient's husband is aware of above.    [facility] [place] patient can come today to [address]. RN will call report and EMS will provide transport. Patient is aware of above. Please reconsult if future social work needs arise. CSW signing off.   Jetta LoutBailey Morgan, LCSW [phone]

## 2015-08-29 NOTE — Progress Notes (Addendum)
Per Amy Health Team Case Manager patient's case has an impendending denial and the physician has the right to do a peer to peer with the medical director of healthteam. Clinical Social Worker (CSW) contacted MD who reported Dr. Mayford KnifeWilliams is the physician in charge of patient's case now and he will call CSW back. CSW also made RN aware that [facility] [place] is coming to assess patient today to see if she would be appropriate for the facility. CSW will continue to follow and assist as needed.   Dr. Mayford KnifeWilliams called CSW back and is agreeable to do a peer to peer review. Amy Health Team case manager is aware of above and will give health team medical director Dr. Mayford KnifeWilliams contact. Patient is aware of above.   Jetta LoutBailey Morgan, LCSW [phone]

## 2015-08-29 NOTE — [place] FL2 (Signed)
[place] MEDICAID FL2 LEVEL OF CARE SCREENING TOOL     IDENTIFICATION  Patient Name: MICHELENA CULMER Birthdate: [birth year] Sex: female Admission Date (Current Location): 08/26/2015  [place] Number:  [place] and Address:  [facility], [address], [place], [place] [address]      Provider Number: [id number]  Attending Physician Name and Address:  Provider Default, MD  Relative Name and Phone Number:       Current Level of Care: Hospital Recommended Level of Care: Skilled Nursing Facility Prior Approval Number:    Date Approved/Denied:   PASRR Number:  ( [id number] )  Discharge Plan: SNF    Current Diagnoses: Patient Active Problem List   Diagnosis Date Noted  . Respiratory failure (HCC)   . Acute exacerbation of CHF (congestive heart failure) (HCC)   . Pulmonary edema   . Atrial fibrillation with rapid ventricular response (HCC)   . Acute pulmonary edema (HCC) 11/03/2014  . Leucocytosis 11/03/2014  . Elevated troponin 11/03/2014  . Paroxysmal a-fib (HCC) 11/03/2014  . OSA on CPAP 11/03/2014  . COPD (chronic obstructive pulmonary disease) (HCC) 11/03/2014  . DM (diabetes mellitus) (HCC) 11/03/2014  . HTN (hypertension) 11/03/2014  . Acute delirium 09/17/2014  . Acute on chronic respiratory failure with hypoxia (HCC) 09/14/2014  . COPD exacerbation (HCC) 09/14/2014  . Pressure ulcer 09/14/2014    Orientation RESPIRATION BLADDER Height & Weight     Self, Time, Situation, Place  O2 (4 Liters Oxygen ) Continent Weight: (!) 324 lb (146.965 kg) Height:   (149.9 cm)  BEHAVIORAL SYMPTOMS/MOOD NEUROLOGICAL BOWEL NUTRITION STATUS   (none )  (none ) Continent Diet (Regular Diet )  AMBULATORY STATUS COMMUNICATION OF NEEDS Skin   Extensive Assist Verbally Normal                       Personal Care Assistance Level of Assistance  Bathing, Feeding, Dressing Bathing Assistance: Limited assistance Feeding  assistance: Limited assistance Dressing Assistance: Limited assistance     Functional Limitations Info  Sight, Hearing, Speech Sight Info: Adequate Hearing Info: Adequate Speech Info: Adequate    SPECIAL CARE FACTORS FREQUENCY  PT (By licensed PT), OT (By licensed OT)  1-2 times per week as needed.                 Contractures      Additional Factors Info  Code Status, Allergies, Insulin Sliding Scale, Isolation Precautions Code Status Info:  (Prior ) Allergies Info:  (Other, Penicillins, Betadine, Darvon)   Insulin Sliding Scale Info:  (NovoLog Insulin Injections 3 times per day. ) Isolation Precautions Info:  (History of MRSA)     Current Medications (08/29/2015):  This is the current hospital active medication list Current Facility-Administered Medications  Medication Dose Route Frequency Provider Last Rate Last Dose  . acetaZOLAMIDE (DIAMOX) tablet 250 mg  250 mg Oral BID Loleta Rose, MD   250 mg at 08/29/15 0546  . albuterol (PROVENTIL) (2.5 MG/3ML) 0.083% nebulizer solution 2.5 mg  2.5 mg Nebulization TID PRN Loleta Rose, MD      . antiseptic oral rinse (CPC / CETYLPYRIDINIUM CHLORIDE 0.05%) solution 7 mL  7 mL Mouth Rinse q12n4p Loleta Rose, MD   7 mL at 08/26/15 1909  . apixaban (ELIQUIS) tablet 5 mg  5 mg Oral BID Loleta Rose, MD   5 mg at 08/29/15 1117  . budesonide (PULMICORT) nebulizer solution 0.5 mg  0.5 mg Nebulization  BID Loleta Roseory Forbach, MD   0.5 mg at 08/29/15 1118  . carvedilol (COREG) tablet 6.25 mg  6.25 mg Oral BID WC Loleta Roseory Forbach, MD   6.25 mg at 08/28/15 1837  . chlorhexidine (PERIDEX) 0.12 % solution 15 mL  15 mL Mouth Rinse BID Loleta Roseory Forbach, MD   15 mL at 08/28/15 2204  . docusate sodium (COLACE) capsule 200 mg  200 mg Oral BID Loleta Roseory Forbach, MD   200 mg at 08/29/15 0904  . furosemide (LASIX) tablet 40 mg  40 mg Oral BID Loleta Roseory Forbach, MD   40 mg at 08/29/15 0904  . guaiFENesin-codeine 100-10 MG/5ML solution 10 mL  10 mL Oral Q6H PRN Loleta Roseory Forbach,  MD      . insulin aspart (novoLOG) injection 0-15 Units  0-15 Units Subcutaneous TID WC Richardean Canalavid H Yao, MD   2 Units at 08/28/15 1307  . insulin aspart (novoLOG) injection 0-5 Units  0-5 Units Subcutaneous QHS Richardean Canalavid H Yao, MD   Stopped at 08/27/15 2228  . mupirocin ointment (BACTROBAN) 2 %   Nasal BID Loleta Roseory Forbach, MD      . polyethylene glycol (MIRALAX / GLYCOLAX) packet 17 g  17 g Oral Daily Minna AntisKevin Paduchowski, MD   17 g at 08/28/15 1422  . potassium chloride SA (K-DUR,KLOR-CON) CR tablet 20 mEq  20 mEq Oral BID Loleta Roseory Forbach, MD   20 mEq at 08/29/15 0904  . sertraline (ZOLOFT) tablet 100 mg  100 mg Oral Daily Loleta Roseory Forbach, MD   100 mg at 08/29/15 0904  . spironolactone (ALDACTONE) tablet 50 mg  50 mg Oral Daily Loleta Roseory Forbach, MD   50 mg at 08/29/15 0904  . theophylline (UNIPHYL) 400 MG 24 hr tablet 400 mg  400 mg Oral Daily Richardean Canalavid H Yao, MD   400 mg at 08/29/15 1118  . tiotropium (SPIRIVA) inhalation capsule 18 mcg  18 mcg Inhalation Daily Loleta Roseory Forbach, MD   18 mcg at 08/29/15 1440  . traZODone (DESYREL) tablet 100 mg  100 mg Oral QHS Loleta Roseory Forbach, MD   100 mg at 08/28/15 2145   Current Outpatient Prescriptions  Medication Sig Dispense Refill  . docusate sodium (COLACE) 100 MG capsule Take 2 capsules (200 mg total) by mouth 2 (two) times daily. 10 capsule 0  . furosemide (LASIX) 40 MG tablet Take 1 tablet (40 mg total) by mouth 2 (two) times daily. 60 tablet 0  . insulin glargine (LANTUS) 100 UNIT/ML injection Inject 0.15 mLs (15 Units total) into the skin at bedtime. (Patient taking differently: Inject 12 Units into the skin at bedtime. ) 10 mL 11  . potassium chloride SA (K-DUR,KLOR-CON) 20 MEQ tablet Take 1 tablet (20 mEq total) by mouth 2 (two) times daily. 60 tablet 0  . promethazine (PHENERGAN) 25 MG tablet Take 25 mg by mouth every 4 (four) hours as needed for nausea or vomiting.    . sertraline (ZOLOFT) 50 MG tablet Take 75 mg by mouth at bedtime.    . theophylline (UNIPHYL) 400 MG 24 hr  tablet Take 400 mg by mouth daily.    . traMADol (ULTRAM) 50 MG tablet Take 50 mg by mouth every 4 (four) hours as needed for moderate pain.    . traZODone (DESYREL) 100 MG tablet Take 100 mg by mouth at bedtime.    . Vitamin D, Ergocalciferol, (DRISDOL) 50000 units CAPS capsule Take 50,000 Units by mouth every 7 (seven) days.       Discharge Medications: Please see discharge summary for  a list of discharge medications.  Relevant Imaging Results:  Relevant Lab Results:   Additional Information  (SSN: [id number])  Haig ProphetMorgan, Sheriann Newmann G, LCSW

## 2015-08-29 NOTE — Clinical Social Work Placement (Signed)
   CLINICAL SOCIAL WORK PLACEMENT  NOTE  Date:  08/29/2015  Patient Details  Name: Eileen [name] MRN: [id number] Date of Birth: 09-Jan-1946  Clinical Social Work is seeking post-discharge placement for this patient at the Skilled  Nursing Facility level of care (*CSW will initial, date and re-position this form in  chart as items are completed):  Yes   Patient/family provided with [facility] Department's list of facilities offering this level of care within the geographic area requested by the patient (or if unable, by the patient's family).  Yes   Patient/family informed of their freedom to choose among providers that offer the needed level of care, that participate in Medicare, Medicaid or managed care program needed by the patient, have an available bed and are willing to accept the patient.  Yes   Patient/family informed of [facility]'s ownership interest [facility] and [facility], as well as of the fact that they are under no obligation to receive care at these facilities.  PASRR submitted to EDS on       PASRR number received on       Existing PASRR number confirmed on 08/27/15     FL2 transmitted to all facilities in geographic area requested by pt/family on 08/27/15     FL2 transmitted to all facilities within larger geographic area on       Patient informed that his/her managed care company has contracts with or will negotiate with certain facilities, including the following:        Yes   Patient/family informed of bed offers received.  Patient chooses bed at  [facility])     Physician recommends and patient chooses bed at      Patient to be transferred to  [facility] ) on 08/29/15.  Patient to be transferred to facility by  [facility] )     Patient family notified on 08/29/15 of transfer.  Name of family member notified:   (Patient's son Gerlene BurdockRichard is aware of D/C today. )     PHYSICIAN        Additional Comment:    _______________________________________________ Haig ProphetMorgan, Austin Herd G, LCSW 08/29/2015, 3:54 PM

## 2015-08-30 DIAGNOSIS — J441 Chronic obstructive pulmonary disease with (acute) exacerbation: Secondary | ICD-10-CM | POA: Diagnosis not present

## 2015-08-30 DIAGNOSIS — J9611 Chronic respiratory failure with hypoxia: Secondary | ICD-10-CM | POA: Diagnosis not present

## 2015-08-30 DIAGNOSIS — J9612 Chronic respiratory failure with hypercapnia: Secondary | ICD-10-CM | POA: Diagnosis not present

## 2015-08-30 DIAGNOSIS — I482 Chronic atrial fibrillation: Secondary | ICD-10-CM | POA: Diagnosis not present

## 2015-08-30 DIAGNOSIS — I5033 Acute on chronic diastolic (congestive) heart failure: Secondary | ICD-10-CM | POA: Diagnosis not present

## 2015-09-07 DIAGNOSIS — N390 Urinary tract infection, site not specified: Secondary | ICD-10-CM | POA: Diagnosis not present

## 2015-09-07 DIAGNOSIS — R410 Disorientation, unspecified: Secondary | ICD-10-CM | POA: Diagnosis not present

## 2015-10-02 DIAGNOSIS — I10 Essential (primary) hypertension: Secondary | ICD-10-CM | POA: Diagnosis not present

## 2015-10-02 DIAGNOSIS — E119 Type 2 diabetes mellitus without complications: Secondary | ICD-10-CM | POA: Diagnosis not present

## 2015-10-24 NOTE — Progress Notes (Signed)
   08/27/15 1822  PT G-Codes **NOT FOR INPATIENT CLASS**  Functional Assessment Tool Used (Patient report, clinical judgement)  Functional Limitation Mobility: Walking and moving around  Mobility: Walking and Moving Around Current Status (N5621(G8978) CI  Mobility: Walking and Moving Around Discharge Status [phone](G8980) CI   Late addition G codes  Kerin RansomPatrick A McNamara, PT, DPT

## 2015-11-07 DIAGNOSIS — J9611 Chronic respiratory failure with hypoxia: Secondary | ICD-10-CM | POA: Diagnosis not present

## 2015-11-07 DIAGNOSIS — J441 Chronic obstructive pulmonary disease with (acute) exacerbation: Secondary | ICD-10-CM | POA: Diagnosis not present

## 2015-11-07 DIAGNOSIS — I5033 Acute on chronic diastolic (congestive) heart failure: Secondary | ICD-10-CM | POA: Diagnosis not present

## 2015-11-07 DIAGNOSIS — I482 Chronic atrial fibrillation: Secondary | ICD-10-CM | POA: Diagnosis not present

## 2015-11-07 DIAGNOSIS — J9612 Chronic respiratory failure with hypercapnia: Secondary | ICD-10-CM | POA: Diagnosis not present

## 2015-12-11 DIAGNOSIS — E1165 Type 2 diabetes mellitus with hyperglycemia: Secondary | ICD-10-CM | POA: Diagnosis not present

## 2015-12-19 DIAGNOSIS — I5033 Acute on chronic diastolic (congestive) heart failure: Secondary | ICD-10-CM | POA: Diagnosis not present

## 2015-12-19 DIAGNOSIS — I482 Chronic atrial fibrillation: Secondary | ICD-10-CM | POA: Diagnosis not present

## 2015-12-19 DIAGNOSIS — J441 Chronic obstructive pulmonary disease with (acute) exacerbation: Secondary | ICD-10-CM | POA: Diagnosis not present

## 2015-12-25 DIAGNOSIS — E119 Type 2 diabetes mellitus without complications: Secondary | ICD-10-CM | POA: Diagnosis not present

## 2015-12-27 DIAGNOSIS — E1151 Type 2 diabetes mellitus with diabetic peripheral angiopathy without gangrene: Secondary | ICD-10-CM | POA: Diagnosis not present

## 2016-02-03 DIAGNOSIS — J441 Chronic obstructive pulmonary disease with (acute) exacerbation: Secondary | ICD-10-CM | POA: Diagnosis not present

## 2016-02-03 DIAGNOSIS — I482 Chronic atrial fibrillation: Secondary | ICD-10-CM | POA: Diagnosis not present

## 2016-02-03 DIAGNOSIS — I5033 Acute on chronic diastolic (congestive) heart failure: Secondary | ICD-10-CM | POA: Diagnosis not present

## 2016-03-19 DIAGNOSIS — I5033 Acute on chronic diastolic (congestive) heart failure: Secondary | ICD-10-CM | POA: Diagnosis not present

## 2016-03-19 DIAGNOSIS — J441 Chronic obstructive pulmonary disease with (acute) exacerbation: Secondary | ICD-10-CM | POA: Diagnosis not present

## 2016-03-19 DIAGNOSIS — I482 Chronic atrial fibrillation: Secondary | ICD-10-CM | POA: Diagnosis not present

## 2016-03-20 DIAGNOSIS — N390 Urinary tract infection, site not specified: Secondary | ICD-10-CM | POA: Diagnosis not present

## 2016-04-03 DIAGNOSIS — I10 Essential (primary) hypertension: Secondary | ICD-10-CM | POA: Diagnosis not present

## 2016-04-03 DIAGNOSIS — E119 Type 2 diabetes mellitus without complications: Secondary | ICD-10-CM | POA: Diagnosis not present

## 2016-04-08 DIAGNOSIS — I509 Heart failure, unspecified: Secondary | ICD-10-CM | POA: Diagnosis not present

## 2016-04-14 DIAGNOSIS — E1151 Type 2 diabetes mellitus with diabetic peripheral angiopathy without gangrene: Secondary | ICD-10-CM | POA: Diagnosis not present

## 2016-04-20 DIAGNOSIS — Z961 Presence of intraocular lens: Secondary | ICD-10-CM | POA: Diagnosis not present

## 2016-04-20 DIAGNOSIS — Z7951 Long term (current) use of inhaled steroids: Secondary | ICD-10-CM | POA: Diagnosis not present

## 2016-04-20 DIAGNOSIS — E119 Type 2 diabetes mellitus without complications: Secondary | ICD-10-CM | POA: Diagnosis not present

## 2016-04-20 DIAGNOSIS — H524 Presbyopia: Secondary | ICD-10-CM | POA: Diagnosis not present

## 2016-04-20 DIAGNOSIS — Z7901 Long term (current) use of anticoagulants: Secondary | ICD-10-CM | POA: Diagnosis not present

## 2016-05-04 DIAGNOSIS — I482 Chronic atrial fibrillation: Secondary | ICD-10-CM | POA: Diagnosis not present

## 2016-05-04 DIAGNOSIS — I5033 Acute on chronic diastolic (congestive) heart failure: Secondary | ICD-10-CM | POA: Diagnosis not present

## 2016-05-04 DIAGNOSIS — J441 Chronic obstructive pulmonary disease with (acute) exacerbation: Secondary | ICD-10-CM | POA: Diagnosis not present

## 2016-06-26 DIAGNOSIS — Z658 Other specified problems related to psychosocial circumstances: Secondary | ICD-10-CM | POA: Diagnosis not present

## 2016-06-26 DIAGNOSIS — F339 Major depressive disorder, recurrent, unspecified: Secondary | ICD-10-CM | POA: Diagnosis not present

## 2016-06-29 DIAGNOSIS — J9600 Acute respiratory failure, unspecified whether with hypoxia or hypercapnia: Secondary | ICD-10-CM | POA: Diagnosis not present

## 2016-06-29 DIAGNOSIS — I5033 Acute on chronic diastolic (congestive) heart failure: Secondary | ICD-10-CM | POA: Diagnosis not present

## 2016-06-29 DIAGNOSIS — J810 Acute pulmonary edema: Secondary | ICD-10-CM | POA: Diagnosis not present

## 2016-06-29 DIAGNOSIS — I4891 Unspecified atrial fibrillation: Secondary | ICD-10-CM | POA: Diagnosis not present

## 2016-07-10 DIAGNOSIS — B351 Tinea unguium: Secondary | ICD-10-CM | POA: Diagnosis not present

## 2016-07-10 DIAGNOSIS — M25572 Pain in left ankle and joints of left foot: Secondary | ICD-10-CM | POA: Diagnosis not present

## 2016-07-10 DIAGNOSIS — M79674 Pain in right toe(s): Secondary | ICD-10-CM | POA: Diagnosis not present

## 2016-07-10 DIAGNOSIS — E1165 Type 2 diabetes mellitus with hyperglycemia: Secondary | ICD-10-CM | POA: Diagnosis not present

## 2016-07-10 DIAGNOSIS — L84 Corns and callosities: Secondary | ICD-10-CM | POA: Diagnosis not present

## 2016-07-24 DIAGNOSIS — F330 Major depressive disorder, recurrent, mild: Secondary | ICD-10-CM | POA: Diagnosis not present

## 2016-07-24 DIAGNOSIS — G4700 Insomnia, unspecified: Secondary | ICD-10-CM | POA: Diagnosis not present

## 2016-07-27 DIAGNOSIS — J810 Acute pulmonary edema: Secondary | ICD-10-CM | POA: Diagnosis not present

## 2016-07-27 DIAGNOSIS — E1165 Type 2 diabetes mellitus with hyperglycemia: Secondary | ICD-10-CM | POA: Diagnosis not present

## 2016-07-27 DIAGNOSIS — I4891 Unspecified atrial fibrillation: Secondary | ICD-10-CM | POA: Diagnosis not present

## 2016-07-27 DIAGNOSIS — I5033 Acute on chronic diastolic (congestive) heart failure: Secondary | ICD-10-CM | POA: Diagnosis not present

## 2016-08-10 DIAGNOSIS — R062 Wheezing: Secondary | ICD-10-CM | POA: Diagnosis not present

## 2016-08-10 DIAGNOSIS — I5032 Chronic diastolic (congestive) heart failure: Secondary | ICD-10-CM | POA: Diagnosis not present

## 2016-08-10 DIAGNOSIS — J449 Chronic obstructive pulmonary disease, unspecified: Secondary | ICD-10-CM | POA: Diagnosis not present

## 2016-08-10 DIAGNOSIS — K5900 Constipation, unspecified: Secondary | ICD-10-CM | POA: Diagnosis not present

## 2016-08-11 DIAGNOSIS — Z79899 Other long term (current) drug therapy: Secondary | ICD-10-CM | POA: Diagnosis not present

## 2016-08-11 DIAGNOSIS — E782 Mixed hyperlipidemia: Secondary | ICD-10-CM | POA: Diagnosis not present

## 2016-08-11 DIAGNOSIS — E119 Type 2 diabetes mellitus without complications: Secondary | ICD-10-CM | POA: Diagnosis not present

## 2016-08-11 DIAGNOSIS — D518 Other vitamin B12 deficiency anemias: Secondary | ICD-10-CM | POA: Diagnosis not present

## 2016-08-22 DIAGNOSIS — R069 Unspecified abnormalities of breathing: Secondary | ICD-10-CM | POA: Diagnosis not present

## 2016-08-22 DIAGNOSIS — J449 Chronic obstructive pulmonary disease, unspecified: Secondary | ICD-10-CM | POA: Diagnosis not present

## 2016-08-22 DIAGNOSIS — E872 Acidosis: Secondary | ICD-10-CM | POA: Diagnosis not present

## 2016-08-22 DIAGNOSIS — Z6841 Body Mass Index (BMI) 40.0 and over, adult: Secondary | ICD-10-CM | POA: Diagnosis not present

## 2016-08-22 DIAGNOSIS — J9602 Acute respiratory failure with hypercapnia: Secondary | ICD-10-CM | POA: Diagnosis not present

## 2016-08-22 DIAGNOSIS — I509 Heart failure, unspecified: Secondary | ICD-10-CM | POA: Diagnosis not present

## 2016-08-22 DIAGNOSIS — D696 Thrombocytopenia, unspecified: Secondary | ICD-10-CM | POA: Diagnosis not present

## 2016-08-22 DIAGNOSIS — E119 Type 2 diabetes mellitus without complications: Secondary | ICD-10-CM | POA: Diagnosis not present

## 2016-08-22 DIAGNOSIS — Z87891 Personal history of nicotine dependence: Secondary | ICD-10-CM | POA: Diagnosis not present

## 2016-08-22 DIAGNOSIS — J189 Pneumonia, unspecified organism: Secondary | ICD-10-CM | POA: Diagnosis not present

## 2016-08-22 DIAGNOSIS — Z515 Encounter for palliative care: Secondary | ICD-10-CM | POA: Diagnosis not present

## 2016-08-22 DIAGNOSIS — N390 Urinary tract infection, site not specified: Secondary | ICD-10-CM | POA: Diagnosis not present

## 2016-08-22 DIAGNOSIS — Z880 Allergy status to penicillin: Secondary | ICD-10-CM | POA: Diagnosis not present

## 2016-08-22 DIAGNOSIS — I4891 Unspecified atrial fibrillation: Secondary | ICD-10-CM | POA: Diagnosis not present

## 2016-08-22 DIAGNOSIS — R0603 Acute respiratory distress: Secondary | ICD-10-CM | POA: Diagnosis not present

## 2016-08-22 DIAGNOSIS — R404 Transient alteration of awareness: Secondary | ICD-10-CM | POA: Diagnosis not present

## 2016-08-22 DIAGNOSIS — Z79899 Other long term (current) drug therapy: Secondary | ICD-10-CM | POA: Diagnosis not present

## 2016-08-22 DIAGNOSIS — Z888 Allergy status to other drugs, medicaments and biological substances status: Secondary | ICD-10-CM | POA: Diagnosis not present

## 2016-08-23 DIAGNOSIS — R279 Unspecified lack of coordination: Secondary | ICD-10-CM | POA: Diagnosis not present

## 2016-08-23 DIAGNOSIS — Z743 Need for continuous supervision: Secondary | ICD-10-CM | POA: Diagnosis not present

## 2016-08-25 DIAGNOSIS — I4891 Unspecified atrial fibrillation: Secondary | ICD-10-CM | POA: Diagnosis not present

## 2016-08-25 DIAGNOSIS — I5033 Acute on chronic diastolic (congestive) heart failure: Secondary | ICD-10-CM | POA: Diagnosis not present

## 2016-08-25 DIAGNOSIS — J810 Acute pulmonary edema: Secondary | ICD-10-CM | POA: Diagnosis not present

## 2016-08-25 DIAGNOSIS — J440 Chronic obstructive pulmonary disease with acute lower respiratory infection: Secondary | ICD-10-CM | POA: Diagnosis not present

## 2016-09-06 DEATH — deceased

## 2018-01-27 IMAGING — CT CT ABD-PELV W/ CM
2 of 5 series · 17 of 46 positions shown, 19 images · IV contrast (iopamidol)
Comparison: None.

CLINICAL DATA: Constipation over the last 8 days. Right-sided
pelvic and groin pain.

EXAM:
CT ABDOMEN AND PELVIS WITH CONTRAST
TECHNIQUE: Multidetector CT imaging of the abdomen and pelvis was performed
using the standard protocol following bolus administration of
intravenous contrast.
CONTRAST:  125mL 0USYEJ-QTT IOPAMIDOL (0USYEJ-QTT) INJECTION 61%

[Series 2: routine abd pel with · axial · 1.27mm/px · z∈[-500,-40]mm · 14 of 104 slices shown, 16 images]
[im 6/104  soft-tissue]
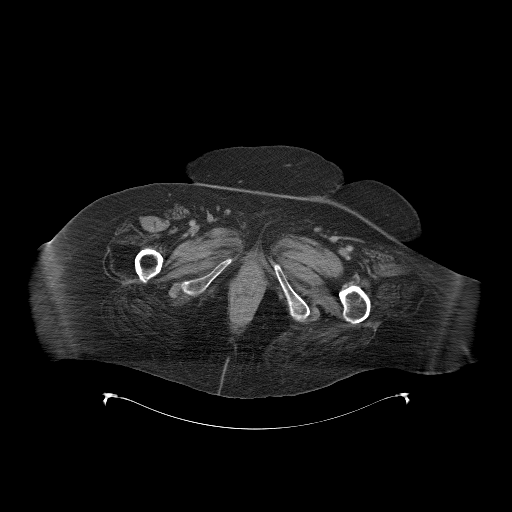
[im 6/104  bone]
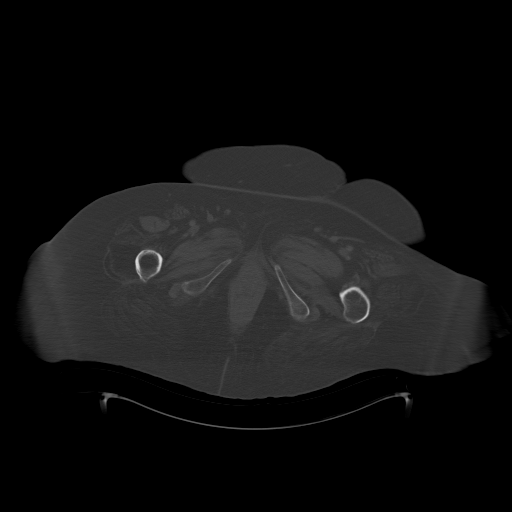
[im 16/104  soft-tissue]
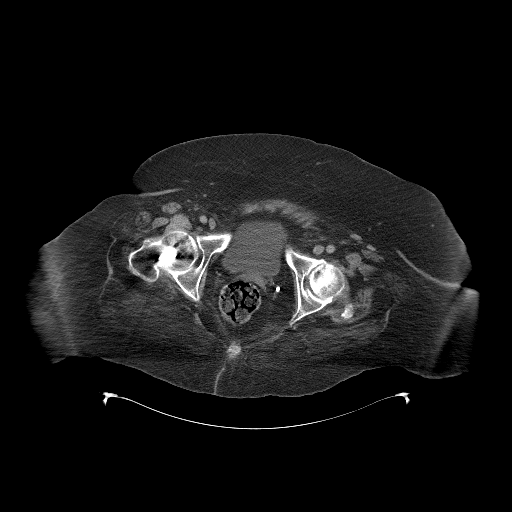
[im 21/104  soft-tissue]
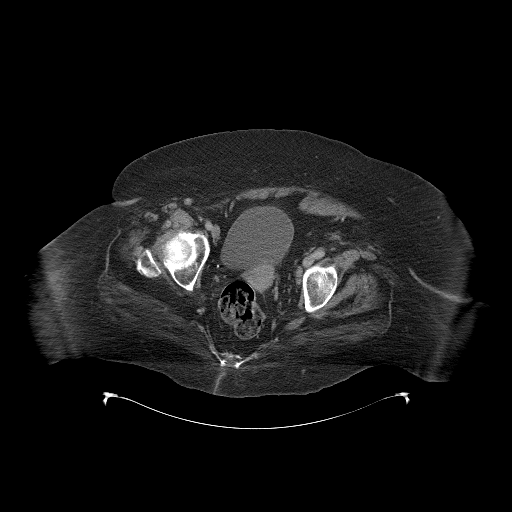
[im 26/104  soft-tissue]
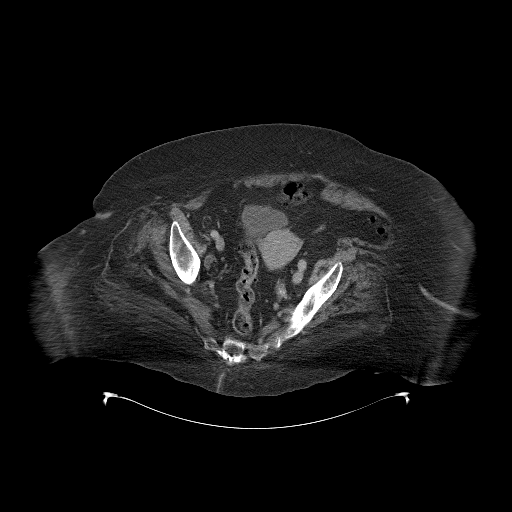
[im 37/104  soft-tissue]
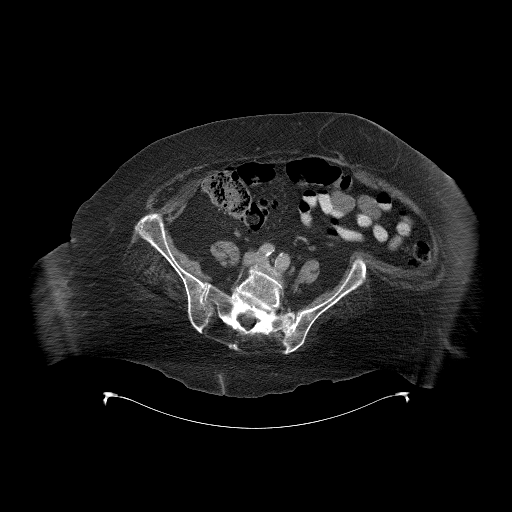
[im 42/104  soft-tissue]
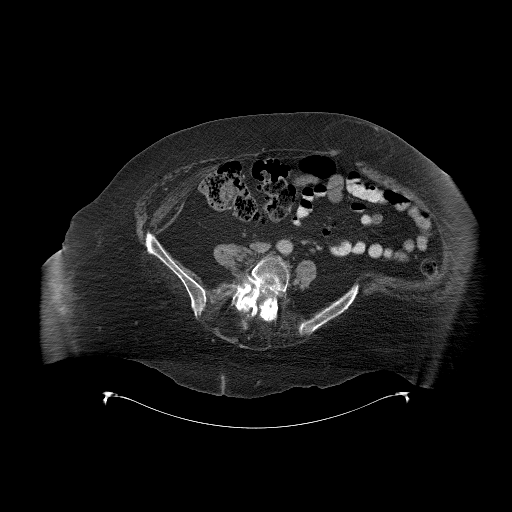
[im 47/104  soft-tissue]
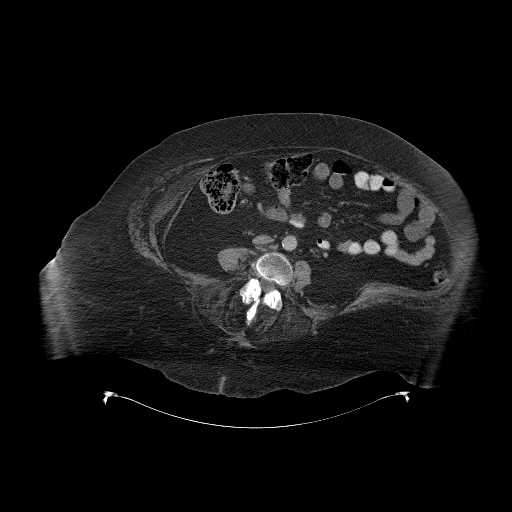
[im 57/104  soft-tissue]
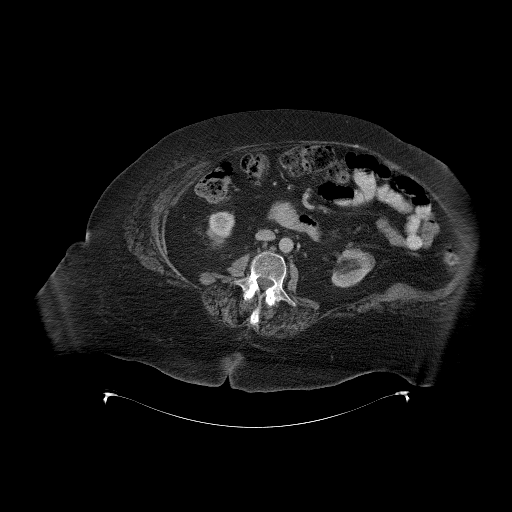
[im 62/104  soft-tissue]
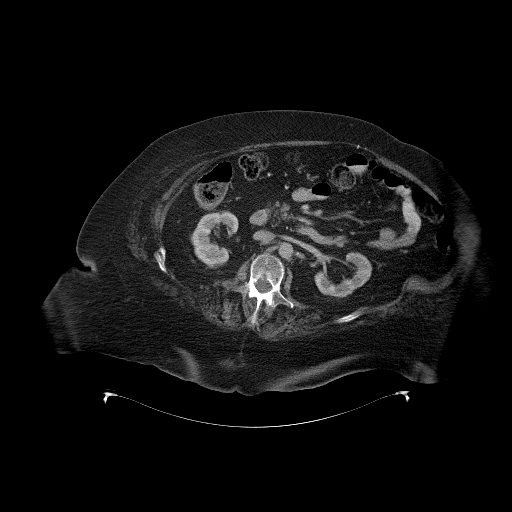
[im 62/104  bone]
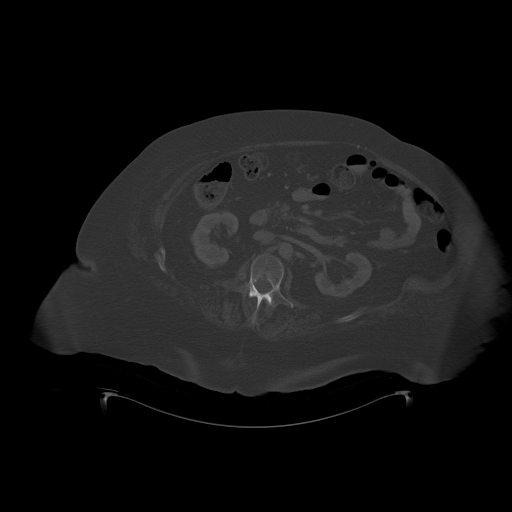
[im 67/104  soft-tissue]
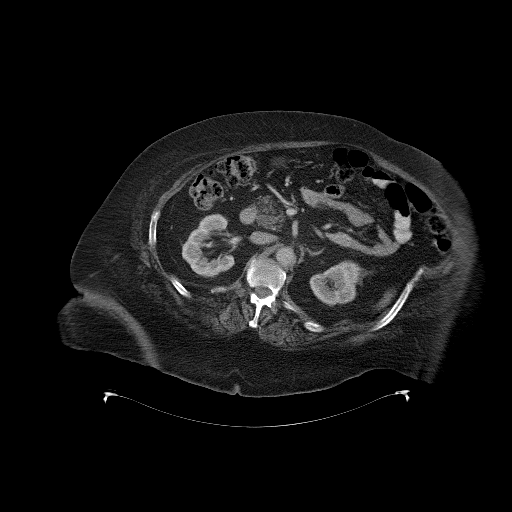
[im 78/104  soft-tissue]
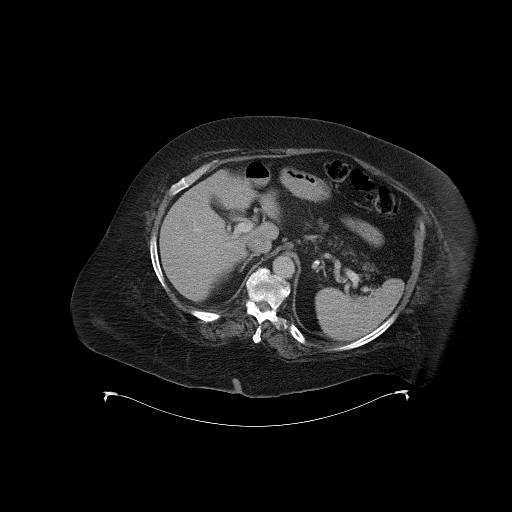
[im 83/104  soft-tissue]
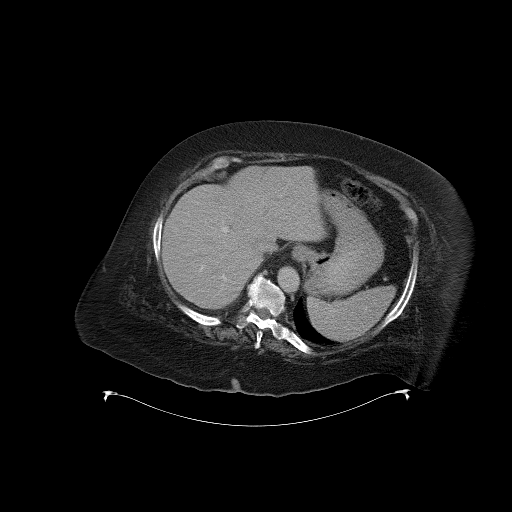
[im 88/104  soft-tissue]
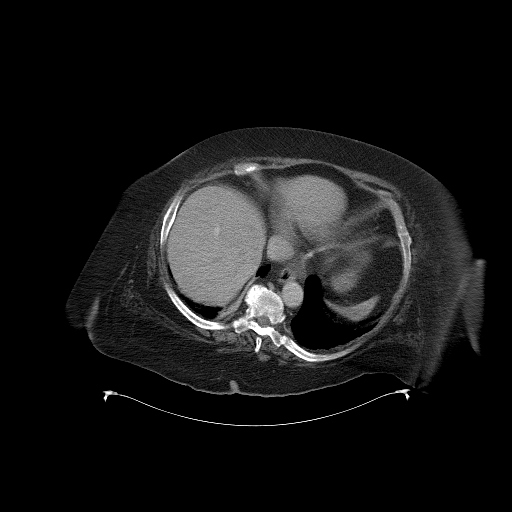
[im 98/104  soft-tissue]
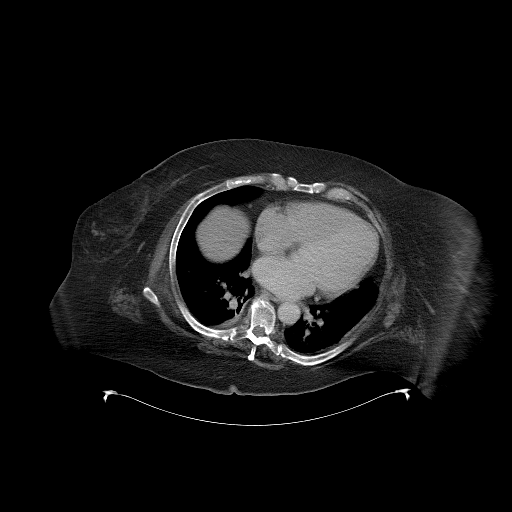

[Series 602: cor · coronal · 1.27mm/px · 3 of 152 slices shown]
[im 51/152  soft-tissue]
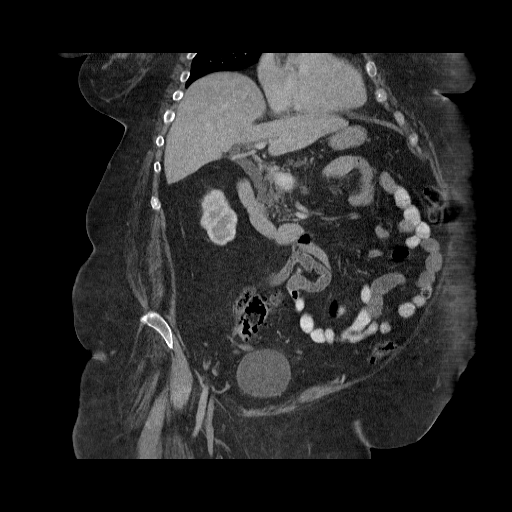
[im 68/152  soft-tissue]
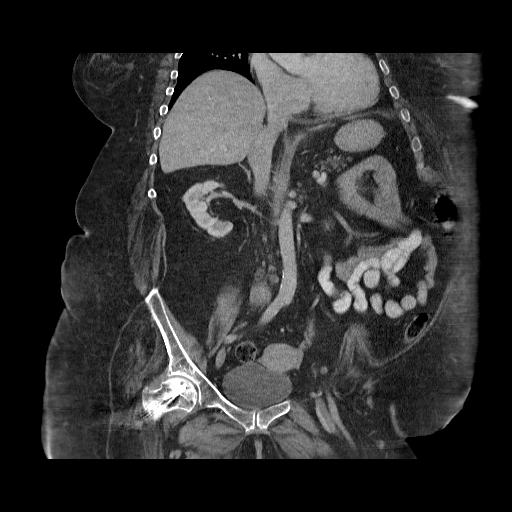
[im 84/152  soft-tissue]
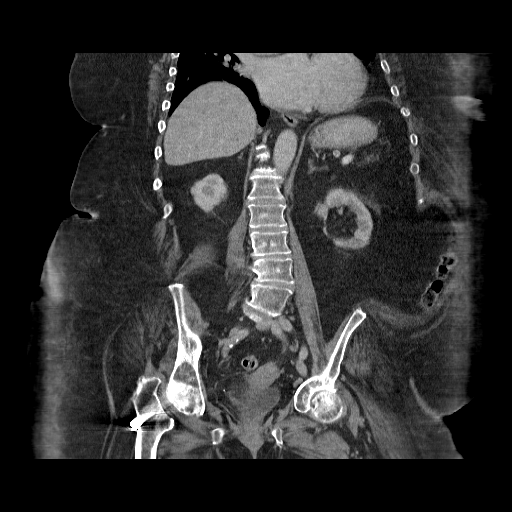

[17 of 46 positions shown; findings below may reference images not displayed]

FINDINGS: There is atelectasis or scarring of both lung bases. No pleural or
pericardial fluid.

The liver has a normal appearance. Previous cholecystectomy. The
spleen is normal. The pancreas is normal. The adrenal glands are
normal. The kidneys are normal. No cyst, mass, stone or
hydronephrosis. The aorta and IVC are normal. No retroperitoneal
mass or lymphadenopathy. There is a ventral hernia containing only
fat. The defect measures approximately 2.2 cm in diameter and the
herniated fat itself measures approximately 8 cm in diameter. No
herniated bowel. Bladder, uterus and adnexal regions appear normal.
No evidence of acute bowel pathology. No inguinal hernia. Previous
ORIF for right femoral neck fracture. There is probably some
avascular necrosis of the femoral head with some degenerative change
of the joint. Lumbar spine shows curvature and degenerative change.
IMPRESSION: No acute abdominal finding. No organ pathology. Previous
cholecystectomy.

Atelectasis or scarring at the lung bases.

Ventral hernia containing only fat.

Lumbar curvature and degenerative disease. Previous ORIF right hip
with possible AV and some degenerative change.
